# Patient Record
Sex: Female | Born: 1970 | Race: White | Hispanic: No | Marital: Married | Smoking: Never smoker
Health system: Southern US, Community
[De-identification: ages and names within clinical notes are randomized; demographics above are authoritative.]

## PROBLEM LIST (undated history)

## (undated) DIAGNOSIS — F419 Anxiety disorder, unspecified: Secondary | ICD-10-CM

## (undated) DIAGNOSIS — W540XXA Bitten by dog, initial encounter: Secondary | ICD-10-CM

## (undated) DIAGNOSIS — F329 Major depressive disorder, single episode, unspecified: Secondary | ICD-10-CM

## (undated) DIAGNOSIS — J302 Other seasonal allergic rhinitis: Secondary | ICD-10-CM

## (undated) DIAGNOSIS — Z87442 Personal history of urinary calculi: Secondary | ICD-10-CM

## (undated) DIAGNOSIS — E282 Polycystic ovarian syndrome: Secondary | ICD-10-CM

## (undated) DIAGNOSIS — E785 Hyperlipidemia, unspecified: Secondary | ICD-10-CM

## (undated) DIAGNOSIS — M1990 Unspecified osteoarthritis, unspecified site: Secondary | ICD-10-CM

## (undated) DIAGNOSIS — E119 Type 2 diabetes mellitus without complications: Secondary | ICD-10-CM

## (undated) DIAGNOSIS — I10 Essential (primary) hypertension: Secondary | ICD-10-CM

## (undated) DIAGNOSIS — F32A Depression, unspecified: Secondary | ICD-10-CM

## (undated) HISTORY — PX: WISDOM TOOTH EXTRACTION: SHX21

## (undated) HISTORY — DX: Anxiety disorder, unspecified: F41.9

## (undated) HISTORY — DX: Essential (primary) hypertension: I10

## (undated) HISTORY — DX: Type 2 diabetes mellitus without complications: E11.9

## (undated) HISTORY — DX: Hyperlipidemia, unspecified: E78.5

---
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
## 2008-05-09 HISTORY — PX: VAGINA SURGERY: SHX829

## 2014-05-13 DIAGNOSIS — M5430 Sciatica, unspecified side: Secondary | ICD-10-CM | POA: Insufficient documentation

## 2014-06-16 DIAGNOSIS — F339 Major depressive disorder, recurrent, unspecified: Secondary | ICD-10-CM | POA: Insufficient documentation

## 2014-08-22 DIAGNOSIS — E1169 Type 2 diabetes mellitus with other specified complication: Secondary | ICD-10-CM | POA: Insufficient documentation

## 2014-08-22 DIAGNOSIS — E785 Hyperlipidemia, unspecified: Secondary | ICD-10-CM

## 2016-07-21 MED FILL — metFORMIN HCL 850 MG TABS: 850 | 90 days supply | Qty: 180 | Fill #0

## 2016-07-22 DIAGNOSIS — E1169 Type 2 diabetes mellitus with other specified complication: Secondary | ICD-10-CM | POA: Diagnosis not present

## 2016-07-22 DIAGNOSIS — E785 Hyperlipidemia, unspecified: Secondary | ICD-10-CM | POA: Diagnosis not present

## 2016-07-22 DIAGNOSIS — E119 Type 2 diabetes mellitus without complications: Secondary | ICD-10-CM | POA: Diagnosis not present

## 2016-07-27 DIAGNOSIS — E1169 Type 2 diabetes mellitus with other specified complication: Secondary | ICD-10-CM | POA: Diagnosis not present

## 2016-07-27 DIAGNOSIS — I10 Essential (primary) hypertension: Secondary | ICD-10-CM | POA: Diagnosis not present

## 2016-07-27 DIAGNOSIS — E785 Hyperlipidemia, unspecified: Secondary | ICD-10-CM | POA: Diagnosis not present

## 2016-07-27 DIAGNOSIS — E119 Type 2 diabetes mellitus without complications: Secondary | ICD-10-CM | POA: Diagnosis not present

## 2016-07-27 DIAGNOSIS — J329 Chronic sinusitis, unspecified: Secondary | ICD-10-CM | POA: Diagnosis not present

## 2016-07-27 DIAGNOSIS — F339 Major depressive disorder, recurrent, unspecified: Secondary | ICD-10-CM | POA: Diagnosis not present

## 2016-07-27 MED FILL — LORazepam 0.5 MG TABS: 0.5 | 10 days supply | Qty: 30 | Fill #0

## 2016-08-08 MED FILL — MELOXICAM 15 MG TABLET: 15 | 90 days supply | Qty: 90 | Fill #0

## 2016-08-22 MED FILL — ATORVASTATIN 20 MG TABLET: 20 | 90 days supply | Qty: 90 | Fill #0

## 2016-09-13 MED FILL — LISINOPRIL 10 MG TABLET: 10 | 90 days supply | Qty: 90 | Fill #0

## 2016-09-13 MED FILL — glipiZIDE 10 MG TABS: 10 | 90 days supply | Qty: 180 | Fill #0

## 2016-10-31 MED FILL — PARoxetine HCL 20 MG TABS: 20 | 90 days supply | Qty: 90 | Fill #0

## 2016-10-31 MED FILL — metFORMIN HCL 850 MG TABS: 850 | 90 days supply | Qty: 180 | Fill #1

## 2016-11-23 MED FILL — MELOXICAM 15 MG TABLET: 15 | 90 days supply | Qty: 90 | Fill #1

## 2016-11-23 MED FILL — ATORVASTATIN 20 MG TABLET: 20 | 90 days supply | Qty: 90 | Fill #1

## 2017-01-02 MED FILL — LISINOPRIL 10 MG TABLET: 10 | 90 days supply | Qty: 90 | Fill #1

## 2017-01-05 MED FILL — glipiZIDE 10 MG TABS: 10 | 90 days supply | Qty: 180 | Fill #1

## 2017-02-06 DIAGNOSIS — H52223 Regular astigmatism, bilateral: Secondary | ICD-10-CM | POA: Diagnosis not present

## 2017-02-06 DIAGNOSIS — H5213 Myopia, bilateral: Secondary | ICD-10-CM | POA: Diagnosis not present

## 2017-02-06 DIAGNOSIS — H524 Presbyopia: Secondary | ICD-10-CM | POA: Diagnosis not present

## 2017-02-14 MED FILL — metFORMIN HCL 850 MG TABS: 850 | 90 days supply | Qty: 180 | Fill #2

## 2017-02-14 MED FILL — PARoxetine HCL 20 MG TABS: 20 | 90 days supply | Qty: 90 | Fill #1

## 2017-03-07 MED FILL — MELOXICAM 15 MG TABLET: 15 | 90 days supply | Qty: 90 | Fill #2

## 2017-03-22 MED FILL — ATORVASTATIN 20 MG TABLET: 20 | 90 days supply | Qty: 90 | Fill #2

## 2017-04-13 MED FILL — LISINOPRIL 10 MG TABS: 10 | 90 days supply | Qty: 90 | Fill #2

## 2017-04-21 MED FILL — glipiZIDE 10 MG TABS: 10 | 90 days supply | Qty: 180 | Fill #2

## 2017-05-15 MED FILL — metFORMIN HCL 850 MG TABS: 850 | 30 days supply | Qty: 60 | Fill #0

## 2017-05-25 MED FILL — PARoxetine HCL 20 MG TABS: 20 | 90 days supply | Qty: 90 | Fill #2

## 2017-06-06 MED FILL — MELOXICAM 15 MG TABLET: 15 | 90 days supply | Qty: 90 | Fill #3

## 2017-06-13 ENCOUNTER — Ambulatory Visit (INDEPENDENT_AMBULATORY_CARE_PROVIDER_SITE_OTHER): Payer: 59 | Admitting: Family Medicine

## 2017-06-13 ENCOUNTER — Encounter: Payer: Self-pay | Admitting: Family Medicine

## 2017-06-13 ENCOUNTER — Telehealth: Payer: Self-pay | Admitting: Family Medicine

## 2017-06-13 VITALS — BP 132/88 | HR 97 | Ht 65.25 in | Wt 209.9 lb

## 2017-06-13 DIAGNOSIS — Z0000 Encounter for general adult medical examination without abnormal findings: Secondary | ICD-10-CM

## 2017-06-13 DIAGNOSIS — I10 Essential (primary) hypertension: Secondary | ICD-10-CM | POA: Diagnosis not present

## 2017-06-13 DIAGNOSIS — E282 Polycystic ovarian syndrome: Secondary | ICD-10-CM | POA: Diagnosis not present

## 2017-06-13 DIAGNOSIS — R748 Abnormal levels of other serum enzymes: Secondary | ICD-10-CM

## 2017-06-13 DIAGNOSIS — E1159 Type 2 diabetes mellitus with other circulatory complications: Secondary | ICD-10-CM

## 2017-06-13 DIAGNOSIS — E1165 Type 2 diabetes mellitus with hyperglycemia: Secondary | ICD-10-CM | POA: Insufficient documentation

## 2017-06-13 DIAGNOSIS — E559 Vitamin D deficiency, unspecified: Secondary | ICD-10-CM

## 2017-06-13 DIAGNOSIS — F339 Major depressive disorder, recurrent, unspecified: Secondary | ICD-10-CM | POA: Diagnosis not present

## 2017-06-13 DIAGNOSIS — E118 Type 2 diabetes mellitus with unspecified complications: Secondary | ICD-10-CM | POA: Diagnosis not present

## 2017-06-13 DIAGNOSIS — E1169 Type 2 diabetes mellitus with other specified complication: Secondary | ICD-10-CM | POA: Diagnosis not present

## 2017-06-13 DIAGNOSIS — L570 Actinic keratosis: Secondary | ICD-10-CM

## 2017-06-13 DIAGNOSIS — J3089 Other allergic rhinitis: Secondary | ICD-10-CM | POA: Diagnosis not present

## 2017-06-13 DIAGNOSIS — I152 Hypertension secondary to endocrine disorders: Secondary | ICD-10-CM

## 2017-06-13 DIAGNOSIS — M2550 Pain in unspecified joint: Secondary | ICD-10-CM

## 2017-06-13 DIAGNOSIS — E782 Mixed hyperlipidemia: Secondary | ICD-10-CM

## 2017-06-13 DIAGNOSIS — Z8261 Family history of arthritis: Secondary | ICD-10-CM

## 2017-06-13 DIAGNOSIS — X32XXXA Exposure to sunlight, initial encounter: Secondary | ICD-10-CM

## 2017-06-13 NOTE — Telephone Encounter (Signed)
Patient called back about dermatologist preference, she want to see Sydnee Levans at [facility].

## 2017-06-13 NOTE — Patient Instructions (Addendum)
Goals:  for next time when I see you will be:   - you walk the dog for 10 minutes 3 times a day then come back home put the dog in the house and you go walk for an additional 10 minutes yourself 3 times a day for a total of 30 minutes -Check fasting blood sugar at least 3 days a week especially after you eat poorly the night before and 3 days/week check 2-hour postprandial after your largest meal the day especially after you eat bad   Please let me know the dermatologist that your mother goes to whom you would like to be referred to.  Call the office back and speak with Melissa my CMA and she can put in that referral for yearly skin screenings, AK\SK on face.     Risk factors for type 2 diabetes  Researchers don't fully understand why some people develop prediabetes and type 2 diabetes and others don't.  It's clear that certain factors increase the risk, however, including:  Weight. The more fatty tissue you have, the more resistant your cells become to insulin.  Inactivity. The less active you are, the greater your risk. Physical activity helps you control your weight, uses up glucose as energy and makes your cells more sensitive to insulin.  Family history. Your risk increases if a parent or sibling has type 2 diabetes.  Race. Although it's unclear why, people of certain races - including blacks, Hispanics, American Indians and Asian-Americans - are at higher risk.  Age. Your risk increases as you get older. This may be because you tend to exercise less, lose muscle mass and gain weight as you age. But type 2 diabetes is also increasing dramatically among children, adolescents and younger adults.  Gestational diabetes. If you developed gestational diabetes when you were pregnant, your risk of developing prediabetes and type 2 diabetes later increases. If you gave birth to a baby weighing more than 9 pounds (4 kilograms), you're also at risk of type 2 diabetes.  Polycystic ovary syndrome. For  women, having polycystic ovary syndrome - a common condition characterized by irregular menstrual periods, excess hair growth and obesity - increases the risk of diabetes.  High blood pressure. Having blood pressure over 140/90 millimeters of mercury (mm Hg) is linked to an increased risk of type 2 diabetes.  Abnormal cholesterol and triglyceride levels. If you have low levels of high-density lipoprotein (HDL), or "good," cholesterol, your risk of type 2 diabetes is higher. Triglycerides are another type of fat carried in the blood. People with high levels of triglycerides have an increased risk of type 2 diabetes. Your doctor can let you know what your cholesterol and triglyceride levels are.  A good guide to good carbs: The glycemic index ---If you have diabetes, or at risk for diabetes, you know all too well that when you eat carbohydrates, your blood sugar goes up. The total amount of carbs you consume at a meal or in a snack mostly determines what your blood sugar will do. But the food itself also plays a role. A serving of white rice has almost the same effect as eating pure table sugar - a quick, high spike in blood sugar. A serving of lentils has a slower, smaller effect.  ---Picking good sources of carbs can help you control your blood sugar and your weight. Even if you don't have diabetes, eating healthier carbohydrate-rich foods can help ward off a host of chronic conditions, from heart disease to various cancers  to, well, diabetes.  ---One way to choose foods is with the glycemic index (GI). This tool measures how much a food boosts blood sugar.  The glycemic index rates the effect of a specific amount of a food on blood sugar compared with the same amount of pure glucose. A food with a glycemic index of 28 boosts blood sugar only 28% as much as pure glucose. One with a GI of 95 acts like pure glucose.    High glycemic foods result in a quick spike in insulin and blood sugar (also known as  blood glucose).  Low glycemic foods have a slower, smaller effect- these are healthier for you.   Using the glycemic index Using the glycemic index is easy: choose foods in the low GI category instead of those in the high GI category (see below), and go easy on those in between. Low glycemic index (GI of 55 or less): Most fruits and vegetables, beans, minimally processed grains, pasta, low-fat dairy foods, and nuts.  Moderate glycemic index (GI 56 to 69): White and sweet potatoes, corn, white rice, couscous, breakfast cereals such as Cream of Wheat and Mini Wheats.  High glycemic index (GI of 70 or higher): White bread, rice cakes, most crackers, bagels, cakes, doughnuts, croissants, most packaged breakfast cereals. You can see the values for 100 commons foods and get links to more at www.health.CheapToothpicks.si.  Swaps for lowering glycemic index  Instead of this high-glycemic index food Eat this lower-glycemic index food  White rice Brown rice or converted rice  Instant oatmeal Steel-cut oats  Cornflakes Bran flakes  Baked potato Pasta, bulgur  White bread Whole-grain bread  Corn Peas or leafy greens       diabetes Eating Plan  Prediabetes--also called impaired glucose tolerance or impaired fasting glucose--is a condition that causes blood sugar (blood glucose) levels to be higher than normal. Following a healthy diet can help to keep prediabetes under control. It can also help to lower the risk of type 2 diabetes and heart disease, which are increased in people who have prediabetes. Along with regular exercise, a healthy diet:  Promotes weight loss.  Helps to control blood sugar levels.  Helps to improve the way that the body uses insulin.   WHAT DO I NEED TO KNOW ABOUT THIS EATING PLAN?   Use the glycemic index (GI) to plan your meals. The index tells you how quickly a food will raise your blood sugar. Choose low-GI foods. These foods take a longer time to raise blood  sugar.  Pay close attention to the amount of carbohydrates in the food that you eat. Carbohydrates increase blood sugar levels.  Keep track of how many calories you take in. Eating the right amount of calories will help you to achieve a healthy weight. Losing about 7 percent of your starting weight can help to prevent type 2 diabetes.  You may want to follow a Mediterranean diet. This diet includes a lot of vegetables, lean meats or fish, whole grains, fruits, and healthy oils and fats.   WHAT FOODS CAN I EAT?  Grains Whole grains, such as whole-wheat or whole-grain breads, crackers, cereals, and pasta. Unsweetened oatmeal. Bulgur. Barley. Quinoa. Brown rice. Corn or whole-wheat flour tortillas or taco shells. Vegetables Lettuce. Spinach. Peas. Beets. Cauliflower. Cabbage. Broccoli. Carrots. Tomatoes. Squash. Eggplant. Herbs. Peppers. Onions. Cucumbers. Brussels sprouts. Fruits Berries. Bananas. Apples. Oranges. Grapes. Papaya. Mango. Pomegranate. Kiwi. Grapefruit. Cherries. Meats and Other Protein Sources Seafood. Lean meats, such as chicken and [place]  or lean cuts of pork and beef. Tofu. Eggs. Nuts. Beans. Dairy Low-fat or fat-free dairy products, such as yogurt, cottage cheese, and cheese. Beverages Water. Tea. Coffee. Sugar-free or diet soda. Seltzer water. Milk. Milk alternatives, such as soy or almond milk. Condiments Mustard. Relish. Low-fat, low-sugar ketchup. Low-fat, low-sugar barbecue sauce. Low-fat or fat-free mayonnaise. Sweets and Desserts Sugar-free or low-fat pudding. Sugar-free or low-fat ice cream and other frozen treats. Fats and Oils Avocado. Walnuts. Olive oil. The items listed above may not be a complete list of recommended foods or beverages. Contact your dietitian for more options.    WHAT FOODS ARE NOT RECOMMENDED?  Grains Refined white flour and flour products, such as bread, pasta, snack foods, and cereals. Beverages Sweetened drinks, such as sweet iced  tea and soda. Sweets and Desserts Baked goods, such as cake, cupcakes, pastries, cookies, and cheesecake. The items listed above may not be a complete list of foods and beverages to avoid. Contact your dietitian for more information.   This information is not intended to replace advice given to you by your health care provider. Make sure you discuss any questions you have with your health care provider.   Document Released: 09/09/2014 Document Reviewed: 09/09/2014 Elsevier Interactive Patient Education 2016 [employer].  -----------------------------------------------------------------------------------------------------------------------------------------------------------------------------------------  GOALS :  The goals are for the Hgb A1C to be less than 7.0 & blood pressure to be less than 130/80.    It is recommended that all diabetics are educated on and follow a healthy diabetic diet, exercise for 30 minutes 5 times per week (walking, biking, swimming, or machine), monitor blood glucose readings and bring that record with you to be reviewed at your next office visit.     You should be checking fasting blood sugars- especially after you eat poorly or eat really healthy, and also check 2 hour postprandial blood sugars after largest meal of the day.    Write these down and bring in your log at each office visit.    You will need to be seen every 3 months by the provider managing your Diabetes unless told otherwise by that provider.   You will need yearly eye exams from an eye specialist and foot exams to check the nerves of your feet.  Also, your urine should be checked yearly as well to make sure excess protein is not present.   If you are checking your blood pressure at home, please record it and bring it to your next office visit.    Follow the Dietary Approaches to Stop Hypertension (DASH) diet (3 servings of fruit and vegetables daily, whole grains, low sodium, low-fat proteins).   See below.    Lastly, when it comes to your cholesterol, the goal is to have the HDL (good cholesterol) >40, and the LDL (bad cholesterol) <100.   It is recommended that you follow a heart healthy, low saturated and trans-fat diet and exercise for 30 minutes at least 5 times a week.     (( Check out the DASH diet = 1.5 Gram Low Sodium Diet   A 1.5 gram sodium diet restricts the amount of sodium in the diet to no more than 1.5 g or 1500 mg daily.  The American Heart Association recommends Americans over the age of 73 to consume no more than 1500 mg of sodium each day to reduce the risk of developing high blood pressure.  Research also shows that limiting sodium may reduce heart attack and stroke risk.  Many foods contain  sodium for flavor and sometimes as a preservative.  When the amount of sodium in a diet needs to be low, it is important to know what to look for when choosing foods and drinks.  The following includes some information and guidelines to help make it easier for you to adapt to a low sodium diet.    QUICK TIPS  Do not add salt to food.  Avoid convenience items and fast food.  Choose unsalted snack foods.  Buy lower sodium products, often labeled as "lower sodium" or "no salt added."  Check food labels to learn how much sodium is in 1 serving.  When eating at a restaurant, ask that your food be prepared with less salt or none, if possible.    READING FOOD LABELS FOR SODIUM INFORMATION  The nutrition facts label is a good place to find how much sodium is in foods. Look for products with no more than 400 mg of sodium per serving.  Remember that 1.5 g = 1500 mg.  The food label may also list foods as:  Sodium-free: Less than 5 mg in a serving.  Very low sodium: 35 mg or less in a serving.  Low-sodium: 140 mg or less in a serving.  Light in sodium: 50% less sodium in a serving. For example, if a food that usually has 300 mg of sodium is changed to become light in sodium, it will  have 150 mg of sodium.  Reduced sodium: 25% less sodium in a serving. For example, if a food that usually has 400 mg of sodium is changed to reduced sodium, it will have 300 mg of sodium.    CHOOSING FOODS  Grains  Avoid: Salted crackers and snack items. Some cereals, including instant hot cereals. Bread stuffing and biscuit mixes. Seasoned rice or pasta mixes.  Choose: Unsalted snack items. Low-sodium cereals, oats, puffed wheat and rice, shredded wheat. English muffins and bread. Pasta.  Meats  Avoid: Salted, canned, smoked, spiced, pickled meats, including fish and poultry. Bacon, ham, sausage, cold cuts, hot dogs, anchovies.  Choose: Low-sodium canned tuna and salmon. Fresh or frozen meat, poultry, and fish.  Dairy  Avoid: Processed cheese and spreads. Cottage cheese. Buttermilk and condensed milk. Regular cheese.  Choose: Milk. Low-sodium cottage cheese. Yogurt. Sour cream. Low-sodium cheese.  Fruits and Vegetables  Avoid: Regular canned vegetables. Regular canned tomato sauce and paste. Frozen vegetables in sauces. Olives. Angie Fava. Relishes. Sauerkraut.  Choose: Low-sodium canned vegetables. Low-sodium tomato sauce and paste. Frozen or fresh vegetables. Fresh and frozen fruit.  Condiments  Avoid: Canned and packaged gravies. Worcestershire sauce. Tartar sauce. Barbecue sauce. Soy sauce. Steak sauce. Ketchup. Onion, garlic, and table salt. Meat flavorings and tenderizers.  Choose: Fresh and dried herbs and spices. Low-sodium varieties of mustard and ketchup. Lemon juice. Tabasco sauce. Horseradish.    SAMPLE 1.5 GRAM SODIUM MEAL PLAN:   Breakfast / Sodium (mg)  1 cup low-fat milk / 143 mg  1 whole-wheat English muffin / 240 mg  1 tbs heart-healthy margarine / 153 mg  1 hard-boiled egg / 139 mg  1 small orange / 0 mg  Lunch / Sodium (mg)  1 cup raw carrots / 76 mg  2 tbs no salt added peanut butter / 5 mg  2 slices whole-wheat bread / 270 mg  1 tbs jelly / 6 mg   cup red grapes  / 2 mg  Dinner / Sodium (mg)  1 cup whole-wheat pasta / 2 mg  1 cup low-sodium  tomato sauce / 73 mg  3 oz lean ground beef / 57 mg  1 small side salad (1 cup raw spinach leaves,  cup cucumber,  cup yellow bell pepper) with 1 tsp olive oil and 1 tsp red wine vinegar / 25 mg  Snack / Sodium (mg)  1 container low-fat vanilla yogurt / 107 mg  3 graham cracker squares / 127 mg  Nutrient Analysis  Calories: 1745  Protein: 75 g  Carbohydrate: 237 g  Fat: 57 g  Sodium: 1425 mg  Document Released: 04/25/2005 Document Revised: 01/05/2011 Document Reviewed: 07/27/2009  [facility] Patient Information 2012 [place], [place].))

## 2017-06-13 NOTE — Progress Notes (Signed)
New patient office visit note:  Impression and Recommendations:    1. Type 2 diabetes mellitus with complication, without long-term current use of insulin ([place])   2. Hypertension associated with diabetes ([place])   3. Mixed diabetic hyperlipidemia associated with type 2 diabetes mellitus ([place])   4. Depression, recurrent ([place])   5. PCOS (polycystic ovarian syndrome)   6. Vitamin D deficiency   7. Environmental and seasonal allergies   8. Encounter for health maintenance examination in adult   9. Family history of rheumatoid arthritis   10. Arthralgia, unspecified joint- b/l hands and feet   11. SK (solar keratosis)- two on face   12. Elevated liver enzymes- ALT and AST since     Patient stated she prefers to follow-up to discuss  1. Type 2 DM-  Previous A1c was 5.8 on 07-22-2016. A1c was 9 on 05-2014 and 6.4 on 11-2014.  -Goal: keep a1c under 7.0 -Continue meds as prescribed. Will monitor her closely.  -Check fasting blood sugar 3 times a week, especially after eating poorly the night before, also check 3x a week your 2 hour post-prandial, especially after eating poorly. Also check your blood sugar if you have any symptoms.  -Discussed lose-it or myfitness pal apps for tracking your daily food intake.  -Handouts and information provided about the glycemic index of foods.  2. Hypertension with diabetes- Continue taking meds as prescribed. Pt is compliant and tolerating them well. Check your BP at home and better control this at home.  -Goal: keep BP consistently below 130/80s  3. Mixed diabetic hyperlipidemia- Previous labs: 07-23-2016 Total 127, TG 256, HDL 33, VLDL 51, LDL 43.  -Continue taking meds, be sure to take this medication at night instead of in the morning.  4. Depression- Encouraged pt to take better care of herself. Goal: walk the dog for 10 minutes 3x a day, then walk an additional 10 minutes, 3x a day by yourself.  5. PCOS- continue taking meds. Will monitor  closely.  6. Vitamin D deficiency-  Pt is compliant and tolerating this supplement well without complication. -Continue taking supplements as prescribed.  7. Environmental and seasonal allergies- continue taking meds as listed below.  8. Encounter for health maintenance examination in adult- obtain FBW today.  9. FMHx RA: Ambulatory referral giving to rheumatology specialist.  10. Arthralgia- RA-  Pt is requesting ambulatory referral given for future assessment by rheumatology specialist.   11. Solar keratosis- Ambulatory dermatology referral will be given after she determines who the dermatologist her mom sees is. Call the office once you determine this and we will refer you.  12. Elevated LFT: LFT's from 02-2013, AST and ALT were 21 and 22, respectively. Jan 2016 AST was 51 ALT 44. 03-2015, AST 27 and ALT 29. Then most recent, 07-2016, AST 49 and ALT 43. Will discuss this in future OV and see if this correlates with when statins were started.  -FBW and microalbumin urine test obtained today. Follow up in near future to discuss FBW lab results.   -In near future, schedule another, separate appointment to manage chronic care and discuss health care management.   Education and routine counseling performed. Handouts provided.  Orders Placed This Encounter  Procedures  . Comprehensive metabolic panel  . Hemoglobin A1c  . Lipid panel  . TSH  . VITAMIN D 25 Hydroxy (Vit-D Deficiency, Fractures)  . CBC with Differential/Platelet  . Ambulatory referral to Rheumatology  . POCT UA - Microalbumin  No orders of the defined types were placed in this encounter.   Gross side effects, risk and benefits, and alternatives of medications discussed with patient.  Patient is aware that all medications have potential side effects and we are unable to predict every side effect or drug-drug interaction that may occur.  Expresses verbal understanding and consents to current therapy plan and treatment  regimen.  Return for Follow-up in near future to discuss blood work chronic medical problems-diabetes, cholesterol; then .  Please see AVS handed out to patient at the end of our visit for further patient instructions/ counseling done pertaining to today's office visit.    Note: This document was prepared using Dragon voice recognition software and may include unintentional dictation errors.  This document serves as a record of services personally performed by [name], DO. It was created on her behalf by Mayer Masker, a trained medical scribe. The creation of this record is based on the scribe's personal observations and the provider's statements to them.   I have reviewed the above medical documentation for accuracy and completeness and I concur.  [name] 06/19/17 1:33 PM  ----------------------------------------------------------------------------------------------------------------------    Subjective:    Chief complaint:   Chief Complaint  Patient presents with  . Establish Care   HPI: Erin Marquez is a pleasant 47 y.o. female who presents to [place] [facility] today to review their medical history with me and establish care.   I asked the patient to review their chronic problem list with me to ensure everything was updated and accurate.    All recent office visits with other providers, any medical records that patient brought in etc  - I reviewed today.     We asked pt to get [place] their medical records from [facility] providers/ specialists that they had seen within the past 3-5 years- if they are in private practice and/or do not work for [employer], [facility], [place], [place] or [employer] owned practice.  Told them to call their specialists to clarify this if they are not sure.   Other healthcare providers: Dr. Levy Pupa at [facility] was her previous PCP.   Nov 2016 last pap smear was [place] Bailiff, gynecology, at [facility] in  [place], [place].  Personal information She is a stay at home mom. She states in the new year she is going to dedicate more time for herself. She states she does not take care of herself. She exercises 10 minutes a day 3x a day by walking her dog.   PMHx: Derm: She is concerned about a skin condition on her face.  She has a suspected h/o RA and states she has pain in her hands and feet. She has a FMHx of RA. She had a negative RA test done recently. She states she usually takes Mobic for her joint pain. She states she occasionally has difficulty sleeping due to joint pains.  PCOS- she is on metformin for this.  DM2: started new meds in addition to metformin when diagnosed with DM 2 years ago, also she had gestational diabetes in 2006. She does not check her blood sugars at home but has a glucometer. Previous A1C's: Last A1c was 5.8 on 07-22-2016. A1c was 9 on 05-2014 and 6.4 on 11-2014.  Cholesterol  She takes daily lipitor. Previous labs: 07-23-2016 Total 127, TG 256, HDL 33, VLDL 51, LDL 43.   LFT:  LFT's from 02-2013, AST and ALT were 21 and 22, respectively.  Jan 2016 AST was 51 ALT 44. 03-2015, AST 27 and ALT 29. Then most recent, 07-2016, AST 49 and ALT 43.   Wt Readings from Last 3 Encounters:  06/13/17 209 lb 14.4 oz (95.2 kg)   BP Readings from Last 3 Encounters:  06/13/17 132/88   Pulse Readings from Last 3 Encounters:  06/13/17 97   BMI Readings from Last 3 Encounters:  06/13/17 34.66 kg/m    Patient Care Team    Relationship Specialty Notifications Start End  [name], DO PCP - General Family Medicine  06/12/17     Patient Active Problem List   Diagnosis Date Noted  . Type 2 diabetes mellitus with complication, without long-term current use of insulin ([place]) 06/13/2017    Priority: High  . Hypertension associated with diabetes ([place]) 06/13/2017    Priority: High  . Mixed diabetic hyperlipidemia associated with type 2 diabetes mellitus ([place]) 06/13/2017     Priority: High  . Depression, recurrent ([place]) 06/16/2014    Priority: High  . PCOS (polycystic ovarian syndrome) 06/13/2017    Priority: Medium  . Vitamin D deficiency 06/13/2017    Priority: Medium  . Chronic Arthralgias- bilateral hands and feet 06/13/2017    Priority: Medium  . Family history of rheumatoid arthritis 06/13/2017    Priority: Medium  . Environmental and seasonal allergies 06/13/2017    Priority: Low  . SK (solar keratosis)- two on face 06/13/2017    Priority: Low  . Elevated liver enzymes- ALT and AST since  06/13/2017  . Sciatica 05/13/2014     Past Medical History:  Diagnosis Date  . Diabetes mellitus without complication ([place])   . Hyperlipidemia   . Hypertension      Past Medical History:  Diagnosis Date  . Diabetes mellitus without complication ([place])   . Hyperlipidemia   . Hypertension      Past Surgical History:  Procedure Laterality Date  . WISDOM TOOTH EXTRACTION       Family History  Problem Relation Age of Onset  . Hypertension Mother   . Stroke Mother   . Diabetes Father      Social History   Substance and Sexual Activity  Drug Use No     Social History   Substance and Sexual Activity  Alcohol Use Yes  . Alcohol/week: 0.6 oz  . Types: 1 Standard drinks or equivalent per week     Social History   Tobacco Use  Smoking Status Never Smoker  Smokeless Tobacco Never Used     Current Meds  Medication Sig  . acetaminophen (TYLENOL) 650 MG CR tablet Take 1 tablet by mouth 2 (two) times daily.  Marland Kitchen atorvastatin (LIPITOR) 20 MG tablet Take 1 tablet by mouth at bedtime.  . Cholecalciferol (VITAMIN D3) 5000 units CAPS Take 1 capsule by mouth daily.  . fexofenadine (ALLEGRA) 180 MG tablet Take 180 mg by mouth daily.  Marland Kitchen glipiZIDE (GLUCOTROL) 10 MG tablet Take 1 tablet by mouth 2 (two) times daily.  Marland Kitchen lisinopril (PRINIVIL,ZESTRIL) 10 MG tablet Take 1 tablet by mouth daily.  . meloxicam (MOBIC) 15 MG tablet Take 1 tablet by  mouth daily.  . metFORMIN (GLUCOPHAGE) 850 MG tablet Take 1 tablet by mouth 2 (two) times daily.  . Multiple Vitamins-Minerals (WOMENS MULTIVITAMIN PO) Take 1 tablet by mouth 2 (two) times daily.  . norethindrone (AYGESTIN) 5 MG tablet Take 5 mg by mouth daily. For 10 days to start cycle  . Omega-3 Fatty Acids (FISH OIL) 1200 MG CAPS  Take 1 capsule by mouth daily.  Marland Kitchen PARoxetine (PAXIL) 20 MG tablet Take 1 tablet by mouth daily.  . vitamin B-12 (CYANOCOBALAMIN) 100 MCG tablet Take 1 capsule by mouth daily.    Allergies: Codeine and Peanut oil   Review of Systems  Constitutional: Negative for chills, diaphoresis, fever, malaise/fatigue and weight loss.  HENT: Negative for congestion, sore throat and tinnitus.   Eyes: Negative for blurred vision, double vision and photophobia.  Respiratory: Negative for cough and wheezing.   Cardiovascular: Negative for chest pain and palpitations.  Gastrointestinal: Negative for blood in stool, diarrhea, nausea and vomiting.  Genitourinary: Negative for dysuria, frequency and urgency.  Musculoskeletal: Negative for joint pain and myalgias.  Skin: Negative for itching and rash.  Neurological: Negative for dizziness, focal weakness, weakness and headaches.  Endo/Heme/Allergies: Negative for environmental allergies and polydipsia. Does not bruise/bleed easily.  Psychiatric/Behavioral: Negative for depression and memory loss. The patient is not nervous/anxious and does not have insomnia.      Objective:   Blood pressure 132/88, pulse 97, height 5' 5.25" (1.657 m), weight 209 lb 14.4 oz (95.2 kg). Body mass index is 34.66 kg/m. General: Well Developed, well nourished, and in no acute distress.  Neuro: Alert and oriented x3, extra-ocular muscles intact, sensation grossly intact.  HEENT:[place]/AT, PERRLA, neck supple, No carotid bruits Skin: no gross rashes. R eye: small hyperkeratotic 4 mm x 1.5 mm skin lesion, slightly hyperpigmented with dry peeling skin  appearance. R cheek: new slightly hyperkeratotic lesion, dry appearance, 3 mm x 3.5 mm, slightly hyperpigmented. Cardiac: Regular rate and rhythm Respiratory: Essentially clear to auscultation bilaterally. Not using accessory muscles, speaking in full sentences.  Abdominal: not grossly distended Musculoskeletal: Ambulates w/o diff, FROM * 4 ext.  Vasc: less 2 sec cap RF, warm and pink  Psych:  No HI/SI, judgement and insight good, Euthymic mood. Full Affect.    Recent Results (from the past 2160 hour(s))  Comprehensive metabolic panel     Status: Abnormal   Collection Time: 06/13/17  5:35 PM  Result Value Ref Range   Glucose 166 (H) 65 - 99 mg/dL   BUN 14 6 - 24 mg/dL   Creatinine, Ser 0.94 0.57 - 1.00 mg/dL   GFR calc non Af Amer 74 >59 mL/min/1.73   GFR calc Af Amer 85 >59 mL/min/1.73   BUN/Creatinine Ratio 15 9 - 23   Sodium 136 134 - 144 mmol/L   Potassium 4.7 3.5 - 5.2 mmol/L   Chloride 97 96 - 106 mmol/L   CO2 19 (L) 20 - 29 mmol/L   Calcium 9.5 8.7 - 10.2 mg/dL   Total Protein 7.6 6.0 - 8.5 g/dL   Albumin 4.6 3.5 - 5.5 g/dL   Globulin, Total 3.0 1.5 - 4.5 g/dL   Albumin/Globulin Ratio 1.5 1.2 - 2.2   Bilirubin Total 0.8 0.0 - 1.2 mg/dL   Alkaline Phosphatase 43 39 - 117 IU/L   AST 54 (H) 0 - 40 IU/L   ALT 51 (H) 0 - 32 IU/L  Hemoglobin A1c     Status: Abnormal   Collection Time: 06/13/17  5:35 PM  Result Value Ref Range   Hgb A1c MFr Bld 8.7 (H) 4.8 - 5.6 %    Comment:          Prediabetes: 5.7 - 6.4          Diabetes: >6.4          Glycemic control for adults with diabetes: <7.0    Est.  average glucose Bld gHb Est-mCnc 203 mg/dL  Lipid panel     Status: Abnormal   Collection Time: 06/13/17  5:35 PM  Result Value Ref Range   Cholesterol, Total 151 100 - 199 mg/dL   Triglycerides 214 (H) 0 - 149 mg/dL   HDL 40 >39 mg/dL   VLDL Cholesterol Cal 43 (H) 5 - 40 mg/dL   LDL Calculated 68 0 - 99 mg/dL   Chol/HDL Ratio 3.8 0.0 - 4.4 ratio    Comment:                                    T. Chol/HDL Ratio                                             Men  Women                               1/2 Avg.Risk  3.4    3.3                                   Avg.Risk  5.0    4.4                                2X Avg.Risk  9.6    7.1                                3X Avg.Risk 23.4   11.0   TSH     Status: None   Collection Time: 06/13/17  5:35 PM  Result Value Ref Range   TSH 2.020 0.450 - 4.500 uIU/mL  VITAMIN D 25 Hydroxy (Vit-D Deficiency, Fractures)     Status: None   Collection Time: 06/13/17  5:35 PM  Result Value Ref Range   Vit D, 25-Hydroxy 65.4 30.0 - 100.0 ng/mL    Comment: Vitamin D deficiency has been defined by the [place] practice guideline as a level of serum 25-OH vitamin D less than 20 ng/mL (1,2). The Endocrine Society went on to further define vitamin D insufficiency as a level between 21 and 29 ng/mL (2). 1. IOM (Institute of Medicine). 2010. Dietary reference    intakes for calcium and D. [place]: The    [employer]. 2. Holick MF, Binkley Holbrook, Bischoff-Ferrari HA, et al.    Evaluation, treatment, and prevention of vitamin D    deficiency: an Endocrine Society clinical practice    guideline. JCEM. 2011 Jul; 96(7):1911-30.   CBC with Differential/Platelet     Status: Abnormal   Collection Time: 06/13/17  5:35 PM  Result Value Ref Range   WBC 9.0 3.4 - 10.8 x10E3/uL   RBC 4.15 3.77 - 5.28 x10E6/uL   Hemoglobin 13.1 11.1 - 15.9 g/dL   Hematocrit 38.5 34.0 - 46.6 %   MCV 93 79 - 97 fL   MCH 31.6 26.6 - 33.0 pg   MCHC 34.0 31.5 - 35.7 g/dL   RDW 13.2 12.3 - 15.4 %   Platelets 245  150 - 379 x10E3/uL   Neutrophils 46 Not Estab. %   Lymphs 48 Not Estab. %   Monocytes 4 Not Estab. %   Eos 2 Not Estab. %   Basos 0 Not Estab. %   Neutrophils Absolute 4.1 1.4 - 7.0 x10E3/uL   Lymphocytes Absolute 4.2 (H) 0.7 - 3.1 x10E3/uL   Monocytes Absolute 0.4 0.1 - 0.9 x10E3/uL   EOS (ABSOLUTE)  0.2 0.0 - 0.4 x10E3/uL   Basophils Absolute 0.0 0.0 - 0.2 x10E3/uL   Immature Granulocytes 0 Not Estab. %   Immature Grans (Abs) 0.0 0.0 - 0.1 x10E3/uL  POCT UA - Microalbumin     Status: Normal   Collection Time: 06/14/17  5:15 PM  Result Value Ref Range   Microalbumin Ur, POC 10 mg/L   Creatinine, POC 300 mg/dL   Albumin/Creatinine Ratio, Urine, POC <30

## 2017-06-14 ENCOUNTER — Other Ambulatory Visit: Payer: Self-pay

## 2017-06-14 DIAGNOSIS — L570 Actinic keratosis: Secondary | ICD-10-CM

## 2017-06-14 DIAGNOSIS — X32XXXA Exposure to sunlight, initial encounter: Principal | ICD-10-CM

## 2017-06-14 LAB — COMPREHENSIVE METABOLIC PANEL
ALK PHOS: 43 IU/L (ref 39–117)
ALT: 51 IU/L — AB (ref 0–32)
AST: 54 IU/L — AB (ref 0–40)
Albumin/Globulin Ratio: 1.5 (ref 1.2–2.2)
Albumin: 4.6 g/dL (ref 3.5–5.5)
BILIRUBIN TOTAL: 0.8 mg/dL (ref 0.0–1.2)
BUN/Creatinine Ratio: 15 (ref 9–23)
BUN: 14 mg/dL (ref 6–24)
CHLORIDE: 97 mmol/L (ref 96–106)
CO2: 19 mmol/L — ABNORMAL LOW (ref 20–29)
Calcium: 9.5 mg/dL (ref 8.7–10.2)
Creatinine, Ser: 0.94 mg/dL (ref 0.57–1.00)
GFR calc Af Amer: 85 mL/min/{1.73_m2} (ref >59)
GFR calc non Af Amer: 74 mL/min/{1.73_m2} (ref >59)
GLUCOSE: 166 mg/dL — AB (ref 65–99)
Globulin, Total: 3 g/dL (ref 1.5–4.5)
Potassium: 4.7 mmol/L (ref 3.5–5.2)
Sodium: 136 mmol/L (ref 134–144)
TOTAL PROTEIN: 7.6 g/dL (ref 6.0–8.5)

## 2017-06-14 LAB — POCT UA - MICROALBUMIN
CREATININE, POC: 300 mg/dL
MICROALBUMIN (UR) POC: 10 mg/L

## 2017-06-14 LAB — LIPID PANEL
CHOLESTEROL TOTAL: 151 mg/dL (ref 100–199)
Chol/HDL Ratio: 3.8 ratio (ref 0.0–4.4)
HDL: 40 mg/dL (ref >39)
LDL Calculated: 68 mg/dL (ref 0–99)
TRIGLYCERIDES: 214 mg/dL — AB (ref 0–149)
VLDL Cholesterol Cal: 43 mg/dL — ABNORMAL HIGH (ref 5–40)

## 2017-06-14 LAB — CBC WITH DIFFERENTIAL/PLATELET
BASOS ABS: 0 10*3/uL (ref 0.0–0.2)
Basos: 0 %
EOS (ABSOLUTE): 0.2 10*3/uL (ref 0.0–0.4)
EOS: 2 %
HEMATOCRIT: 38.5 % (ref 34.0–46.6)
HEMOGLOBIN: 13.1 g/dL (ref 11.1–15.9)
Immature Grans (Abs): 0 10*3/uL (ref 0.0–0.1)
Immature Granulocytes: 0 %
Lymphocytes Absolute: 4.2 10*3/uL — ABNORMAL HIGH (ref 0.7–3.1)
Lymphs: 48 %
MCH: 31.6 pg (ref 26.6–33.0)
MCHC: 34 g/dL (ref 31.5–35.7)
MCV: 93 fL (ref 79–97)
MONOCYTES: 4 %
Monocytes Absolute: 0.4 10*3/uL (ref 0.1–0.9)
NEUTROS PCT: 46 %
Neutrophils Absolute: 4.1 10*3/uL (ref 1.4–7.0)
Platelets: 245 10*3/uL (ref 150–379)
RBC: 4.15 x10E6/uL (ref 3.77–5.28)
RDW: 13.2 % (ref 12.3–15.4)
WBC: 9 10*3/uL (ref 3.4–10.8)

## 2017-06-14 LAB — HEMOGLOBIN A1C
ESTIMATED AVERAGE GLUCOSE: 203 mg/dL
Hgb A1c MFr Bld: 8.7 % — ABNORMAL HIGH (ref 4.8–5.6)

## 2017-06-14 LAB — TSH: TSH: 2.02 u[IU]/mL (ref 0.450–4.500)

## 2017-06-14 LAB — VITAMIN D 25 HYDROXY (VIT D DEFICIENCY, FRACTURES): Vit D, 25-Hydroxy: 65.4 ng/mL (ref 30.0–100.0)

## 2017-06-14 NOTE — Telephone Encounter (Signed)
Referral placed for patient. MPulliam, CMA/RT(R)  

## 2017-06-14 NOTE — Progress Notes (Signed)
Per Dr. Hershal Coria instruction placed referral to Dermatology - patient's preference to which doctor. MPulliam, CMA/RT(R)

## 2017-06-19 NOTE — Progress Notes (Deleted)
Impression and Recommendations:    1. Type 2 diabetes mellitus with complication, without long-term current use of insulin ([place])   2. Hypertension associated with diabetes ([place])   3. Mixed diabetic hyperlipidemia associated with type 2 diabetes mellitus ([place])   4. Depression, recurrent ([place])   5. PCOS (polycystic ovarian syndrome)   6. Vitamin D deficiency   7. Environmental and seasonal allergies   8. Encounter for health maintenance examination in adult   9. Family history of rheumatoid arthritis   10. Arthralgia, unspecified joint- b/l hands and feet   11. SK (solar keratosis)- two on face   12. Elevated liver enzymes- ALT and AST since      1. ***   Education and routine counseling performed. Handouts provided.  Orders Placed This Encounter  Procedures  . Comprehensive metabolic panel  . Hemoglobin A1c  . Lipid panel  . TSH  . VITAMIN D 25 Hydroxy (Vit-D Deficiency, Fractures)  . CBC with Differential/Platelet  . Ambulatory referral to Rheumatology  . POCT UA - Microalbumin    No orders of the defined types were placed in this encounter.    The patient was counseled, risk factors were discussed, anticipatory guidance given.  Gross side effects, risk and benefits, and alternatives of medications discussed with patient.  Patient is aware that all medications have potential side effects and we are unable to predict every side effect or drug-drug interaction that may occur.  Expresses verbal understanding and consents to current therapy plan and treatment regimen.  Return for Follow-up in near future to discuss blood work chronic medical problems-diabetes, cholesterol; then .  Please see AVS handed out to patient at the end of our visit for further patient instructions/ counseling done pertaining to today's office visit.    Note: This document was prepared using Dragon voice recognition software and may include unintentional dictation errors.    Erin Marquez 06/19/17 1:34 PM   Subjective:    HPI: Erin Marquez is a 47 y.o. female who presents to [place] at [facility] today for follow up for HTN.      HTN:  -  Her blood pressure {HAS HAS NOT:18834} been controlled at home.   - Patient reports good compliance with blood pressure medications  - Denies medication S-E   - Smoking Status noted   - She denies new onset of: chest pain, exercise intolerance, shortness of breath, dizziness, visual changes, headache, lower extremity swelling or claudication.    Last 3 blood pressure readings in our office are as follows: BP Readings from Last 3 Encounters:  06/13/17 132/88    Pulse Readings from Last 3 Encounters:  06/13/17 97    Filed Weights   06/13/17 1124  Weight: 209 lb 14.4 oz (95.2 kg)      Patient Care Team    Relationship Specialty Notifications Start End  Erin Dance, DO PCP - General Family Medicine  06/12/17      Lab Results  Component Value Date   CREATININE 0.94 06/13/2017   BUN 14 06/13/2017   NA 136 06/13/2017   K 4.7 06/13/2017   CL 97 06/13/2017   CO2 19 (L) 06/13/2017    Lab Results  Component Value Date   CHOL 151 06/13/2017    Lab Results  Component Value Date   HDL 40 06/13/2017    Lab Results  Component Value Date   LDLCALC 68 06/13/2017    Lab Results  Component Value  Date   TRIG 214 (H) 06/13/2017    Lab Results  Component Value Date   CHOLHDL 3.8 06/13/2017    No results found for: LDLDIRECT ===================================================================   Patient Active Problem List   Diagnosis Date Noted  . Type 2 diabetes mellitus with complication, without long-term current use of insulin ([place]) 06/13/2017    Priority: High  . Hypertension associated with diabetes ([place]) 06/13/2017    Priority: High  . Mixed diabetic hyperlipidemia associated with type 2 diabetes mellitus ([place]) 06/13/2017    Priority: High  . Depression, recurrent  ([place]) 06/16/2014    Priority: High  . PCOS (polycystic ovarian syndrome) 06/13/2017    Priority: Medium  . Vitamin D deficiency 06/13/2017    Priority: Medium  . Chronic Arthralgias- bilateral hands and feet 06/13/2017    Priority: Medium  . Family history of rheumatoid arthritis 06/13/2017    Priority: Medium  . Environmental and seasonal allergies 06/13/2017    Priority: Low  . SK (solar keratosis)- two on face 06/13/2017    Priority: Low  . Elevated liver enzymes- ALT and AST since  06/13/2017  . Sciatica 05/13/2014     Past Medical History:  Diagnosis Date  . Diabetes mellitus without complication ([place])   . Hyperlipidemia   . Hypertension      Past Surgical History:  Procedure Laterality Date  . WISDOM TOOTH EXTRACTION       Family History  Problem Relation Age of Onset  . Hypertension Mother   . Stroke Mother   . Diabetes Father      Social History   Substance and Sexual Activity  Drug Use No  ,  Social History   Substance and Sexual Activity  Alcohol Use Yes  . Alcohol/week: 0.6 oz  . Types: 1 Standard drinks or equivalent per week  ,  Social History   Tobacco Use  Smoking Status Never Smoker  Smokeless Tobacco Never Used  ,    Current Outpatient Medications on File Prior to Visit  Medication Sig Dispense Refill  . acetaminophen (TYLENOL) 650 MG CR tablet Take 1 tablet by mouth 2 (two) times daily.    Marland Kitchen atorvastatin (LIPITOR) 20 MG tablet Take 1 tablet by mouth at bedtime.  3  . Cholecalciferol (VITAMIN D3) 5000 units CAPS Take 1 capsule by mouth daily.    . fexofenadine (ALLEGRA) 180 MG tablet Take 180 mg by mouth daily.    Marland Kitchen glipiZIDE (GLUCOTROL) 10 MG tablet Take 1 tablet by mouth 2 (two) times daily.    Marland Kitchen lisinopril (PRINIVIL,ZESTRIL) 10 MG tablet Take 1 tablet by mouth daily.  3  . meloxicam (MOBIC) 15 MG tablet Take 1 tablet by mouth daily.  3  . metFORMIN (GLUCOPHAGE) 850 MG tablet Take 1 tablet by mouth 2 (two) times daily.  0  .  Multiple Vitamins-Minerals (WOMENS MULTIVITAMIN PO) Take 1 tablet by mouth 2 (two) times daily.    . norethindrone (AYGESTIN) 5 MG tablet Take 5 mg by mouth daily. For 10 days to start cycle    . Omega-3 Fatty Acids (FISH OIL) 1200 MG CAPS Take 1 capsule by mouth daily.    Marland Kitchen PARoxetine (PAXIL) 20 MG tablet Take 1 tablet by mouth daily.  3  . vitamin B-12 (CYANOCOBALAMIN) 100 MCG tablet Take 1 capsule by mouth daily.     No current facility-administered medications on file prior to visit.      Allergies  Allergen Reactions  . Codeine Nausea Only  . Peanut Oil  Slurred speech     Review of Systems:   General:  Denies fever, chills Optho/Auditory:   Denies visual changes, blurred vision Respiratory:   Denies SOB, cough, wheeze, DIB  Cardiovascular:   Denies chest pain, palpitations, painful respirations Gastrointestinal:   Denies nausea, vomiting, diarrhea.  Endocrine:     Denies new hot or cold intolerance Musculoskeletal:  Denies joint swelling, gait issues, or new unexplained myalgias/ arthralgias Skin:  Denies rash, suspicious lesions  Neurological:    Denies dizziness, unexplained weakness, numbness  Psychiatric/Behavioral:   Denies mood changes  Objective:    Blood pressure 132/88, pulse 97, height 5' 5.25" (1.657 m), weight 209 lb 14.4 oz (95.2 kg).  Body mass index is 34.66 kg/m.  General: Well Developed, well nourished, and in no acute distress.  HEENT: Normocephalic, atraumatic, pupils equal round reactive to light, neck supple, No carotid bruits, no JVD Skin: Warm and dry, cap RF less 2 sec Cardiac: Regular rate and rhythm, S1, S2 WNL's, no murmurs rubs or gallops Respiratory: ECTA B/L, Not using accessory muscles, speaking in full sentences. NeuroM-Sk: Ambulates w/o assistance, moves ext * 4 w/o difficulty, sensation grossly intact.  Ext: scant edema b/l lower ext Psych: No HI/SI, judgement and insight good, Euthymic mood. Full Affect.

## 2017-06-22 DIAGNOSIS — Z01419 Encounter for gynecological examination (general) (routine) without abnormal findings: Secondary | ICD-10-CM | POA: Diagnosis not present

## 2017-06-22 DIAGNOSIS — E669 Obesity, unspecified: Secondary | ICD-10-CM | POA: Diagnosis not present

## 2017-06-26 MED FILL — ATORVASTATIN 20 MG TABLET: 20 | 90 days supply | Qty: 90 | Fill #3

## 2017-07-03 ENCOUNTER — Ambulatory Visit: Payer: 59 | Admitting: Family Medicine

## 2017-07-05 ENCOUNTER — Encounter: Payer: Self-pay | Admitting: Family Medicine

## 2017-07-05 ENCOUNTER — Ambulatory Visit (INDEPENDENT_AMBULATORY_CARE_PROVIDER_SITE_OTHER): Payer: 59 | Admitting: Family Medicine

## 2017-07-05 VITALS — BP 122/84 | HR 100 | Ht 65.25 in | Wt 210.0 lb

## 2017-07-05 DIAGNOSIS — M2550 Pain in unspecified joint: Secondary | ICD-10-CM | POA: Diagnosis not present

## 2017-07-05 DIAGNOSIS — R748 Abnormal levels of other serum enzymes: Secondary | ICD-10-CM | POA: Diagnosis not present

## 2017-07-05 DIAGNOSIS — I152 Hypertension secondary to endocrine disorders: Secondary | ICD-10-CM

## 2017-07-05 DIAGNOSIS — E559 Vitamin D deficiency, unspecified: Secondary | ICD-10-CM

## 2017-07-05 DIAGNOSIS — I10 Essential (primary) hypertension: Secondary | ICD-10-CM

## 2017-07-05 DIAGNOSIS — E1159 Type 2 diabetes mellitus with other circulatory complications: Secondary | ICD-10-CM | POA: Diagnosis not present

## 2017-07-05 DIAGNOSIS — F339 Major depressive disorder, recurrent, unspecified: Secondary | ICD-10-CM

## 2017-07-05 DIAGNOSIS — E118 Type 2 diabetes mellitus with unspecified complications: Secondary | ICD-10-CM

## 2017-07-05 DIAGNOSIS — E782 Mixed hyperlipidemia: Secondary | ICD-10-CM | POA: Diagnosis not present

## 2017-07-05 DIAGNOSIS — E1169 Type 2 diabetes mellitus with other specified complication: Secondary | ICD-10-CM

## 2017-07-05 MED ORDER — METFORMIN HCL 1000 MG PO TABS: 1000.0000 mg | ORAL_TABLET | Freq: Two times a day (BID) | ORAL | 1 refills | Status: DC

## 2017-07-05 MED ORDER — PAROXETINE HCL 20 MG PO TABS: 20.0000 mg | ORAL_TABLET | Freq: Every day | ORAL | 1 refills | Status: DC

## 2017-07-05 MED ORDER — FISH OIL 1200 MG PO CAPS: ORAL_CAPSULE | ORAL | Status: DC

## 2017-07-05 MED ORDER — LISINOPRIL 10 MG PO TABS: 10.0000 mg | ORAL_TABLET | Freq: Every day | ORAL | 1 refills | Status: DC

## 2017-07-05 MED ORDER — GLIPIZIDE 10 MG PO TABS: 10.0000 mg | ORAL_TABLET | Freq: Two times a day (BID) | ORAL | 1 refills | Status: DC

## 2017-07-05 MED ORDER — MELOXICAM 15 MG PO TABS: 15.0000 mg | ORAL_TABLET | Freq: Every day | ORAL | 1 refills | Status: DC

## 2017-07-05 MED FILL — LISINOPRIL 10 MG TABS: 10 | 90 days supply | Qty: 90 | Fill #0

## 2017-07-05 MED FILL — metFORMIN HCL 1000 MG TABS: 1000 | 90 days supply | Qty: 180 | Fill #0

## 2017-07-05 NOTE — Progress Notes (Signed)
Assessment and plan:  1. Type 2 diabetes mellitus with complication, without long-term current use of insulin ([place])   2. Mixed diabetic hyperlipidemia associated with type 2 diabetes mellitus ([place])   3. Hypertension associated with diabetes ([place])   4. Depression, recurrent ([place])   5. Vitamin D deficiency   6. Elevated liver enzymes- ALT and AST since    7. Arthralgia, unspecified joint     1.  Review Fasting Blood Work - A1C Historical Values 3 weeks ago - 8.7. 5.8 on 07/22/2016. 9.0 in January 2016. 6.4 in July 2016.  - Diabetes - Changed dosage of metformin from 850 to 1000 BID. - Check your fasting blood sugars especially after you eat good and bad the night before.  As well 2-hour postprandial which is 2 hours after your largest meal I want you to check as well.  Again only after you eat especially healthy or specially poorly.  This will give you a very good positive and negative feedback to help encourage you to a healthier way of living -Goal fasting blood sugars should be under 130 and the 2 hours after largest meal should be goal under 180.  [email or web address].heart.org  - If better control can be established, we may be able to take her off of the glucotrol in the future.  - Reviewed that goal A1C is less than 7.0. - Advised patient that greater than 7 can cause damage to kidneys, pancreas, heart, eyes, nerves.  - Monk fruit sweetener and erythritol products advised as a sugar substitute.  - Re-check A1C and glucose in 9 weeks, around April 10th 2019.  - Patient should bring a log of blood glucose to next appointment.  She must check her blood sugar, fasting and 2 hours post-prandial.  Emphasized that the patient should try to measure her fasting blood sugar after she eats something particularly bad, or particularly good.  This will give her a good positive and negative feedback to help encourage her toward a healthier  way of living.  - Goal fasting blood sugars reviewed as under 130, and 2 hour post-prandial goal should be under 180.  - Kidneys - Proteinuria in normal range - less than 30. - Will continue to monitor yearly.  - Advised that to prevent protein spilled in the urine, kidney damage should be prevented.  Control blood pressure, control blood sugars, exercise.  - Electrolytes are good.  Overall good kidney health.    - Strongly emphasized the importance of drinking enough water.  - Liver Enzymes - Liver enzymes stable. - Advised to stop habitually taking tylenol every night.  Reviewed that tylenol can strongly affect the liver, and this is not ideal given her management of cholesterol on statins.  - Reviewed arthritis creams and other anti-inflammatory alternatives to tylenol; gels, patches, penetrex; meditation for pain. - Advised that exercise and weight loss with help alleviate her pain.  - Will continue to monitor.  - HLD - Continue taking atorvastatin at night. - Continue taking fish oil, at least 3-4 grams per day.  2 caps twice daily (4 caps per day). - Advised patient to look up fish oil on [facility] to review benefits and appropriate use of fish oil. - Reviewed the need for fish oil to contain EPA & DHA.  Triglycerides = 214 - Minimize fatty carbs - french toast, donuts, biscuits; anything with fat along with carbs. - Guidelines on triglycerides provided to the patient today.  HDL = 40 -  Reviewed that exercise, as well as healthier fats, can help improve HDL numbers.  LDL = 69 - Reviewed that goal LDL is less than 100.  Well controlled on medicine. - Handout provided on saturated and trans fats, to review nutrition of food. - Good to have this value under 70 given her family history.  - Advised the patient that some foods have "hidden cholesterol" even if they are listed as "cholesterol free;" this is because saturated and trans fats become cholesterol when they hit  the body.  - Current ASCVD Risk: The 10-year ASCVD risk score Mikey Bussing DC Jr., et al., 2013) is: 1.8%   Values used to calculate the score:     Age: 47 years     Sex: Female     Is Non-Hispanic African American: No     Diabetic: Yes     Tobacco smoker: No     Systolic Blood Pressure: 008 mmHg     Is BP treated: Yes     HDL Cholesterol: 40 mg/dL     Total Cholesterol: 151 mg/dL  - Will continue to monitor cholesterol.  - TSH - Looks good.  Will continue to monitor.  - Vitamin D - Continue with supplements - D3 5000.  - Blood Counts - Healthy counts at present.  Will continue to monitor.  2. Refills - Patient will continue on medications as prescribed, and changes as indicated. Paxil refilled. Lisinopril refilled. Glypizide refilled.  3. Health Maintenance - Given her concerns about decreased libido, advised the patient that exercising, eating better, controlling her diabetes, and managing her other health issues will help improve this.  If she successfully manages her health issues with exercise and dietary changes, she may eventually consider going off of the Paxil.  - Patient should make sure to consume adequate amounts of water - half of body weight in oz of water per day  - Attend scheduled health maintenance appointments made with specialties, RA April 4th and with Dermatology May 1st 2019.  - Continue with beneficial lifestyle changes.  Advised the patient to keep looking forward, and not looking back - practicing directed self love and encouraging herself to maintain her new habits.  - Continue walking her dog 10 minutes three times a day.  Strive toward 150 minutes of cardiovascular activity per week, according to guidelines established by the [facility].  - Healthy dietary habits encouraged, including low-carb, and high amounts of lean protein in diet.   - Body mass index is 34.68 kg/m.  Explained to patient what BMI refers to, and what it means medically.   Told patient  to think about it as a "medical risk stratification measurement" and how increasing BMI is associated with increasing risk/ or worsening state of various diseases such as hypertension, hyperlipidemia, diabetes, premature OA, depression etc.  American Heart Association guidelines for healthy diet, basically Mediterranean diet, and exercise guidelines of 30 minutes 5 days per week or more discussed in detail.  Health counseling performed.  All questions answered.   Education and routine counseling performed. Handouts provided.   Orders Placed This Encounter  Procedures  . HM Diabetes Foot Exam    Meds ordered this encounter  Medications  . meloxicam (MOBIC) 15 MG tablet    Sig: Take 1 tablet (15 mg total) by mouth daily.    Dispense:  90 tablet    Refill:  1  . glipiZIDE (GLUCOTROL) 10 MG tablet    Sig: Take 1 tablet (10 mg total) by mouth 2 (  two) times daily.    Dispense:  180 tablet    Refill:  1  . lisinopril (PRINIVIL,ZESTRIL) 10 MG tablet    Sig: Take 1 tablet (10 mg total) by mouth daily.    Dispense:  90 tablet    Refill:  1  . PARoxetine (PAXIL) 20 MG tablet    Sig: Take 1 tablet (20 mg total) by mouth daily.    Dispense:  90 tablet    Refill:  1  . Omega-3 Fatty Acids (FISH OIL) 1200 MG CAPS    Sig: 2 cap twice daily  . metFORMIN (GLUCOPHAGE) 1000 MG tablet    Sig: Take 1 tablet (1,000 mg total) by mouth 2 (two) times daily with a meal.    Dispense:  180 tablet    Refill:  1     Return DM f/up in mid-April, for f/up for inc metformin- bring BS log.   Anticipatory guidance and routine counseling done re: condition, txmnt options and need for follow up. All questions of patient's were answered.   Gross side effects, risk and benefits, and alternatives of medications discussed with patient.  Patient is aware that all medications have potential side effects and we are unable to predict every sideeffect or drug-drug interaction that may occur.  Expresses verbal  understanding and consents to current therapy plan and treatment regiment.  Please see AVS handed out to patient at the end of our visit for additional patient instructions/ counseling done pertaining to today's office visit.  Note: This document was prepared using Dragon voice recognition software and may include unintentional dictation errors.    This document serves as a record of services personally performed by [name], DO. It was created on her behalf by Toni Amend, a trained medical scribe. The creation of this record is based on the scribe's personal observations and the provider's statements to them.   I have reviewed the above medical documentation for accuracy and completeness and I concur.  [name] 07/15/17 4:23 PM   ----------------------------------------------------------------------------------------------------------------------  Subjective:   CC:   Erin Marquez is a 47 y.o. female who presents to [place] [facility] today for review and discussion of recent bloodwork that was done.  1. All recent blood work that we ordered was reviewed with patient today.  Patient was counseled on all abnormalities and we discussed dietary and lifestyle changes that could help those values (also medications when appropriate).  Extensive health counseling performed and all patient's concerns/ questions were addressed.   Patient has made lifestyle changes since her last appointment on 06/13/2017.  Began taking her atorvastatin at night.  Has been walking her dog 10 minutes three times a day, and notes that it really feels good.  Noticed that her jeans are fitting better, even if she hasn't lost "poundage" yet.  Believes she was formerly in a slump that caused her to lose motivation to take care of herself.  Notes that she's been reading the labels, making better snack choices, and drinking much less soda. She's had a lot of trouble going "cold  [place]" on her sodas.  Diabetes She has not brought in her glucose log today. She has not been checking her sugars at home.  Health Maintenance Recently had her annual OBGYN exam with Dr. Standley Brooking. Patient made appointments with RA April 4th and with Dermatology May 1st.  Chronic Pain Notes that her feet and her hands bother her most of all with her pain. Was taking  tylenol nightly to control her pain.   Wt Readings from Last 3 Encounters:  07/05/17 210 lb (95.3 kg)  06/13/17 209 lb 14.4 oz (95.2 kg)   BP Readings from Last 3 Encounters:  07/05/17 122/84  06/13/17 132/88   Pulse Readings from Last 3 Encounters:  07/05/17 100  06/13/17 97   BMI Readings from Last 3 Encounters:  07/05/17 34.68 kg/m  06/13/17 34.66 kg/m     Patient Care Team    Relationship Specialty Notifications Start End  [name], DO PCP - General Family Medicine  06/12/17     Full medical history updated and reviewed in the office today  Patient Active Problem List   Diagnosis Date Noted  . Type 2 diabetes mellitus with complication, without long-term current use of insulin ([place]) 06/13/2017    Priority: High  . Hypertension associated with diabetes ([place]) 06/13/2017    Priority: High  . Mixed diabetic hyperlipidemia associated with type 2 diabetes mellitus ([place]) 06/13/2017    Priority: High  . Depression, recurrent ([place]) 06/16/2014    Priority: High  . PCOS (polycystic ovarian syndrome) 06/13/2017    Priority: Medium  . Vitamin D deficiency 06/13/2017    Priority: Medium  . Chronic Arthralgias- bilateral hands and feet 06/13/2017    Priority: Medium  . Family history of rheumatoid arthritis 06/13/2017    Priority: Medium  . Environmental and seasonal allergies 06/13/2017    Priority: Low  . SK (solar keratosis)- two on face 06/13/2017    Priority: Low  . Elevated liver enzymes- ALT and AST since  06/13/2017  . Sciatica 05/13/2014    Past Medical History:  Diagnosis Date  .  Diabetes mellitus without complication ([place])   . Hyperlipidemia   . Hypertension     Past Surgical History:  Procedure Laterality Date  . WISDOM TOOTH EXTRACTION      Social History   Tobacco Use  . Smoking status: Never Smoker  . Smokeless tobacco: Never Used  Substance Use Topics  . Alcohol use: Yes    Alcohol/week: 0.6 oz    Types: 1 Standard drinks or equivalent per week    Family Hx: Family History  Problem Relation Age of Onset  . Hypertension Mother   . Stroke Mother   . Diabetes Father      Medications: Current Outpatient Medications  Medication Sig Dispense Refill  . acetaminophen (TYLENOL) 650 MG CR tablet Take 1 tablet by mouth 2 (two) times daily.    Marland Kitchen atorvastatin (LIPITOR) 20 MG tablet Take 1 tablet by mouth at bedtime.  3  . Cholecalciferol (VITAMIN D3) 5000 units CAPS Take 1 capsule by mouth daily.    . fexofenadine (ALLEGRA) 180 MG tablet Take 180 mg by mouth daily.    Marland Kitchen glipiZIDE (GLUCOTROL) 10 MG tablet Take 1 tablet (10 mg total) by mouth 2 (two) times daily. 180 tablet 1  . lisinopril (PRINIVIL,ZESTRIL) 10 MG tablet Take 1 tablet (10 mg total) by mouth daily. 90 tablet 1  . meloxicam (MOBIC) 15 MG tablet Take 1 tablet (15 mg total) by mouth daily. 90 tablet 1  . Multiple Vitamins-Minerals (WOMENS MULTIVITAMIN PO) Take 1 tablet by mouth 2 (two) times daily.    . norethindrone (AYGESTIN) 5 MG tablet Take 5 mg by mouth daily. For 10 days to start cycle    . Omega-3 Fatty Acids (FISH OIL) 1200 MG CAPS 2 cap twice daily    . PARoxetine (PAXIL) 20 MG tablet Take 1 tablet (20 mg  total) by mouth daily. 90 tablet 1  . vitamin B-12 (CYANOCOBALAMIN) 100 MCG tablet Take 1 capsule by mouth daily.    . metFORMIN (GLUCOPHAGE) 1000 MG tablet Take 1 tablet (1,000 mg total) by mouth 2 (two) times daily with a meal. 180 tablet 1   No current facility-administered medications for this visit.     Allergies:  Allergies  Allergen Reactions  . Codeine Nausea Only  .  Peanut Oil     Slurred speech     Review of Systems: General:   No F/C, wt loss Pulm:   No DIB, SOB, pleuritic chest pain Card:  No CP, palpitations Abd:  No n/v/d or pain Ext:  No inc edema from baseline  Objective:  Blood pressure 122/84, pulse 100, height 5' 5.25" (1.657 m), weight 210 lb (95.3 kg), last menstrual period 06/01/2017, SpO2 99 %. Body mass index is 34.68 kg/m. Gen:   Well NAD, A and O *3 HEENT:    [place]/AT, EOMI,  MMM Lungs:   Normal work of breathing. CTA B/L, no Wh, rhonchi Heart:   RRR, S1, S2 WNL's, no MRG Abd:   No gross distention Exts:    warm, pink,  Brisk capillary refill, warm and well perfused.  Psych:    No HI/SI, judgement and insight good, Euthymic mood. Full Affect.   Recent Results (from the past 2160 hour(s))  Comprehensive metabolic panel     Status: Abnormal   Collection Time: 06/13/17  5:35 PM  Result Value Ref Range   Glucose 166 (H) 65 - 99 mg/dL   BUN 14 6 - 24 mg/dL   Creatinine, Ser 0.94 0.57 - 1.00 mg/dL   GFR calc non Af Amer 74 >59 mL/min/1.73   GFR calc Af Amer 85 >59 mL/min/1.73   BUN/Creatinine Ratio 15 9 - 23   Sodium 136 134 - 144 mmol/L   Potassium 4.7 3.5 - 5.2 mmol/L   Chloride 97 96 - 106 mmol/L   CO2 19 (L) 20 - 29 mmol/L   Calcium 9.5 8.7 - 10.2 mg/dL   Total Protein 7.6 6.0 - 8.5 g/dL   Albumin 4.6 3.5 - 5.5 g/dL   Globulin, Total 3.0 1.5 - 4.5 g/dL   Albumin/Globulin Ratio 1.5 1.2 - 2.2   Bilirubin Total 0.8 0.0 - 1.2 mg/dL   Alkaline Phosphatase 43 39 - 117 IU/L   AST 54 (H) 0 - 40 IU/L   ALT 51 (H) 0 - 32 IU/L  Hemoglobin A1c     Status: Abnormal   Collection Time: 06/13/17  5:35 PM  Result Value Ref Range   Hgb A1c MFr Bld 8.7 (H) 4.8 - 5.6 %    Comment:          Prediabetes: 5.7 - 6.4          Diabetes: >6.4          Glycemic control for adults with diabetes: <7.0    Est. average glucose Bld gHb Est-mCnc 203 mg/dL  Lipid panel     Status: Abnormal   Collection Time: 06/13/17  5:35 PM  Result Value  Ref Range   Cholesterol, Total 151 100 - 199 mg/dL   Triglycerides 214 (H) 0 - 149 mg/dL   HDL 40 >39 mg/dL   VLDL Cholesterol Cal 43 (H) 5 - 40 mg/dL   LDL Calculated 68 0 - 99 mg/dL   Chol/HDL Ratio 3.8 0.0 - 4.4 ratio    Comment:  T. Chol/HDL Ratio                                             Men  Women                               1/2 Avg.Risk  3.4    3.3                                   Avg.Risk  5.0    4.4                                2X Avg.Risk  9.6    7.1                                3X Avg.Risk 23.4   11.0   TSH     Status: None   Collection Time: 06/13/17  5:35 PM  Result Value Ref Range   TSH 2.020 0.450 - 4.500 uIU/mL  VITAMIN D 25 Hydroxy (Vit-D Deficiency, Fractures)     Status: None   Collection Time: 06/13/17  5:35 PM  Result Value Ref Range   Vit D, 25-Hydroxy 65.4 30.0 - 100.0 ng/mL    Comment: Vitamin D deficiency has been defined by the [place] practice guideline as a level of serum 25-OH vitamin D less than 20 ng/mL (1,2). The Endocrine Society went on to further define vitamin D insufficiency as a level between 21 and 29 ng/mL (2). 1. IOM (Institute of Medicine). 2010. Dietary reference    intakes for calcium and D. [place]: The    [employer]. 2. Holick MF, Binkley [place], Bischoff-Ferrari HA, et al.    Evaluation, treatment, and prevention of vitamin D    deficiency: an Endocrine Society clinical practice    guideline. JCEM. 2011 Jul; 96(7):1911-30.   CBC with Differential/Platelet     Status: Abnormal   Collection Time: 06/13/17  5:35 PM  Result Value Ref Range   WBC 9.0 3.4 - 10.8 x10E3/uL   RBC 4.15 3.77 - 5.28 x10E6/uL   Hemoglobin 13.1 11.1 - 15.9 g/dL   Hematocrit 38.5 34.0 - 46.6 %   MCV 93 79 - 97 fL   MCH 31.6 26.6 - 33.0 pg   MCHC 34.0 31.5 - 35.7 g/dL   RDW 13.2 12.3 - 15.4 %   Platelets 245 150 - 379 x10E3/uL   Neutrophils 46 Not Estab. %    Lymphs 48 Not Estab. %   Monocytes 4 Not Estab. %   Eos 2 Not Estab. %   Basos 0 Not Estab. %   Neutrophils Absolute 4.1 1.4 - 7.0 x10E3/uL   Lymphocytes Absolute 4.2 (H) 0.7 - 3.1 x10E3/uL   Monocytes Absolute 0.4 0.1 - 0.9 x10E3/uL   EOS (ABSOLUTE) 0.2 0.0 - 0.4 x10E3/uL   Basophils Absolute 0.0 0.0 - 0.2 x10E3/uL   Immature Granulocytes 0 Not Estab. %   Immature Grans (Abs) 0.0 0.0 - 0.1 x10E3/uL  POCT UA - Microalbumin     Status: Normal   Collection Time: 06/14/17  5:15 PM  Result Value Ref Range   Microalbumin Ur, POC 10 mg/L   Creatinine, POC 300 mg/dL   Albumin/Creatinine Ratio, Urine, POC <30

## 2017-07-05 NOTE — Patient Instructions (Signed)
Check your fasting blood sugars especially after you eat good and bad the night before.  As well 2-hour postprandial which is 2 hours after your largest meal I want you to check as well.  Again only after you eat especially healthy or specially poorly.  This will give you a very good positive and negative feedback to help encourage you to a healthier way of living -Goal fasting blood sugars should be under 130 and the 2 hours after largest meal should be goal under 180.  Www.heart.org    Guidelines for a Low Cholesterol, Low Saturated Fat Diet   Fats - Limit total intake of fats and oils. - Avoid butter, stick margarine, shortening, lard, palm and coconut oils. - Limit mayonnaise, salad dressings, gravies and sauces, unless they are homemade with low-fat ingredients. - Limit chocolate. - Choose low-fat and nonfat products, such as low-fat mayonnaise, low-fat or non-hydrogenated peanut butter, low-fat or fat-free salad dressings and nonfat gravy. - Use vegetable oil, such as canola or olive oil. - Look for margarine that does not contain trans fatty acids. - Use nuts in moderate amounts. - Read ingredient labels carefully to determine both amount and type of fat present in foods. Limit saturated and trans fats! - Avoid high-fat processed and convenience foods.  Meats and Meat Alternatives - Choose fish, chicken, [place] and lean meats. - Use dried beans, peas, lentils and tofu. - Limit egg yolks to three to four per week. - If you eat red meat, limit to no more than three servings per week and choose loin or round cuts. - Avoid fatty meats, such as bacon, sausage, franks, luncheon meats and ribs. - Avoid all organ meats, including liver.  Dairy - Choose nonfat or low-fat milk, yogurt and cottage cheese. - Most cheeses are high in fat. Choose cheeses made from non-fat milk, such as mozzarella and ricotta cheese. - Choose light or fat-free cream cheese and sour cream. - Avoid cream  and sauces made with cream.  Fruits and Vegetables - Eat a wide variety of fruits and vegetables. - Use lemon juice, vinegar or "mist" olive oil on vegetables. - Avoid adding sauces, fat or oil to vegetables.  Breads, Cereals and Grains - Choose whole-grain breads, cereals, pastas and rice. - Avoid high-fat snack foods, such as granola, cookies, pies, pastries, doughnuts and croissants.  Cooking Tips - Avoid deep fried foods. - Trim visible fat off meats and remove skin from poultry before cooking. - Bake, broil, boil, poach or roast poultry, fish and lean meats. - Drain and discard fat that drains out of meat as you cook it. - Add little or no fat to foods. - Use vegetable oil sprays to grease pans for cooking or baking. - Steam vegetables. - Use herbs or no-oil marinades to flavor foods.     The quick and dirty--> lower triglyceride levels more through...  1) - Beware of bad fats: Cutting back on saturated fat (in red meat and full-fat dairy foods) and trans fats (in restaurant fried foods and commercially prepared baked goods) can lower triglycerides.  2) - Go for good carbs: Easily digested carbohydrates (such as white bread, white rice, cornflakes, and sugary sodas) give triglycerides a definite boost.   3) - Eating whole grains and cutting back on soda can help control triglycerides.  4) - Check your alcohol use. In some people, alcohol dramatically boosts triglycerides. The only way to know if this is true for you is to avoid alcohol for a  few weeks and have your triglycerides tested again.  5) - Go fish. Omega-3 fats in salmon, tuna, sardines, and other fatty fish can lower triglycerides. Having fish twice a week is fine.  6) - Aim for a healthy weight. If you are overweight, losing just 5% to 10% of your weight can help drive down triglycerides.  7) - Get moving. Exercise lowers triglycerides and boosts heart-healthy HDL cholesterol.  8) - quit smoking if you  do  --> for more information, see below; or go to  www.heart.org  and do a search for desired topics   For those diagnosed with high triglycerides, it's important to take action to lower your levels and improve your heart health.  Triglyceride is just a fancy word for fat - the fat in our bodies is stored in the form of triglycerides. Triglycerides are found in foods and manufactured in our bodies.  Normal triglyceride levels are defined as less than 150 mg/dL; 150 to 199 is considered borderline high; 200 to 499 is high; and 500 or higher is officially called very high. To me, anything over 150 is a red flag indicating my patient needs to take immediate steps to get the situation under control.   What is the significance of high triglycerides? High triglyceride levels make blood thicker and stickier, which means that it is more likely to form clots. Studies have shown that triglyceride levels are associated with increased risks of cardiovascular disease and stroke - in both men and women - alone or in combination with other risk factors (high triglycerides combined with high LDL cholesterol can be a particularly deadly combination). For example, in one ground-breaking study, high triglycerides alone increased the risk of cardiovascular disease by 14 percent in men, and by 66 percent in women. But when the test subjects also had low HDL cholesterol (that's the good cholesterol) and other risk factors, high triglycerides increased the risk of disease by 32 percent in men and 76 percent in women.   Fortunately, triglycerides can sometimes be controlled with several diet and lifestyle changes.    What Factors Can Increase Triglycerides? As with cholesterol, eating too much of the wrong kinds of fats will raise your blood triglycerides.  Therefore, it's important to restrict the amounts of saturated fats and trans fats you allow into your diet.  Triglyceride levels can also shoot up after eating foods  that are high in carbohydrates or after drinking alcohol.  That's why triglyceride blood tests require an overnight fast.  If you have elevated triglycerides, it's especially important to avoid sugary and refined carbohydrates, including sugar, honey, and other sweeteners, soda and other sugary drinks, candy, baked goods, and anything made with white (refined or enriched) flour, including white bread, rolls, cereals, buns, pastries, regular pasta, and white rice.  You'll also want to limit dried fruit and fruit juice since they're dense in simple sugar.  All of these low-quality carbs cause a sudden rise in insulin, which may lead to a spike in triglycerides.  Triglycerides can also become elevated as a reaction to having diabetes, hypothyroidism, or kidney disease. As with most other heart-related factors, being overweight and inactive also contribute to abnormal triglycerides. And unfortunately, some people have a genetic predisposition that causes them to manufacture way too much triglycerides on their own, no matter how carefully they eat.     How Can You Lower Your Triglyceride Levels? If you are diagnosed with high triglycerides, it's important to take action. There are several things  you can do to help lower your triglyceride levels and improve your heart health:  --> Lose weight if you are overweight.  There is a clear correlation between obesity and high triglycerides - the heavier people are, the higher their triglyceride levels are likely to be. The good news is that losing weight can significantly lower triglycerides. In a large study of individuals with type 2 diabetes, those assigned to the "lifestyle intervention group" - which involved counseling, a low-calorie meal plan, and customized exercise program - lost 8.6% of their body weight and lowered their triglyceride levels by more than 16%. If you're overweight, find a weight loss plan that works for you and commit to shedding the pounds  and getting healthier.  --> Reduce the amount of saturated fat and trans fat in your diet.  Start by avoiding or dramatically limiting butter, cream cheese, lard, sour cream, doughnuts, cakes, cookies, candy bars, regular ice cream, fried foods, pizza, cheese sauce, cream-based sauces and salad dressings, high-fat meats (including fatty hamburgers, bologna, pepperoni, sausage, bacon, salami, pastrami, spareribs, and hot dogs), high-fat cuts of beef and pork, and whole-milk dairy products.   Other ways to cut back: Choose lean meats only (including skinless chicken and [place], lean beef, lean pork), fish, and reduced-fat or fat-free dairy products.   Experiment with adding whole soy foods to your diet. Although soy itself may not reduce risk of heart disease, it replaces hazardous animal fats with healthier proteins. Choose high-quality soy foods, such as tofu, tempeh, soy milk, and edamame (whole soybeans).  Always remove skin from poultry.  Prepare foods by baking, roasting, broiling, boiling, poaching, steaming, grilling, or stir-frying in vegetable oil.  Most stick margarines contain trans fats, and trans fats are also found in some packaged baked goods, potato chips, snack foods, fried foods, and fast food that use or create hydrogenated oils.    (All food labels must now list the amount of trans fats, right after the amount of saturated fats - good news for consumers. As a result, many food companies have now reformulated their products to be trans fat free.many, but not all! So it's still just as important to read labels and make sure the packaged foods you buy don't contain trans fats.)     If you use margarine, purchase soft-tub margarine spreads that contain 0 grams trans fats and don't list any partially hydrogenated oils in the ingredients list. By substituting olive oil or vegetable oil for trans fats in just 2 percent of your daily calories, you can reduce your risk of heart disease by 53  percent.   There is no safe amount of trans fats, so try to keep them as far from your plate as possible.  -->  Avoid foods that are concentrated in sugar (even dried fruit and fruit juice). Sugary foods can elevate triglyceride levels in the blood, so keep them to a bare minimum.  --> Swap out refined carbohydrates for whole grains.  Refined carbohydrates - like white rice, regular pasta, and anything made with white or "enriched" flour (including white bread, rolls, cereals, buns, and crackers) - raise blood sugar and insulin levels more than fiber-rich whole grains. Higher insulin levels, in turn, can lead to a higher rise in triglycerides after a meal. So, make the switch to whole wheat bread, whole grain pasta, brown or wild rice, and whole grain versions of cereals, crackers, and other bread products. However, it's important to know that individuals with high triglycerides should moderate even  their intake of high-quality starches (since all starches raise blood sugar) - I recommend 1 to 2 servings per meal.  --> Cut way back on alcohol.  If you have high triglycerides, alcohol should be considered a rare treat - if you indulge at all, since even small amounts of alcohol can dramatically increase triglyceride levels.  --> Incorporate omega-3 fats.  Heart-healthy fish oils are especially rich in omega-3 fatty acids. In multiple studies over the past two decades, people who ate diets high in omega-3s had 30 to 40 percent reductions in heart disease. Although we don't yet know why fish oil works so well, there are several possibilities. Omega-3s seem to reduce inflammation, reduce high blood pressure, decrease triglycerides, raise HDL cholesterol, and make blood thinner and less sticky so it is less likely to clot. It's as close to a food prescription for heart health as it gets. If you have high triglycerides, I recommend eating at least three servings of one of the omega-3-rich fish every week  (fatty fish is the most concentrated food form of omega three fats). If you cannot manage to eat that much fish, speak with your physician about taking fish oil capsules, which offer similar benefits.The best foods for omega-3 fatty acids include wild salmon (fresh, canned), herring, mackerel (not king), sardines, anchovies, rainbow trout, and Pacific oysters. Non-fish sources of omega-3 fats include omega-3-fortified eggs, ground flaxseed, chia seeds, walnuts, butternuts (white walnuts), seaweed, walnut oil, canola oil, and soybeans.  --> Quit smoking.  Smoking causes inflammation, not just in your lungs, but throughout your body. Inflammation can contribute to atherosclerosis, blood clots, and risk of heart attack. Smoking makes all heart health indicators worse. If you have high cholesterol, high triglycerides, or high blood pressure, smoking magnifies the danger.  --> Become more physically active.  Even moderate exercise can help improve cholesterol, triglycerides, and blood pressure. Aerobic exercise seems to be able to stop the sharp rise of triglycerides after eating, perhaps because of a decrease in the amount of triglyceride released by the liver, or because active muscle clears triglycerides out of the blood stream more quickly than inactive muscle. If you haven't exercised regularly (or at all) for years, I recommend starting slowly, by walking at an easy pace for 15 minutes a day. Then, as you feel more comfortable, increase the amount. Your ultimate goal should be at least 30 minutes of moderate physical activity, at least five days a week.

## 2017-08-02 MED FILL — glipiZIDE 10 MG TABS: 10 | 90 days supply | Qty: 180 | Fill #0

## 2017-08-10 DIAGNOSIS — Z6834 Body mass index (BMI) 34.0-34.9, adult: Secondary | ICD-10-CM | POA: Diagnosis not present

## 2017-08-10 DIAGNOSIS — R5383 Other fatigue: Secondary | ICD-10-CM | POA: Diagnosis not present

## 2017-08-10 DIAGNOSIS — M2550 Pain in unspecified joint: Secondary | ICD-10-CM | POA: Diagnosis not present

## 2017-08-10 DIAGNOSIS — E669 Obesity, unspecified: Secondary | ICD-10-CM | POA: Diagnosis not present

## 2017-08-15 ENCOUNTER — Ambulatory Visit: Payer: 59 | Admitting: Family Medicine

## 2017-09-06 DIAGNOSIS — L821 Other seborrheic keratosis: Secondary | ICD-10-CM | POA: Diagnosis not present

## 2017-09-06 DIAGNOSIS — L918 Other hypertrophic disorders of the skin: Secondary | ICD-10-CM | POA: Diagnosis not present

## 2017-09-06 MED FILL — PARoxetine HCL 20 MG TABS: 20 | 90 days supply | Qty: 90 | Fill #0

## 2017-09-11 DIAGNOSIS — Z6835 Body mass index (BMI) 35.0-35.9, adult: Secondary | ICD-10-CM | POA: Diagnosis not present

## 2017-09-11 DIAGNOSIS — M2550 Pain in unspecified joint: Secondary | ICD-10-CM | POA: Diagnosis not present

## 2017-09-11 DIAGNOSIS — E669 Obesity, unspecified: Secondary | ICD-10-CM | POA: Diagnosis not present

## 2017-09-11 DIAGNOSIS — R202 Paresthesia of skin: Secondary | ICD-10-CM | POA: Diagnosis not present

## 2017-09-11 DIAGNOSIS — R5383 Other fatigue: Secondary | ICD-10-CM | POA: Diagnosis not present

## 2017-09-29 MED FILL — metFORMIN HCL 1000 MG TABS: 1000 | 90 days supply | Qty: 180 | Fill #1

## 2017-11-06 MED FILL — LISINOPRIL 10 MG TABLET: 10 | 90 days supply | Qty: 90 | Fill #1

## 2017-11-14 MED FILL — glipiZIDE 10 MG TABS: 10 | 90 days supply | Qty: 180 | Fill #1

## 2017-12-08 MED FILL — PARoxetine HCL 20 MG TABS: 20 | 90 days supply | Qty: 90 | Fill #1

## 2018-02-05 ENCOUNTER — Other Ambulatory Visit: Payer: Self-pay | Admitting: Family Medicine

## 2018-02-05 DIAGNOSIS — E1159 Type 2 diabetes mellitus with other circulatory complications: Secondary | ICD-10-CM

## 2018-02-05 DIAGNOSIS — I152 Hypertension secondary to endocrine disorders: Secondary | ICD-10-CM

## 2018-02-05 DIAGNOSIS — I10 Essential (primary) hypertension: Principal | ICD-10-CM

## 2018-02-05 MED FILL — LISINOPRIL 10 MG TABLET: 10 | 30 days supply | Qty: 30 | Fill #0

## 2018-02-12 ENCOUNTER — Telehealth: Payer: Self-pay | Admitting: Family Medicine

## 2018-02-12 ENCOUNTER — Other Ambulatory Visit: Payer: Self-pay | Admitting: Family Medicine

## 2018-02-12 DIAGNOSIS — E118 Type 2 diabetes mellitus with unspecified complications: Secondary | ICD-10-CM

## 2018-02-12 MED FILL — glipiZIDE 10 MG TABS: 10 | 30 days supply | Qty: 60 | Fill #0

## 2018-02-12 MED FILL — ATORVASTATIN CALCIUM 20 MG: 20 | 30 days supply | Qty: 30 | Fill #0

## 2018-02-12 MED FILL — metFORMIN HCL 1000 MG TABS: 1000 | 30 days supply | Qty: 60 | Fill #0

## 2018-02-12 NOTE — Telephone Encounter (Signed)
Left patient message to contact office for  Provider required OV  --tk

## 2018-02-12 NOTE — Telephone Encounter (Signed)
-----   Message from Jerilee Field, [place] sent at 02/12/2018 10:25 AM EDT ----- Patient is due for 3 month follow up, please call the patient to make an appointment.  Thanks. MPulliam, CMA/RT(R)

## 2018-03-18 ENCOUNTER — Encounter (HOSPITAL_COMMUNITY): Payer: Self-pay | Admitting: Emergency Medicine

## 2018-03-18 ENCOUNTER — Ambulatory Visit (HOSPITAL_COMMUNITY)
Admission: EM | Admit: 2018-03-18 | Discharge: 2018-03-18 | Disposition: A | Discharge status: Home / Self Care | Payer: 59 | Attending: Family Medicine | Admitting: Family Medicine

## 2018-03-18 DIAGNOSIS — H66001 Acute suppurative otitis media without spontaneous rupture of ear drum, right ear: Secondary | ICD-10-CM | POA: Diagnosis not present

## 2018-03-18 MED ORDER — AMOXICILLIN-POT CLAVULANATE 875-125 MG PO TABS: 1.0000 | ORAL_TABLET | Freq: Two times a day (BID) | ORAL | 0 refills | Status: DC

## 2018-03-18 MED ORDER — ONDANSETRON 4 MG PO TBDP
4.0000 mg | ORAL_TABLET | Freq: Once | ORAL | Status: AC
  Administered 2018-03-18: 4 mg via ORAL

## 2018-03-18 MED ORDER — ACETAMINOPHEN 325 MG PO TABS
975.0000 mg | ORAL_TABLET | Freq: Once | ORAL | Status: AC
  Administered 2018-03-18: 975 mg via ORAL

## 2018-03-18 MED ORDER — ACETAMINOPHEN 325 MG PO TABS
ORAL_TABLET | ORAL | Status: AC
  Filled 2018-03-18: qty 3

## 2018-03-18 MED ORDER — ONDANSETRON HCL 4 MG PO TABS: 4.0000 mg | ORAL_TABLET | Freq: Four times a day (QID) | ORAL | 0 refills | Status: DC

## 2018-03-18 MED ORDER — ONDANSETRON 4 MG PO TBDP
ORAL_TABLET | ORAL | Status: AC
  Filled 2018-03-18: qty 1

## 2018-03-18 NOTE — Discharge Instructions (Addendum)
Take Augmentin 1 pill twice a day for 10 days Drink plenty of fluids Take Zofran ([place])as needed for nausea and vomiting Try ibuprofen 800 mg three times a day with food for aches and pain Follow with  your pCP

## 2018-03-18 NOTE — ED Provider Notes (Signed)
[facility]    CSN: [id number] Arrival date & time: 03/18/18  1713     History   Chief Complaint Chief Complaint  Patient presents with  . Fever  . Otalgia    HPI Erin Marquez is a 47 y.o. female.   HPI  Patient is here with fever and symptoms that started late last night, and today.  She has body aches.  Fever.  Temperatures 101.6.  Headache.  Right ear pain that is quite severe.  Mild sore throat.  No cough.  No nausea vomiting diarrhea.  Very tired. She states her daughter is just recovering from a sinus infection. No one else at home is sick. She has not had a flu shot this season. She wonders if this is influenza.  Past Medical History:  Diagnosis Date  . Diabetes mellitus without complication ([place])   . Hyperlipidemia   . Hypertension     Patient Active Problem List   Diagnosis Date Noted  . PCOS (polycystic ovarian syndrome) 06/13/2017  . Type 2 diabetes mellitus with complication, without long-term current use of insulin ([place]) 06/13/2017  . Hypertension associated with diabetes ([place]) 06/13/2017  . Mixed diabetic hyperlipidemia associated with type 2 diabetes mellitus ([place]) 06/13/2017  . Vitamin D deficiency 06/13/2017  . Environmental and seasonal allergies 06/13/2017  . Chronic Arthralgias- bilateral hands and feet 06/13/2017  . Family history of rheumatoid arthritis 06/13/2017  . 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 Elevated liver enzymes- ALT and AST since  06/13/2017  . Depression, recurrent ([place]) 06/16/2014  . Sciatica 05/13/2014    Past Surgical History:  Procedure Laterality Date  . WISDOM TOOTH EXTRACTION      OB History   None      Home Medications    Prior to Admission medications   Medication Sig Start Date End Date Taking? Authorizing Provider  acetaminophen (TYLENOL) 650 MG CR tablet Take 1 tablet by mouth 2 (two) times daily.    [provider]  amoxicillin-clavulanate (AUGMENTIN) 875-125 MG tablet  Take 1 tablet by mouth every 12 (twelve) hours. 03/18/18   Raylene Everts, MD  atorvastatin (LIPITOR) 20 MG tablet Take 1 tablet by mouth at bedtime. 03/22/17   [provider]  Cholecalciferol (VITAMIN D3) 5000 units CAPS Take 1 capsule by mouth daily.    [provider]  fexofenadine (ALLEGRA) 180 MG tablet Take 180 mg by mouth daily.    [provider]  glipiZIDE (GLUCOTROL) 10 MG tablet Take 1 tablet (10 mg total) by mouth 2 (two) times daily. Patient needs office visit for further refills 02/12/18   Opalski, Deborah, DO  lisinopril (PRINIVIL,ZESTRIL) 10 MG tablet Take 1 tablet (10 mg total) by mouth daily. Patient needs office visit for further refills. 02/05/18   Opalski, Neoma Laming, DO  meloxicam (MOBIC) 15 MG tablet Take 1 tablet (15 mg total) by mouth daily. 07/05/17   [name], DO  metFORMIN (GLUCOPHAGE) 1000 MG tablet Take 1 tablet (1,000 mg total) by mouth 2 (two) times daily with a meal. Patient needs office visit for further refills 02/12/18   Opalski, Deborah, DO  Multiple Vitamins-Minerals (WOMENS MULTIVITAMIN PO) Take 1 tablet by mouth 2 (two) times daily.    [provider]  norethindrone (AYGESTIN) 5 MG tablet Take 5 mg by mouth daily. For 10 days to start cycle    [provider]  Omega-3 Fatty Acids (FISH OIL) 1200 MG CAPS 2 cap twice daily 07/05/17   Opalski,  Deborah, DO  ondansetron (ZOFRAN) 4 MG tablet Take 1 tablet (4 mg total) by mouth every 6 (six) hours. 03/18/18   Raylene Everts, MD  PARoxetine (PAXIL) 20 MG tablet Take 1 tablet (20 mg total) by mouth daily. 07/05/17   Opalski, Neoma Laming, DO  vitamin B-12 (CYANOCOBALAMIN) 100 MCG tablet Take 1 capsule by mouth daily.    [provider]    Family History Family History  Problem Relation Age of Onset  . Hypertension Mother   . Stroke Mother   . Diabetes Father     Social History Social History   Tobacco Use  . Smoking status: Never Smoker  . Smokeless  tobacco: Never Used  Substance Use Topics  . Alcohol use: Yes    Alcohol/week: 1.0 standard drinks    Types: 1 Standard drinks or equivalent per week  . Drug use: No     Allergies   Codeine and Peanut oil   Review of Systems Review of Systems  Constitutional: Positive for appetite change, fatigue and fever. Negative for chills.  HENT: Positive for congestion and ear pain. Negative for sinus pressure, sinus pain and sore throat.   Eyes: Negative for pain and visual disturbance.  Respiratory: Negative for cough and shortness of breath.   Cardiovascular: Negative for chest pain and palpitations.  Gastrointestinal: Negative for abdominal pain and vomiting.  Genitourinary: Negative for dysuria and hematuria.  Musculoskeletal: Negative for arthralgias and back pain.  Skin: Negative for color change and rash.  Neurological: Negative for seizures and syncope.  All other systems reviewed and are negative.    Physical Exam Triage Vital Signs ED Triage Vitals [03/18/18 1734]  Enc Vitals Group     BP (!) 143/97     Pulse Rate (!) 135     Resp 18     Temp (!) 101.6 F (38.7 C)     Temp Source Oral     SpO2 97 %     Weight      Height      Head Circumference      Peak Flow      Pain Score 8     Pain Loc      Pain Edu?      Excl. in [place]?    No data found.  Updated Vital Signs BP (!) 143/97 (BP Location: Left Arm)   Pulse (!) 135   Temp (!) 101.6 F (38.7 C) (Oral)   Resp 18   SpO2 97%      Physical Exam  Constitutional: She appears well-developed and well-nourished. No distress.  Appears ill, tired.  Skin flushed and damp  HENT:  Head: Normocephalic and atraumatic.  Left Ear: External ear normal.  Mouth/Throat: Oropharynx is clear and moist.  Left ear clear.  Right ear occluded with cerumen.  After irrigation, patient became acutely vertiginous and vomited.  The TM on the right is dull and red.  Eyes: Pupils are equal, round, and reactive to light. Conjunctivae are  normal.  Neck: Normal range of motion.  Cardiovascular: Regular rhythm and normal heart sounds.  Tachycardia  Pulmonary/Chest: Effort normal and breath sounds normal. No respiratory distress.  Abdominal: Soft. She exhibits no distension.  Musculoskeletal: Normal range of motion. She exhibits no edema.  Lymphadenopathy:    She has cervical adenopathy.  Neurological: She is alert.  Skin: Skin is warm. She is diaphoretic.  Psychiatric: She has a normal mood and affect. Her behavior is normal.     UC Treatments /  Results  Labs (all labs ordered are listed, but only abnormal results are displayed) Labs Reviewed - No data to display  EKG None  Radiology No results found.  Procedures Procedures (including critical care time)  Medications Ordered in UC Medications  acetaminophen (TYLENOL) tablet 975 mg (975 mg Oral Given 03/18/18 1750)  ondansetron (ZOFRAN-ODT) disintegrating tablet 4 mg (4 mg Oral Given 03/18/18 1821)    Initial Impression / Assessment and Plan / UC Course  I have reviewed the triage vital signs and the nursing notes.  Pertinent labs & imaging results that were available during my care of the patient were reviewed by me and considered in my medical decision making (see chart for details).     Discussed with patient this appears to be an acute bacterial otitis media.  That will cause fever, chills, body aches, and I believe the irrigation may have triggered the vertigo.  We did treat her with antibiotics and symptomatic care.  Follow-up with her PCP if not better in a week. Final Clinical Impressions(s) / UC Diagnoses   Final diagnoses:  Non-recurrent acute suppurative otitis media of right ear without spontaneous rupture of tympanic membrane     Discharge Instructions     Take Augmentin 1 pill twice a day for 10 days Drink plenty of fluids Take Zofran ([place])as needed for nausea and vomiting Try ibuprofen 800 mg three times a day with food for  aches and pain Follow with  your pCP   ED Prescriptions    Medication Sig Dispense Auth. Provider   amoxicillin-clavulanate (AUGMENTIN) 875-125 MG tablet Take 1 tablet by mouth every 12 (twelve) hours. 20 tablet Raylene Everts, MD   ondansetron (ZOFRAN) 4 MG tablet Take 1 tablet (4 mg total) by mouth every 6 (six) hours. 12 tablet Raylene Everts, MD     Controlled Substance Prescriptions [place] Controlled Substance Registry consulted? Not Applicable   Raylene Everts, MD 03/18/18 [phone]

## 2018-03-18 NOTE — ED Notes (Signed)
Pt awaiting spouse's arrival for ride home.  Discharge papers reviewed.

## 2018-03-18 NOTE — ED Triage Notes (Addendum)
Pt sts fever and right ear pain starting last night; pt took tylenol at 1500

## 2018-03-22 ENCOUNTER — Encounter: Payer: Self-pay | Admitting: Family Medicine

## 2018-03-22 ENCOUNTER — Ambulatory Visit: Payer: Self-pay | Admitting: Family Medicine

## 2018-03-22 VITALS — BP 112/84 | HR 113 | Temp 98.8°F | Wt 209.8 lb

## 2018-03-22 DIAGNOSIS — R062 Wheezing: Secondary | ICD-10-CM

## 2018-03-22 DIAGNOSIS — R059 Cough, unspecified: Secondary | ICD-10-CM

## 2018-03-22 DIAGNOSIS — R05 Cough: Secondary | ICD-10-CM

## 2018-03-22 MED ORDER — PREDNISONE 20 MG PO TABS: 40.0000 mg | ORAL_TABLET | Freq: Every day | ORAL | 0 refills | Status: AC

## 2018-03-22 MED ORDER — BENZONATATE 200 MG PO CAPS: 200.0000 mg | ORAL_CAPSULE | Freq: Three times a day (TID) | ORAL | 0 refills | Status: DC | PRN

## 2018-03-22 MED ORDER — PSEUDOEPH-BROMPHEN-DM 30-2-10 MG/5ML PO SYRP: 10.0000 mL | ORAL_SOLUTION | Freq: Three times a day (TID) | ORAL | 0 refills | Status: DC | PRN

## 2018-03-22 MED ORDER — ALBUTEROL SULFATE HFA 108 (90 BASE) MCG/ACT IN AERS: 2.0000 | INHALATION_SPRAY | Freq: Four times a day (QID) | RESPIRATORY_TRACT | 0 refills | Status: DC | PRN

## 2018-03-22 MED FILL — BROMPHENIR-PSEUDOEPHED-DM S: 30-2-10 | 4 days supply | Qty: 120 | Fill #0

## 2018-03-22 MED FILL — predniSONE 20 MG TABS: 20 | 5 days supply | Qty: 10 | Fill #0

## 2018-03-22 MED FILL — VENTOLIN HFA 90 MCG INHALER: 108 (90 BAS | 16 days supply | Qty: 18 | Fill #0

## 2018-03-22 MED FILL — BENZONATATE 200 MG CAPS: 200 | 6 days supply | Qty: 20 | Fill #0

## 2018-03-22 NOTE — Progress Notes (Signed)
Erin Marquez is a 47 y.o. female who presents today with concerns of cough and congestion for the last 3 days. She was previously seen and treated for fever and body aches on 03/18/2018 and dx with right ear infection and treated with Augmentin. Since that time fever has resolved, vital signs have improved on comparison and symptoms overall have decreased patient is concerned with persistent sweating and chills and cough and congestion.  Review of Systems  Constitutional: Positive for chills and fever. Negative for malaise/fatigue.  HENT: Positive for congestion. Negative for ear discharge, ear pain, sinus pain and sore throat.   Eyes: Negative.   Respiratory: Positive for cough. Negative for sputum production and shortness of breath.   Cardiovascular: Negative.  Negative for chest pain.  Gastrointestinal: Negative for abdominal pain, diarrhea, nausea and vomiting.  Genitourinary: Negative for dysuria, frequency, hematuria and urgency.  Musculoskeletal: Negative for myalgias.  Skin: Negative.   Neurological: Negative for headaches.  Endo/Heme/Allergies: Negative.   Psychiatric/Behavioral: Negative.     O: Vitals:   03/22/18 1056  BP: 112/84  Pulse: (!) 113  Temp: 98.8 F (37.1 C)  SpO2: 98%     Physical Exam  Constitutional: She is oriented to person, place, and time. Vital signs are normal. She appears well-developed and well-nourished. She is active.  Non-toxic appearance. She does not have a sickly appearance.  HENT:  Head: Normocephalic.  Right Ear: Hearing, tympanic membrane, external ear and ear canal normal.  Left Ear: Hearing, tympanic membrane, external ear and ear canal normal.  Nose: Nose normal.  Mouth/Throat: Uvula is midline and oropharynx is clear and moist.  Neck: Normal range of motion. Neck supple.  Cardiovascular: Normal rate, regular rhythm, normal heart sounds and normal pulses.  Pulmonary/Chest: Effort normal. She has wheezes in the right upper field, the  right middle field, the left upper field and the left middle field. She has rhonchi. She has rales in the right upper field, the right middle field, the left upper field and the left middle field.  Abdominal: Soft. Bowel sounds are normal.  Musculoskeletal: Normal range of motion.  Lymphadenopathy:       Head (right side): No submental and no submandibular adenopathy present.       Head (left side): No submental and no submandibular adenopathy present.    She has no cervical adenopathy.  Neurological: She is alert and oriented to person, place, and time.  Psychiatric: She has a normal mood and affect.  Vitals reviewed.  A: 1. Cough   2. Wheezing    P: Discussed exam findings, diagnosis etiology and medication use and indications reviewed with patient. Follow- Up and discharge instructions provided. No emergent/urgent issues found on exam.  Patient verbalized understanding of information provided and agrees with plan of care (POC), all questions answered.  Discussed using medication prescribed today for supportive and symptomatic relief of cough and congestion symptoms. Will consider alternate antibiotic on 11/16 of Doxy 100 mg BID if symptoms are unimproved.   1. Cough - predniSONE (DELTASONE) 20 MG tablet; Take 2 tablets (40 mg total) by mouth daily with breakfast for 5 days. - brompheniramine-pseudoephedrine-DM 30-2-10 MG/5ML syrup; Take 10 mLs by mouth 3 (three) times daily as needed. - albuterol (PROVENTIL HFA;VENTOLIN HFA) 108 (90 Base) MCG/ACT inhaler; Inhale 2 puffs into the lungs every 6 (six) hours as needed for wheezing or shortness of breath.  2. Wheezing - predniSONE (DELTASONE) 20 MG tablet; Take 2 tablets (40 mg total) by mouth daily with  breakfast for 5 days. - albuterol (PROVENTIL HFA;VENTOLIN HFA) 108 (90 Base) MCG/ACT inhaler; Inhale 2 puffs into the lungs every 6 (six) hours as needed for wheezing or shortness of breath.

## 2018-03-22 NOTE — Patient Instructions (Signed)
Cough, Adult  Coughing is a reflex that clears your throat and your airways. Coughing helps to heal and protect your lungs. It is normal to cough occasionally, but a cough that happens with other symptoms or lasts a long time may be a sign of a condition that needs treatment. A cough may last only 2-3 weeks (acute), or it may last longer than 8 weeks (chronic).  What are the causes?  Coughing is commonly caused by:   Breathing in substances that irritate your lungs.   A viral or bacterial respiratory infection.   Allergies.   Asthma.   Postnasal drip.   Smoking.   Acid backing up from the stomach into the esophagus (gastroesophageal reflux).   Certain medicines.   Chronic lung problems, including COPD (or rarely, lung cancer).   Other medical conditions such as heart failure.    Follow these instructions at home:  Pay attention to any changes in your symptoms. Take these actions to help with your discomfort:   Take medicines only as told by your health care provider.  ? If you were prescribed an antibiotic medicine, take it as told by your health care provider. Do not stop taking the antibiotic even if you start to feel better.  ? Talk with your health care provider before you take a cough suppressant medicine.   Drink enough fluid to keep your urine clear or pale yellow.   If the air is dry, use a cold steam vaporizer or humidifier in your bedroom or your home to help loosen secretions.   Avoid anything that causes you to cough at work or at home.   If your cough is worse at night, try sleeping in a semi-upright position.   Avoid cigarette smoke. If you smoke, quit smoking. If you need help quitting, ask your health care provider.   Avoid caffeine.   Avoid alcohol.   Rest as needed.    Contact a health care provider if:   You have new symptoms.   You cough up pus.   Your cough does not get better after 2-3 weeks, or your cough gets worse.   You cannot control your cough with suppressant  medicines and you are losing sleep.   You develop pain that is getting worse or pain that is not controlled with pain medicines.   You have a fever.   You have unexplained weight loss.   You have night sweats.  Get help right away if:   You cough up blood.   You have difficulty breathing.   Your heartbeat is very fast.  This information is not intended to replace advice given to you by your health care provider. Make sure you discuss any questions you have with your health care provider.  Document Released: 10/22/2010 Document Revised: 10/01/2015 Document Reviewed: 07/02/2014  Elsevier Interactive Patient Education  2018 [employer]

## 2018-06-01 ENCOUNTER — Encounter: Payer: Self-pay | Admitting: Family Medicine

## 2018-06-01 ENCOUNTER — Ambulatory Visit: Payer: Self-pay | Admitting: Family Medicine

## 2018-06-01 VITALS — BP 120/85 | HR 96 | Temp 98.9°F | Resp 18 | Wt 196.2 lb

## 2018-06-01 DIAGNOSIS — L299 Pruritus, unspecified: Secondary | ICD-10-CM

## 2018-06-01 DIAGNOSIS — R0981 Nasal congestion: Secondary | ICD-10-CM

## 2018-06-01 DIAGNOSIS — L259 Unspecified contact dermatitis, unspecified cause: Secondary | ICD-10-CM

## 2018-06-01 MED ORDER — AZELASTINE HCL 0.1 % NA SOLN: 1.0000 | Freq: Two times a day (BID) | NASAL | 0 refills | Status: DC

## 2018-06-01 MED ORDER — PREDNISONE 10 MG PO TABS: ORAL_TABLET | ORAL | 0 refills | Status: DC

## 2018-06-01 MED ORDER — HYDROXYZINE HCL 10 MG PO TABS: 10.0000 mg | ORAL_TABLET | Freq: Three times a day (TID) | ORAL | 0 refills | Status: DC | PRN

## 2018-06-01 MED FILL — AZELASTINE HCL 137 MCG SPRY: 0.1 | 30 days supply | Qty: 30 | Fill #0

## 2018-06-01 MED FILL — predniSONE 10 MG (21) TBPK: 10 | 6 days supply | Qty: 21 | Fill #0

## 2018-06-01 MED FILL — hydrOXYzine HCL 10 MG TABS: 10 | 10 days supply | Qty: 30 | Fill #0

## 2018-06-01 NOTE — Patient Instructions (Signed)
Contact Dermatitis PLAN> Dizziness (x 1 resolved- hx of vertigo), headache intermittent- follow up with optometry and PCP- tylenol or Motrin scheduled x 48 hours Itching- Hydroxyzine 10-30 mg every 8 hours as needed (may cause drowsiness) Rash- oral steroid seek evaluation with derm for skin check in next 10-14 days Nasal congestion- oral allergy med (claritin), nasal spray x 1-2 weeks can use Ayr for nasal dryness as discussed Follow up urgently if symptoms of HA and dizziness return or worsen   Dermatitis is redness, soreness, and swelling (inflammation) of the skin. Contact dermatitis is a reaction to certain substances that touch the skin. Many different substances can cause contact dermatitis. There are two types of contact dermatitis:  Irritant contact dermatitis. This type is caused by something that irritates your skin, such as having dry hands from washing them too often with soap. This type does not require previous exposure to the substance for a reaction to occur. This is the most common type.  Allergic contact dermatitis. This type is caused by a substance that you are allergic to, such as poison ivy. This type occurs when you have been exposed to the substance (allergen) and develop a sensitivity to it. Dermatitis may develop soon after your first exposure to the allergen, or it may not develop until the next time you are exposed and every time thereafter. What are the causes? Irritant contact dermatitis is most commonly caused by exposure to:  Makeup.  Soaps.  Detergents.  Bleaches.  Acids.  Metal salts, such as nickel. Allergic contact dermatitis is most commonly caused by exposure to:  Poisonous plants.  Chemicals.  Jewelry.  Latex.  Medicines.  Preservatives in products, such as clothing. What increases the risk? You are more likely to develop this condition if you have:  A job that exposes you to irritants or allergens.  Certain medical conditions,  such as asthma or eczema. What are the signs or symptoms? Symptoms of this condition may occur on your body anywhere the irritant has touched you or is touched by you.  Symptoms include: ? Dryness or flaking. ? Redness. ? Cracks. ? Itching. ? Pain or a burning feeling. ? Blisters. ? Drainage of small amounts of blood or clear fluid from skin cracks. With allergic contact dermatitis, there may also be swelling in areas such as the eyelids, mouth, or genitals. How is this diagnosed? This condition is diagnosed with a medical history and physical exam.  A patch skin test may be performed to help determine the cause.  If the condition is related to your job, you may need to see an occupational medicine specialist. How is this treated? This condition is treated by checking for the cause of the reaction and protecting your skin from further contact. Treatment may also include:  Steroid creams or ointments. Oral steroid medicines may be needed in more severe cases.  Antibiotic medicines or antibacterial ointments, if a skin infection is present.  Antihistamine lotion or an antihistamine taken by mouth to ease itching.  A bandage (dressing). Follow these instructions at home: Skin care  Moisturize your skin as needed.  Apply cool compresses to the affected areas.  Try applying baking soda paste to your skin. Stir water into baking soda until it reaches a paste-like consistency.  Do not scratch your skin, and avoid friction to the affected area.  Avoid the use of soaps, perfumes, and dyes. Medicines  Take or apply over-the-counter and prescription medicines only as told by your health care provider.  If you were prescribed an antibiotic medicine, take or apply the antibiotic as told by your health care provider. Do not stop using the antibiotic even if your condition improves. Bathing  Try taking a bath with: ? Epsom salts. Follow the instructions on the packaging. You can get  these at your local pharmacy or grocery store. ? Baking soda. Pour a small amount into the bath as directed by your health care provider. ? Colloidal oatmeal. Follow the instructions on the packaging. You can get this at your local pharmacy or grocery store.  Bathe less frequently, such as every other day.  Bathe in lukewarm water. Avoid using hot water. Bandage care  If you were given a bandage (dressing), change it as told by your health care provider.  Wash your hands with soap and water before and after you change your dressing. If soap and water are not available, use hand sanitizer. General instructions  Avoid the substance that caused your reaction. If you do not know what caused it, keep a journal to try to track what caused it. Write down: ? What you eat. ? What cosmetic products you use. ? What you drink. ? What you wear in the affected area. This includes jewelry.  Check the affected areas every day for signs of infection. Check for: ? More redness, swelling, or pain. ? More fluid or blood. ? Warmth. ? Pus or a bad smell.  Keep all follow-up visits as told by your health care provider. This is important. Contact a health care provider if:  Your condition does not improve with treatment.  Your condition gets worse.  You have signs of infection such as swelling, tenderness, redness, soreness, or warmth in the affected area.  You have a fever.  You have new symptoms. Get help right away if:  You have a severe headache, neck pain, or neck stiffness.  You vomit.  You feel very sleepy.  You notice red streaks coming from the affected area.  Your bone or joint underneath the affected area becomes painful after the skin has healed.  The affected area turns darker.  You have difficulty breathing. Summary  Dermatitis is redness, soreness, and swelling (inflammation) of the skin. Contact dermatitis is a reaction to certain substances that touch the  skin.  Symptoms of this condition may occur on your body anywhere the irritant has touched you or is touched by you.  This condition is treated by figuring out what caused the reaction and protecting your skin from further contact. Treatment may also include medicines and skin care.  Avoid the substance that caused your reaction. If you do not know what caused it, keep a journal to try to track what caused it.  Contact a health care provider if your condition gets worse or you have signs of infection such as swelling, tenderness, redness, soreness, or warmth in the affected area. This information is not intended to replace advice given to you by your health care provider. Make sure you discuss any questions you have with your health care provider. Document Released: 04/22/2000 Document Revised: 11/08/2017 Document Reviewed: 11/08/2017 Elsevier Interactive Patient Education  2019 [employer].

## 2018-06-01 NOTE — Progress Notes (Signed)
Erin Marquez is a 48 y.o. female who presents today with 5 days of a red irritated area to the right cheek. She has attempted topical steroid treatment and this has not improved symptoms. She denies any chronic history of any rashes or other history of a similar condition.  She additionally complains about ear fullness and wants her ears examined. She denies smoking but does reports pets, seasonal allergies, and using the heat in her home on high recently due to the recent cold weather. She does report an episode of dizziness about a week ago along with intermittent headache. Neither condition is present at this time of exam.    Review of Systems  Constitutional: Negative for chills, fever and malaise/fatigue.  HENT: Negative for congestion, ear discharge, ear pain, hearing loss, sinus pain, sore throat and tinnitus.   Eyes: Negative.  Negative for blurred vision, double vision, photophobia, pain, discharge and redness.  Respiratory: Negative for cough, sputum production and shortness of breath.   Cardiovascular: Negative.  Negative for chest pain.  Gastrointestinal: Negative for abdominal pain, diarrhea, nausea and vomiting.  Genitourinary: Negative for dysuria, frequency, hematuria and urgency.  Musculoskeletal: Negative for myalgias.  Skin: Positive for itching and rash.       Right cheek x 1 week- trial topical steroid with little relief- d/c make up only using oil of olay moisturizer at this time.  Neurological: Positive for dizziness and headaches.       1 week ago and has since resolved. Not present today or in the last 24 hours.  Endo/Heme/Allergies: Negative.   Psychiatric/Behavioral: Negative.     Erin Marquez has a current medication list which includes the following prescription(s): acetaminophen, vitamin d3, fexofenadine, hydrocortisone valerate cream, norethindrone, fish oil, vitamin b-12, albuterol, azelastine, brompheniramine-pseudoephedrine-dm, glipizide, hydroxyzine, lisinopril,  meloxicam, metformin, multiple vitamins-minerals, ondansetron, paroxetine, and prednisone. Also is allergic to codeine and peanut oil.  Erin Marquez  has a past medical history of Diabetes mellitus without complication ([place]), Hyperlipidemia, and Hypertension. Also  has a past surgical history that includes Wisdom tooth extraction.    O: Vitals:   06/01/18 0952  BP: 120/85  Pulse: 96  Resp: 18  Temp: 98.9 F (37.2 C)  SpO2: 96%     Physical Exam Vitals signs reviewed.  Constitutional:      Appearance: She is well-developed. She is not toxic-appearing.  HENT:     Head: Normocephalic.      Comments: Erythemic rash to right cheek- rough and dry to touch- area is not raised clearly demarcated and without fluctuance or tenderness to touch. No evidence of pustules or comedones and area not consistent with hives, fungal presentation or impetigo.    Right Ear: Hearing, tympanic membrane, ear canal and external ear normal.     Left Ear: Hearing, tympanic membrane, ear canal and external ear normal.     Nose: Nose normal.     Right Sinus: No maxillary sinus tenderness or frontal sinus tenderness.     Left Sinus: No maxillary sinus tenderness or frontal sinus tenderness.     Mouth/Throat:     Pharynx: Uvula midline.  Neck:     Musculoskeletal: Normal range of motion and neck supple.  Cardiovascular:     Rate and Rhythm: Normal rate and regular rhythm.     Pulses: Normal pulses.     Heart sounds: Normal heart sounds.  Pulmonary:     Effort: Pulmonary effort is normal.     Breath sounds: Normal breath sounds.  Abdominal:  General: Bowel sounds are normal.     Palpations: Abdomen is soft.  Musculoskeletal: Normal range of motion.  Lymphadenopathy:     Head:     Right side of head: No submental or submandibular adenopathy.     Left side of head: No submental or submandibular adenopathy.     Cervical: No cervical adenopathy.  Neurological:     Mental Status: She is alert and oriented to  person, place, and time.    A: 1. Contact dermatitis, unspecified contact dermatitis type, unspecified trigger   2. Nasal congestion   3. Itching    P:  PLAN> Dizziness (x 1 resolved- hx of vertigo), headache intermittent- follow up with optometry and PCP- tylenol or Motrin scheduled x 48 hours Itching- Hydroxyzine 10-30 mg every 8 hours as needed (may cause drowsiness) Rash- oral steroid seek evaluation with derm for skin check in next 10-14 days Nasal congestion- oral allergy med (claritin), nasal spray x 1-2 weeks can use Ayr for nasal dryness as discussed Follow up urgently if symptoms of HA and dizziness return or worsen  1. Contact dermatitis, unspecified contact dermatitis type, unspecified trigger Patient has been using topical hydrocortisone valerate- advised to discontinue and replace with oral- f/u with derm advised. - predniSONE (DELTASONE) 10 MG tablet; Take medication daily with breakfast starting with 6 and 1 less each day. (6,5,4,3,2,1)  2. Nasal congestion History of seasonal allergies and long term management of allegra- she may benefits from alternative to see greater therapeutic response related management nasal congestion  Symptoms. Discussed continued prn use of Ary and maybe changing air filters and monitoring high heat levels in evenings. - azelastine (ASTELIN) 0.1 % nasal spray; Place 1 spray into both nostrils 2 (two) times daily. Use in each nostril as directed  3. Itching Discussed risk for drowsiness and driving precautions. - hydrOXYzine (ATARAX/VISTARIL) 10 MG tablet; Take 1 tablet (10 mg total) by mouth 3 (three) times daily as needed.  Other orders DISCONTINUE- education not to use on face. - hydrocortisone valerate cream (WESTCORT) 0.2 %; Apply 1 application topically 2 (two) times daily.   Discussed with patient exam findings, suspected diagnosis etiology and  reviewed recommended treatment plan and follow up, including complications and  indications for urgent medical follow up and evaluation. Medications including use and indications reviewed with patient. Patient provided relevant patient education on diagnosis and/or relevant related condition that were discussed and reviewed with patient at discharge. Patient verbalized understanding of information provided and agrees with plan of care (POC), all questions answered.

## 2018-06-19 ENCOUNTER — Other Ambulatory Visit: Payer: Self-pay

## 2018-06-19 ENCOUNTER — Emergency Department (INDEPENDENT_AMBULATORY_CARE_PROVIDER_SITE_OTHER)
Admission: EM | Admit: 2018-06-19 | Discharge: 2018-06-19 | Disposition: A | Discharge status: Home / Self Care | Payer: 59 | Source: Home / Self Care

## 2018-06-19 DIAGNOSIS — H6982 Other specified disorders of Eustachian tube, left ear: Secondary | ICD-10-CM | POA: Diagnosis not present

## 2018-06-19 DIAGNOSIS — E1159 Type 2 diabetes mellitus with other circulatory complications: Secondary | ICD-10-CM

## 2018-06-19 DIAGNOSIS — S99922A Unspecified injury of left foot, initial encounter: Secondary | ICD-10-CM | POA: Diagnosis not present

## 2018-06-19 DIAGNOSIS — I10 Essential (primary) hypertension: Secondary | ICD-10-CM

## 2018-06-19 DIAGNOSIS — H9202 Otalgia, left ear: Secondary | ICD-10-CM

## 2018-06-19 DIAGNOSIS — E118 Type 2 diabetes mellitus with unspecified complications: Secondary | ICD-10-CM | POA: Diagnosis not present

## 2018-06-19 DIAGNOSIS — I152 Hypertension secondary to endocrine disorders: Secondary | ICD-10-CM

## 2018-06-19 DIAGNOSIS — E8809 Other disorders of plasma-protein metabolism, not elsewhere classified: Secondary | ICD-10-CM

## 2018-06-19 DIAGNOSIS — R779 Abnormality of plasma protein, unspecified: Secondary | ICD-10-CM

## 2018-06-19 LAB — POCT FASTING CBG KUC MANUAL ENTRY: POCT Glucose (KUC): 394 mg/dL — AB (ref 70–99)

## 2018-06-19 MED ORDER — GLIPIZIDE 10 MG PO TABS: 10.0000 mg | ORAL_TABLET | Freq: Two times a day (BID) | ORAL | 0 refills | Status: DC

## 2018-06-19 MED ORDER — METFORMIN HCL 1000 MG PO TABS: 1000.0000 mg | ORAL_TABLET | Freq: Two times a day (BID) | ORAL | 0 refills | Status: DC

## 2018-06-19 MED ORDER — LISINOPRIL 10 MG PO TABS: 10.0000 mg | ORAL_TABLET | Freq: Every day | ORAL | 0 refills | Status: DC

## 2018-06-19 MED FILL — LISINOPRIL 10 MG TABLET: 10 | 90 days supply | Qty: 90 | Fill #0

## 2018-06-19 MED FILL — metFORMIN HCL 1000 MG TABS: 1000 | 90 days supply | Qty: 180 | Fill #0

## 2018-06-19 MED FILL — glipiZIDE 10 MG TABS: 10 | 90 days supply | Qty: 180 | Fill #0

## 2018-06-19 NOTE — ED Triage Notes (Signed)
3 weeks ago was seen at [facility], with fluid in both ears.  She also had vertigo.  It has mostly resolved, but she feels she still is having fluid and pain in her ears, mostly the left.

## 2018-06-19 NOTE — Discharge Instructions (Addendum)
Use the Flonase 2 sprays each nostril twice daily for 5 days, then once daily to try to help open the eustachian tube.  Take an antihistamine such as Claritin or Allegra or Zyrtec (loratadine, fexofenadine, or cetirizine) 1 daily.  It is mandatory that you start paying attention to your diabetes.  The diabetes sucks the fluid out of you when your sugar is this high (394) and makes all your secretions thicker.  This is adding to your nasal and ear problems probably.  The high blood sugar may be messing up your sleep also.  You can try taking some over-the-counter sleep aid such as melatonin or 1 of the other sleep medications.  Continue getting regular exercise.  It back on the metformin, Glucotrol, and cerebral.  It is very likely you need to be on 1 or 2 other agents also.  See your primary care back ASAP to get your diabetes managed.  Return if needed.

## 2018-06-19 NOTE — ED Provider Notes (Signed)
[name]    CSN: [id number] Arrival date & time: 06/19/18  1244     History   Chief Complaint Chief Complaint  Patient presents with  . Otalgia    HPI Erin Marquez is a 48 y.o. female.   HPI Patient is here for several things.  She is complaining of left ear pain.  Is been bothering her for several weeks.  They were tightening it through to see if it would get well and it did not.  She had been to another clinic.  She has some pain around into her left maxillary sinus region.  She is a little congested.  Throat is been a little dry and sore.  She is been having problems with insomnia for the last several months.  Has gone to sleeping in another room.  Life is good and she is happily married.  She is a Agricultural engineer.  She regular exercise walking.  She just does not sleep.  She does keep a note pad by her bed at night and jot things down when she thinks of them.  She is diabetic.  It turns out she has been out of her medications for several months.  She was wanting a different doctor and has not been able to get refills from her old doctor without going back so she has just quit her medicines.  She does not check her own sugars. Past Medical History:  Diagnosis Date  . Diabetes mellitus without complication ([place])   . Hyperlipidemia   . Hypertension     Patient Active Problem List   Diagnosis Date Noted  . PCOS (polycystic ovarian syndrome) 06/13/2017  . Type 2 diabetes mellitus with complication, without long-term current use of insulin ([place]) 06/13/2017  . Hypertension associated with diabetes ([place]) 06/13/2017  . Mixed diabetic hyperlipidemia associated with type 2 diabetes mellitus ([place]) 06/13/2017  . Vitamin D deficiency 06/13/2017  . Environmental and seasonal allergies 06/13/2017  . Chronic Arthralgias- bilateral hands and feet 06/13/2017  . Family history of rheumatoid arthritis 06/13/2017  . 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 Elevated liver  enzymes- ALT and AST since  06/13/2017  . Depression, recurrent ([place]) 06/16/2014  . Sciatica 05/13/2014    Past Surgical History:  Procedure Laterality Date  . WISDOM TOOTH EXTRACTION      OB History   No obstetric history on file.      Home Medications    Prior to Admission medications   Medication Sig Start Date End Date Taking? Authorizing Provider  acetaminophen (TYLENOL) 650 MG CR tablet Take 1 tablet by mouth 2 (two) times daily.    [provider]  albuterol (PROVENTIL HFA;VENTOLIN HFA) 108 (90 Base) MCG/ACT inhaler Inhale 2 puffs into the lungs every 6 (six) hours as needed for wheezing or shortness of breath. Patient not taking: Reported on 06/01/2018 03/22/18   Shella Maxim, NP  azelastine (ASTELIN) 0.1 % nasal spray Place 1 spray into both nostrils 2 (two) times daily. Use in each nostril as directed 06/01/18   Shella Maxim, NP  brompheniramine-pseudoephedrine-DM 30-2-10 MG/5ML syrup Take 10 mLs by mouth 3 (three) times daily as needed. Patient not taking: Reported on 06/01/2018 03/22/18   Shella Maxim, NP  Cholecalciferol (VITAMIN D3) 5000 units CAPS Take 1 capsule by mouth daily.    [provider]  fexofenadine (ALLEGRA) 180 MG tablet Take 180 mg by mouth daily.    [provider]  glipiZIDE (GLUCOTROL) 10 MG tablet Take 1  tablet (10 mg total) by mouth 2 (two) times daily. Patient needs office visit for further refills 06/19/18   Posey Boyer, MD  hydrocortisone valerate cream (WESTCORT) 0.2 % Apply 1 application topically 2 (two) times daily.    [provider]  hydrOXYzine (ATARAX/VISTARIL) 10 MG tablet Take 1 tablet (10 mg total) by mouth 3 (three) times daily as needed. 06/01/18   Shella Maxim, NP  lisinopril (PRINIVIL,ZESTRIL) 10 MG tablet Take 1 tablet (10 mg total) by mouth daily. Patient needs office visit for further refills. 06/19/18   Posey Boyer, MD  meloxicam (MOBIC) 15 MG tablet Take 1 tablet (15 mg total) by mouth  daily. Patient not taking: Reported on 06/01/2018 07/05/17   [name], DO  metFORMIN (GLUCOPHAGE) 1000 MG tablet Take 1 tablet (1,000 mg total) by mouth 2 (two) times daily with a meal. Patient needs office visit for further refills 06/19/18   Posey Boyer, MD  Multiple Vitamins-Minerals (WOMENS MULTIVITAMIN PO) Take 1 tablet by mouth 2 (two) times daily.    [provider]  norethindrone (AYGESTIN) 5 MG tablet Take 5 mg by mouth daily. For 10 days to start cycle    [provider]  Omega-3 Fatty Acids (FISH OIL) 1200 MG CAPS 2 cap twice daily 07/05/17   Opalski, Neoma Laming, DO  ondansetron (ZOFRAN) 4 MG tablet Take 1 tablet (4 mg total) by mouth every 6 (six) hours. Patient not taking: Reported on 06/01/2018 03/18/18   Raylene Everts, MD  PARoxetine (PAXIL) 20 MG tablet Take 1 tablet (20 mg total) by mouth daily. Patient not taking: Reported on 06/01/2018 07/05/17   [name], DO  predniSONE (DELTASONE) 10 MG tablet Take medication daily with breakfast starting with 6 and 1 less each day. [phone]) 06/01/18   Shella Maxim, NP  vitamin B-12 (CYANOCOBALAMIN) 100 MCG tablet Take 1 capsule by mouth daily.    [provider]    Family History Family History  Problem Relation Age of Onset  . Hypertension Mother   . Stroke Mother   . Diabetes Father     Social History Social History   Tobacco Use  . Smoking status: Never Smoker  . Smokeless tobacco: Never Used  Substance Use Topics  . Alcohol use: Yes    Alcohol/week: 1.0 standard drinks    Types: 1 Standard drinks or equivalent per week  . Drug use: No     Allergies   Codeine and Peanut oil   Review of Systems Review of Systems   Physical Exam Triage Vital Signs ED Triage Vitals  Enc Vitals Group     BP 06/19/18 1437 (!) 153/102     Pulse Rate 06/19/18 1437 (!) 103     Resp 06/19/18 1437 20     Temp 06/19/18 1437 98.3 F (36.8 C)     Temp Source 06/19/18 1437 Oral     SpO2  06/19/18 1437 98 %     Weight 06/19/18 1439 188 lb (85.3 kg)     Height 06/19/18 1439 5\' 5"  (1.651 m)     Head Circumference --      Peak Flow --      Pain Score 06/19/18 1438 0     Pain Loc --      Pain Edu? --      Excl. in [place]? --    No data found.  Updated Vital Signs BP (!) 153/102 (BP Location: Right Arm)   Pulse (!) 103   Temp 98.3  F (36.8 C) (Oral)   Resp 20   Ht 5\' 5"  (1.651 m)   Wt 85.3 kg   SpO2 98%   BMI 31.28 kg/m   Visual Acuity Right Eye Distance:   Left Eye Distance:   Bilateral Distance:    Right Eye Near:   Left Eye Near:    Bilateral Near:     Physical Exam No major acute distress.  Alert and oriented.  TMs normal.  Throat clear but a little dry looking.  No nasal congestion.  Sinuses mildly tender on the left maxillary.  Neck supple without nodes.  TMJ pops a little.  Chest is clear.  Heart regular without murmur.  Left fifth toe is blue medially proximal phalanx.  She stubbed it yesterday and thinks she may have fractured it and just plans to buddy tape it on her own.  UC Treatments / Results  Labs (all labs ordered are listed, but only abnormal results are displayed) Labs Reviewed  POCT FASTING CBG [facility] MANUAL ENTRY - Abnormal; Notable for the following components:      Result Value   POCT Glucose ([facility]) 394 (*)    All other components within normal limits    EKG None  Radiology No results found.  Procedures Procedures (including critical care time)  Medications Ordered in UC Medications - No data to display  Initial Impression / Assessment and Plan / UC Course  I have reviewed the triage vital signs and the nursing notes.  Pertinent labs & imaging results that were available during my care of the patient were reviewed by me and considered in my medical decision making (see chart for details).     Eustachian tube dysfunction in a patient with very uncontrolled diabetes.  Sleep disturbance may be from having to urinate a lot.  She is  thirsty all the time.  See instructions.  She will care for her own toe and we decided not to get an x-ray. Final Clinical Impressions(s) / UC Diagnoses   Final diagnoses:  Otalgia of left ear  Dysfunction of left eustachian tube  Toe injury, left, initial encounter  Type 2 diabetes mellitus with complication, without long-term current use of insulin (HCC)  Elevated blood protein     Discharge Instructions     Use the Flonase 2 sprays each nostril twice daily for 5 days, then once daily to try to help open the eustachian tube.  Take an antihistamine such as Claritin or Allegra or Zyrtec (loratadine, fexofenadine, or cetirizine) 1 daily.  It is mandatory that you start paying attention to your diabetes.  The diabetes sucks the fluid out of you when your sugar is this high (394) and makes all your secretions thicker.  This is adding to your nasal and ear problems probably.  The high blood sugar may be messing up your sleep also.  You can try taking some over-the-counter sleep aid such as melatonin or 1 of the other sleep medications.  Continue getting regular exercise.  It back on the metformin, Glucotrol, and cerebral.  It is very likely you need to be on 1 or 2 other agents also.  See your primary care back ASAP to get your diabetes managed.  Return if needed.      ED Prescriptions    Medication Sig Dispense Auth. Provider   metFORMIN (GLUCOPHAGE) 1000 MG tablet Take 1 tablet (1,000 mg total) by mouth 2 (two) times daily with a meal. Patient needs office visit for further refills 180 tablet  Posey Boyer, MD   lisinopril (PRINIVIL,ZESTRIL) 10 MG tablet Take 1 tablet (10 mg total) by mouth daily. Patient needs office visit for further refills. 90 tablet Posey Boyer, MD   glipiZIDE (GLUCOTROL) 10 MG tablet Take 1 tablet (10 mg total) by mouth 2 (two) times daily. Patient needs office visit for further refills 180 tablet Posey Boyer, MD     Controlled Substance  Prescriptions [place] Controlled Substance Registry consulted? No   Posey Boyer, MD 06/19/18 [id number]

## 2018-06-29 ENCOUNTER — Emergency Department (HOSPITAL_COMMUNITY)
Admission: EM | Admit: 2018-06-29 | Discharge: 2018-06-30 | Disposition: A | Discharge status: Home / Self Care | Payer: 59 | Attending: Emergency Medicine | Admitting: Emergency Medicine

## 2018-06-29 ENCOUNTER — Other Ambulatory Visit: Payer: Self-pay

## 2018-06-29 ENCOUNTER — Emergency Department (HOSPITAL_COMMUNITY): Payer: 59

## 2018-06-29 ENCOUNTER — Encounter (HOSPITAL_COMMUNITY): Payer: Self-pay | Admitting: Emergency Medicine

## 2018-06-29 DIAGNOSIS — R0602 Shortness of breath: Secondary | ICD-10-CM | POA: Diagnosis not present

## 2018-06-29 DIAGNOSIS — R9431 Abnormal electrocardiogram [ECG] [EKG]: Secondary | ICD-10-CM | POA: Diagnosis not present

## 2018-06-29 DIAGNOSIS — E1165 Type 2 diabetes mellitus with hyperglycemia: Secondary | ICD-10-CM | POA: Diagnosis not present

## 2018-06-29 DIAGNOSIS — R002 Palpitations: Secondary | ICD-10-CM | POA: Diagnosis not present

## 2018-06-29 DIAGNOSIS — R739 Hyperglycemia, unspecified: Secondary | ICD-10-CM

## 2018-06-29 DIAGNOSIS — I10 Essential (primary) hypertension: Secondary | ICD-10-CM | POA: Diagnosis not present

## 2018-06-29 LAB — CBC
HEMATOCRIT: 44.9 % (ref 36.0–46.0)
Hemoglobin: 14.5 g/dL (ref 12.0–15.0)
MCH: 29.2 pg (ref 26.0–34.0)
MCHC: 32.3 g/dL (ref 30.0–36.0)
MCV: 90.5 fL (ref 80.0–100.0)
Platelets: 238 10*3/uL (ref 150–400)
RBC: 4.96 MIL/uL (ref 3.87–5.11)
RDW: 12 % (ref 11.5–15.5)
WBC: 10.3 10*3/uL (ref 4.0–10.5)
nRBC: 0 % (ref 0.0–0.2)

## 2018-06-29 LAB — BASIC METABOLIC PANEL
Anion gap: 10 (ref 5–15)
BUN: 12 mg/dL (ref 6–20)
CHLORIDE: 100 mmol/L (ref 98–111)
CO2: 24 mmol/L (ref 22–32)
Calcium: 9.8 mg/dL (ref 8.9–10.3)
Creatinine, Ser: 0.79 mg/dL (ref 0.44–1.00)
GFR calc Af Amer: >60 mL/min (ref >60)
GFR calc non Af Amer: >60 mL/min (ref >60)
GLUCOSE: 293 mg/dL — AB (ref 70–99)
POTASSIUM: 4.3 mmol/L (ref 3.5–5.1)
Sodium: 134 mmol/L — ABNORMAL LOW (ref 135–145)

## 2018-06-29 NOTE — ED Triage Notes (Signed)
Co heart racing and SOB that started while watching a movie around 10pm.    BP at home 180/135 in 1 arm and 168/118 in other arm.  HR 110 at home.  Also reports blurred vision in bilateral eyes x 4 days.  Denies weakness/numbness.  Pt was off Metformin, Glipizide, and Lisinopril since November and started back on 2/15.

## 2018-06-30 DIAGNOSIS — I10 Essential (primary) hypertension: Secondary | ICD-10-CM | POA: Diagnosis not present

## 2018-06-30 DIAGNOSIS — E1165 Type 2 diabetes mellitus with hyperglycemia: Secondary | ICD-10-CM | POA: Diagnosis not present

## 2018-06-30 DIAGNOSIS — R002 Palpitations: Secondary | ICD-10-CM | POA: Diagnosis not present

## 2018-06-30 LAB — I-STAT TROPONIN, ED: Troponin i, poc: 0 ng/mL (ref 0.00–0.08)

## 2018-06-30 LAB — TSH: TSH: 5.88 u[IU]/mL — ABNORMAL HIGH (ref 0.350–4.500)

## 2018-06-30 NOTE — ED Notes (Signed)
Pt remains in waiting room. Updated on wait for treatment room. 

## 2018-06-30 NOTE — ED Provider Notes (Signed)
Emergency Department Provider Note   I have reviewed the triage vital signs and the nursing notes.   HISTORY  Chief Complaint Tachycardia and Hypertension   HPI Erin Marquez is a 48 y.o. female history of diabetes, hyperlipidemia, hypertension the presents emergency department today secondary to palpitations and shortness of breath.  Patient states she is watching movies she fell asleep and she woke up and she felt her heart beating fast and felt a little bit short of breath.  Her husband got the blood pressure cuff and checked her blood pressure was 180/135 and her heart rate was 111.  They came here for further evaluation.  At this time she is asymptomatic.  On review of systems she states she has had some intermittent headaches and some blurry vision for the last few days and has an eye doctor appointment scheduled for a few days from now.  Patient states that she not have any persistent shortness of breath, lower extremity edema, chest pain or syncope.  No rashes.  No fevers.  No cough.  No trauma. No other associated or modifying symptoms.    Past Medical History:  Diagnosis Date  . Diabetes mellitus without complication ([place])   . Hyperlipidemia   . Hypertension     Patient Active Problem List   Diagnosis Date Noted  . PCOS (polycystic ovarian syndrome) 06/13/2017  . Type 2 diabetes mellitus with complication, without long-term current use of insulin ([place]) 06/13/2017  . Hypertension associated with diabetes ([place]) 06/13/2017  . Mixed diabetic hyperlipidemia associated with type 2 diabetes mellitus ([place]) 06/13/2017  . Vitamin D deficiency 06/13/2017  . Environmental and seasonal allergies 06/13/2017  . Chronic Arthralgias- bilateral hands and feet 06/13/2017  . Family history of rheumatoid arthritis 06/13/2017  . 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 Elevated liver enzymes- ALT and AST since  06/13/2017  . Depression, recurrent ([place]) 06/16/2014  . Sciatica  05/13/2014    Past Surgical History:  Procedure Laterality Date  . WISDOM TOOTH EXTRACTION      Current Outpatient Rx  . Order #: [id number] Class: Historical Med  . Order #: [id number] Class: Normal  . Order #: [id number] Class: Normal  . Order #: [id number] Class: Normal  . Order #: [id number] Class: Historical Med  . Order #: [id number] Class: Historical Med  . Order #: [id number] Class: Normal  . Order #: [id number] Class: Historical Med  . Order #: [id number] Class: Normal  . Order #: [id number] Class: Normal  . Order #: [id number] Class: Normal  . Order #: [id number] Class: Normal  . Order #: [id number] Class: Historical Med  . Order #: [id number] Class: Historical Med  . Order #: [id number] Class: OTC  . Order #: [id number] Class: Normal  . Order #: [id number] Class: Normal  . Order #: [id number] Class: Normal  . Order #: [id number] Class: Historical Med    Allergies Codeine and Peanut oil  Family History  Problem Relation Age of Onset  . Hypertension Mother   . Stroke Mother   . Diabetes Father     Social History Social History   Tobacco Use  . Smoking status: Never Smoker  . Smokeless tobacco: Never Used  Substance Use Topics  . Alcohol use: Yes    Alcohol/week: 1.0 standard drinks    Types: 1 Standard drinks or equivalent per week  . Drug use: No    Review of Systems  All other systems negative except as documented in the HPI. All pertinent positives and negatives as reviewed in the HPI. ____________________________________________   PHYSICAL EXAM:  VITAL SIGNS: ED  Triage Vitals  Enc Vitals Group     BP 06/29/18 2313 (!) 163/101     Pulse Rate 06/29/18 2313 (!) 109     Resp 06/29/18 2313 18     Temp 06/29/18 2313 98 F (36.7 C)     Temp Source 06/29/18 2313 Oral     SpO2 06/29/18 2313 99 %     Weight 06/29/18 2314 188 lb (85.3 kg)     Height 06/29/18 2314 5\' 5"  (1.651 m)    Constitutional: Alert and oriented. Well appearing and in no acute distress. Eyes: Conjunctivae  are normal. PERRL. EOMI. Head: Atraumatic. Nose: No congestion/rhinnorhea. Mouth/Throat: Mucous membranes are moist.  Oropharynx non-erythematous. Neck: No stridor.  No meningeal signs.   Cardiovascular: Normal rate, regular rhythm. Good peripheral circulation. Grossly normal heart sounds.   Respiratory: Normal respiratory effort.  No retractions. Lungs CTAB. Gastrointestinal: Soft and nontender. No distention.  Musculoskeletal: No lower extremity tenderness nor edema. No gross deformities of extremities. Neurologic:  Normal speech and language. No gross focal neurologic deficits are appreciated.  Skin:  Skin is warm, dry and intact. No rash noted.   ____________________________________________   LABS (all labs ordered are listed, but only abnormal results are displayed)  Labs Reviewed  BASIC METABOLIC PANEL - Abnormal; Notable for the following components:      Result Value   Sodium 134 (*)    Glucose, Bld 293 (*)    All other components within normal limits  CBC  TSH  I-STAT TROPONIN, ED   ____________________________________________  EKG   EKG Interpretation  Date/Time:  Friday June 29 2018 23:12:57 EST Ventricular Rate:  116 PR Interval:  158 QRS Duration: 90 QT Interval:  338 QTC Calculation: 469 R Axis:   138 Text Interpretation:  Sinus tachycardia Anterolateral infarct , age undetermined Abnormal ECG No old tracing to compare Confirmed by Merrily Pew [phone]) on 06/30/2018 4:22:40 AM Also confirmed by Merrily Pew [phone]), editor Philomena Doheny [phone])  on 06/30/2018 9:03:29 AM       ____________________________________________  RADIOLOGY  Dg Chest 2 View  Result Date: 06/29/2018 CLINICAL DATA:  Shortness of breath EXAM: CHEST - 2 VIEW COMPARISON:  None. FINDINGS: The heart size and mediastinal contours are within normal limits. Both lungs are clear. The visualized skeletal structures are unremarkable. IMPRESSION: No active cardiopulmonary disease.  Electronically Signed   By: Donavan Foil M.D.   On: 06/29/2018 23:26    ____________________________________________   PROCEDURES  Procedure(s) performed:   Procedures   ____________________________________________   INITIAL IMPRESSION / ASSESSMENT AND PLAN / ED COURSE  Unclear the etiology of her symptoms.  She did drink a large Pepsi prior to this happening could be related to caffeine.  She could have had a bad dream that caused it.  She may need adjustments of her medications however blood pressure is 133 at the lowest here at the highest here was 163.  I will not adjust her medications at this time she has follow-up with her primary care doctor soon.  I discussed with her possible doing a CT scan secondary to the high blood pressure and blurry vision to evaluate for possible pituitary issues or other mass lesions in her brain however she prefers to follow-up with her eye doctor prior to imaging in case it is just her eyes.  Will return for any new or worsening symptoms will keep a log of her blood pressures and blood sugars.     Pertinent labs & imaging results that  were available during my care of the patient were reviewed by me and considered in my medical decision making (see chart for details).  ____________________________________________  FINAL CLINICAL IMPRESSION(S) / ED DIAGNOSES  Final diagnoses:  None     MEDICATIONS GIVEN DURING THIS VISIT:  Medications - No data to display   NEW OUTPATIENT MEDICATIONS STARTED DURING THIS VISIT:  New Prescriptions   No medications on file    Note:  This note was prepared with assistance of Dragon voice recognition software. Occasional wrong-word or sound-a-like substitutions may have occurred due to the inherent limitations of voice recognition software.   Oryn Casanova, Corene Cornea, MD 07/01/18 [phone]

## 2018-06-30 NOTE — ED Notes (Signed)
Pt remains in waiting room. Updated on wait for treatment room.  Pt states she is feeling better now since all the [facility] officers and family for the traumas have left.

## 2018-07-02 DIAGNOSIS — H5213 Myopia, bilateral: Secondary | ICD-10-CM | POA: Diagnosis not present

## 2018-07-02 DIAGNOSIS — H52223 Regular astigmatism, bilateral: Secondary | ICD-10-CM | POA: Diagnosis not present

## 2018-07-02 DIAGNOSIS — H524 Presbyopia: Secondary | ICD-10-CM | POA: Diagnosis not present

## 2018-07-02 LAB — HM DIABETES EYE EXAM

## 2018-07-04 ENCOUNTER — Ambulatory Visit (INDEPENDENT_AMBULATORY_CARE_PROVIDER_SITE_OTHER): Payer: 59 | Admitting: Primary Care

## 2018-07-04 ENCOUNTER — Encounter: Payer: Self-pay | Admitting: Primary Care

## 2018-07-04 ENCOUNTER — Other Ambulatory Visit: Payer: Self-pay | Admitting: Primary Care

## 2018-07-04 VITALS — BP 122/82 | HR 74 | Temp 98.2°F | Ht 65.0 in | Wt 187.2 lb

## 2018-07-04 DIAGNOSIS — F339 Major depressive disorder, recurrent, unspecified: Secondary | ICD-10-CM

## 2018-07-04 DIAGNOSIS — E1169 Type 2 diabetes mellitus with other specified complication: Secondary | ICD-10-CM | POA: Diagnosis not present

## 2018-07-04 DIAGNOSIS — E118 Type 2 diabetes mellitus with unspecified complications: Secondary | ICD-10-CM

## 2018-07-04 DIAGNOSIS — E282 Polycystic ovarian syndrome: Secondary | ICD-10-CM

## 2018-07-04 DIAGNOSIS — Z1239 Encounter for other screening for malignant neoplasm of breast: Secondary | ICD-10-CM | POA: Diagnosis not present

## 2018-07-04 DIAGNOSIS — E1159 Type 2 diabetes mellitus with other circulatory complications: Secondary | ICD-10-CM | POA: Diagnosis not present

## 2018-07-04 DIAGNOSIS — E782 Mixed hyperlipidemia: Secondary | ICD-10-CM | POA: Diagnosis not present

## 2018-07-04 DIAGNOSIS — I152 Hypertension secondary to endocrine disorders: Secondary | ICD-10-CM

## 2018-07-04 DIAGNOSIS — J3089 Other allergic rhinitis: Secondary | ICD-10-CM | POA: Diagnosis not present

## 2018-07-04 DIAGNOSIS — I10 Essential (primary) hypertension: Secondary | ICD-10-CM

## 2018-07-04 DIAGNOSIS — R7989 Other specified abnormal findings of blood chemistry: Secondary | ICD-10-CM | POA: Insufficient documentation

## 2018-07-04 LAB — LIPID PANEL
CHOLESTEROL: 295 mg/dL — AB (ref 0–200)
HDL: 45.1 mg/dL (ref >39.00)
NonHDL: 249.73
Total CHOL/HDL Ratio: 7
Triglycerides: 319 mg/dL — ABNORMAL HIGH (ref 0.0–149.0)
VLDL: 63.8 mg/dL — AB (ref 0.0–40.0)

## 2018-07-04 LAB — COMPREHENSIVE METABOLIC PANEL
ALBUMIN: 4.7 g/dL (ref 3.5–5.2)
ALK PHOS: 42 U/L (ref 39–117)
ALT: 31 U/L (ref 0–35)
AST: 29 U/L (ref 0–37)
BILIRUBIN TOTAL: 1.1 mg/dL (ref 0.2–1.2)
BUN: 22 mg/dL (ref 6–23)
CO2: 23 mEq/L (ref 19–32)
Calcium: 10 mg/dL (ref 8.4–10.5)
Chloride: 100 mEq/L (ref 96–112)
Creatinine, Ser: 0.9 mg/dL (ref 0.40–1.20)
GFR: 67.59 mL/min (ref >60.00)
Glucose, Bld: 150 mg/dL — ABNORMAL HIGH (ref 70–99)
POTASSIUM: 4.3 meq/L (ref 3.5–5.1)
SODIUM: 135 meq/L (ref 135–145)
Total Protein: 8.2 g/dL (ref 6.0–8.3)

## 2018-07-04 LAB — LDL CHOLESTEROL, DIRECT: LDL DIRECT: 216 mg/dL

## 2018-07-04 LAB — HEMOGLOBIN A1C: HEMOGLOBIN A1C: 14.2 % — AB (ref 4.6–6.5)

## 2018-07-04 MED ORDER — BLOOD GLUCOSE MONITOR KIT: PACK | 0 refills | Status: AC

## 2018-07-04 MED ORDER — INSULIN GLARGINE 100 UNIT/ML SOLOSTAR PEN: 15.0000 [IU] | PEN_INJECTOR | Freq: Every day | SUBCUTANEOUS | 5 refills | Status: DC

## 2018-07-04 MED ORDER — PEN NEEDLES 31G X 6 MM MISC: 5 refills | Status: DC

## 2018-07-04 MED ORDER — ATORVASTATIN CALCIUM 40 MG PO TABS: 40.0000 mg | ORAL_TABLET | Freq: Every day | ORAL | 3 refills | Status: DC

## 2018-07-04 MED FILL — FREESTYLE LANCETS: 25 days supply | Qty: 100 | Fill #0

## 2018-07-04 MED FILL — FREESTYLE LITE TEST STRIP: 25 days supply | Qty: 100 | Fill #0

## 2018-07-04 MED FILL — FREESTYLE LITE METER: 1 days supply | Qty: 1 | Fill #0

## 2018-07-04 NOTE — Assessment & Plan Note (Signed)
Previously following with GYN in [place], provided information for local gynecologist.

## 2018-07-04 NOTE — Assessment & Plan Note (Signed)
Doing well on current regimen, continue same. 

## 2018-07-04 NOTE — Assessment & Plan Note (Signed)
Stable in the office today, continue lisinopril 10 mg daily. CMP pending.

## 2018-07-04 NOTE — Assessment & Plan Note (Signed)
Did not get to discuss in detail today, patient did discontinue Paxil in November 2019. Overall doing well off medication.

## 2018-07-04 NOTE — Assessment & Plan Note (Signed)
Uncontrolled based off of recent glucose readings. A1c pending. Long discussion today regarding symptoms of blurred vision, polyphagia, headaches, fatigue and how they are likely related to uncontrolled diabetes.  We discussed that in order to gain quick control of her diabetes and to reduce her symptoms we would likely need to initiate long-acting insulin in addition to her glipizide and metformin.  She was very receptive as she is ready to feel better.  We will start by rechecking A1c today and dose insulin off of the result. She will start monitoring glucose levels 2-3 times daily, rotating times of checks.  Glucose logs provided today. Continue metformin and glipizide. She did undergo an eye exam recently. Foot exam next visit.  We will see her back in 3 weeks with glucose logs.  We will be in touch with her tomorrow regarding her A1c and instructions for insulin.  Prescription for glucometer kit provided to her today as she needs a new meter and supplies.

## 2018-07-04 NOTE — Assessment & Plan Note (Signed)
Recent TSH of 5.88 several days ago.  TSH 1 year ago within normal limits. She does have a family history of hypothyroidism in father. Given her uncontrolled diabetes and numerous other symptoms we will start by repeating TSH during her next visit.

## 2018-07-04 NOTE — Progress Notes (Signed)
Subjective:    Patient ID: Erin Marquez, female    DOB: Jun 26, 1970, 48 y.o.   MRN: [id number]  HPI  Erin Marquez is a 48 year old female who presents today to establish care and discuss the problems mentioned below. Will obtain old records.  1) Type 2 Diabetes: Previously managed on Glipizide 10 mg BID, Metformin 1000 mg BID. Her last A1C was 8.7 in February 2019. She stopped taking her medications in November 2019, resumed on 06/23/18.  She is checking her glucose infrequently. Her reading three days ago before lunch which was 500. She checked her glucose this morning before eating which was 505, one hour later glucose was 450.   She endorses a fair diet overall, she does indulge in soda, candy, starchy food, restaurant food.  She has noted blurry vision over the last week.  Wt Readings from Last 3 Encounters:  07/04/18 187 lb 4 oz (84.9 kg)  06/29/18 188 lb (85.3 kg)  06/19/18 188 lb (85.3 kg)     2) Hyperlipidemia: Previously managed on atorvastatin 20 mg for which she stopped taking in November 2019 and has not resumed. Her last lipid panel was in February 2019 with TC of 151, LDL of 68, Trigs of 214. She is taking Fish Oil 3000 mg daily.   3) Essential Hypertension: Currently managed on Lisinopril 10 mg which was started on 06/23/18. Was managed on lisinopril in the past but stopped taking in November 2019. She was evaluated in the ED on 06/30/18 with complaints of tachycardia, blurred vision and hypertension. BP was 180/135 prior to arrival with HR of 111. Work up was overall unremarkable in the ED with the exception of elevated TSH. BP systolic ranged from 568-127'N during her stay.  It was recommended she undergo CT head for symptoms of blurred vision to rule out other causes, she kindly declined.  BP Readings from Last 3 Encounters:  07/04/18 122/82  06/30/18 140/84  06/19/18 (!) 153/102   She endorses headaches, blurred vision for the last week. Was evaluated by her eye doctor  earlier this week, was provided with a new Rx for glasses for which she has not yet filled.  4) Allergic Rhinitis: Currently managed on Allegra, Astelin nasal spray.  Overall feels well managed.  5) Abnormal TSH: TSH from 06/30/18 of 5.88. TSH from records one year ago of 2.02. She denies a personal history of hypothyroidism. She has a family history of hypothyroidism in her father and paternal aunt.  6) PCOS: Previously following with GYN in [place]. Managed on norethindrone 5 mg for which she uses as needed if she doesn't have a menstrual cycle for three months.  She is looking for a new gynecologist locally.  Review of Systems  Constitutional: Negative for unexpected weight change.  Eyes: Positive for visual disturbance.  Respiratory: Negative for shortness of breath.   Cardiovascular: Negative for chest pain.  Gastrointestinal: Negative for abdominal pain.  Endocrine: Positive for polyphagia.  Genitourinary:       Irregular menstrual cycles, history of PCOS.  Skin: Negative for color change.  Neurological: Positive for headaches.  Psychiatric/Behavioral: The patient is nervous/anxious.        Past Medical History:  Diagnosis Date  . Diabetes mellitus without complication ([place])   . Hyperlipidemia   . Hypertension      Social History   Socioeconomic History  . Marital status: Married    Spouse name: Not on file  . Number of children: Not on file  .  Years of education: Not on file  . Highest education level: Not on file  Occupational History  . Not on file  Social Needs  . Financial resource strain: Not on file  . Food insecurity:    Worry: Not on file    Inability: Not on file  . Transportation needs:    Medical: Not on file    Non-medical: Not on file  Tobacco Use  . Smoking status: Never Smoker  . Smokeless tobacco: Never Used  Substance and Sexual Activity  . Alcohol use: Yes    Alcohol/week: 1.0 standard drinks    Types: 1 Standard drinks or equivalent  per week  . Drug use: No  . Sexual activity: Yes    Birth control/protection: None  Lifestyle  . Physical activity:    Days per week: Not on file    Minutes per session: Not on file  . Stress: Not on file  Relationships  . Social connections:    Talks on phone: Not on file    Gets together: Not on file    Attends religious service: Not on file    Active member of club or organization: Not on file    Attends meetings of clubs or organizations: Not on file    Relationship status: Not on file  . Intimate partner violence:    Fear of current or ex partner: Not on file    Emotionally abused: Not on file    Physically abused: Not on file    Forced sexual activity: Not on file  Other Topics Concern  . Not on file  Social History Narrative  . Not on file    Past Surgical History:  Procedure Laterality Date  . WISDOM TOOTH EXTRACTION      Family History  Problem Relation Age of Onset  . Hypertension Mother   . Stroke Mother   . Diabetes Father     Allergies  Allergen Reactions  . Codeine Nausea Only  . Peanut Oil     Slurred speech    Current Outpatient Medications on File Prior to Visit  Medication Sig Dispense Refill  . acetaminophen (TYLENOL) 650 MG CR tablet Take 1 tablet by mouth 2 (two) times daily.    Marland Kitchen azelastine (ASTELIN) 0.1 % nasal spray Place 1 spray into both nostrils 2 (two) times daily. Use in each nostril as directed 30 mL 0  . Cholecalciferol (VITAMIN D3) 5000 units CAPS Take 1 capsule by mouth daily.    . fexofenadine (ALLEGRA) 180 MG tablet Take 180 mg by mouth daily.    Marland Kitchen glipiZIDE (GLUCOTROL) 10 MG tablet Take 1 tablet (10 mg total) by mouth 2 (two) times daily. Patient needs office visit for further refills 180 tablet 0  . hydrocortisone valerate cream (WESTCORT) 0.2 % Apply 1 application topically 2 (two) times daily.    Marland Kitchen lisinopril (PRINIVIL,ZESTRIL) 10 MG tablet Take 1 tablet (10 mg total) by mouth daily. Patient needs office visit for further  refills. 90 tablet 0  . metFORMIN (GLUCOPHAGE) 1000 MG tablet Take 1 tablet (1,000 mg total) by mouth 2 (two) times daily with a meal. Patient needs office visit for further refills 180 tablet 0  . Multiple Vitamins-Minerals (WOMENS MULTIVITAMIN PO) Take 1 tablet by mouth 2 (two) times daily.    . norethindrone (AYGESTIN) 5 MG tablet Take 5 mg by mouth daily. For 10 days to start cycle    . Omega-3 Fatty Acids (FISH OIL) 1200 MG CAPS 2 cap twice daily    .  vitamin B-12 (CYANOCOBALAMIN) 100 MCG tablet Take 1 capsule by mouth daily.    Marland Kitchen atorvastatin (LIPITOR) 20 MG tablet Take 20 mg by mouth daily.     No current facility-administered medications on file prior to visit.     BP 122/82   Pulse 74   Temp 98.2 F (36.8 C) (Oral)   Ht 5\' 5"  (1.651 m)   Wt 187 lb 4 oz (84.9 kg)   LMP 06/09/2018   SpO2 99%   BMI 31.16 kg/m    Objective:   Physical Exam  Constitutional: She is oriented to person, place, and time. She appears well-nourished.  Eyes: EOM are normal.  Neck: Neck supple.  Cardiovascular: Normal rate and regular rhythm.  Respiratory: Effort normal and breath sounds normal.  Neurological: She is alert and oriented to person, place, and time. No cranial nerve deficit.  Skin: Skin is warm and dry.  Psychiatric: She has a normal mood and affect.           Assessment & Plan:

## 2018-07-04 NOTE — Assessment & Plan Note (Signed)
Repeat lipid panel pending. We will need to re-initiate statin, await results.

## 2018-07-04 NOTE — Patient Instructions (Addendum)
Center for [employer] at [facility] for gynecology.  Stop by the lab prior to leaving today. I will notify you of your results once received.   Take the glucometer kit prescription to the pharmacy. Start check your blood sugars 2-3 times daily as discussed, rotating checks.   You will be contacted regarding your referral to diabetes education and nutrition.  Please let us know if you have not been contacted within one week.   Schedule a follow up visit for 3 weeks for diabetes check. Bring your glucose logs.  Please call me if your vision becomes worse after diabetes treatment.   It was a pleasure to meet you today! Please don't hesitate to call or message me with any questions. Welcome to [facility]!

## 2018-07-05 MED FILL — ATORVASTATIN 40 MG TABLET: 40 | 90 days supply | Qty: 90 | Fill #0

## 2018-07-05 MED FILL — LANTUS SOLOSTAR 100 UNITS/M: 100 | 30 days supply | Qty: 6 | Fill #0

## 2018-07-05 MED FILL — UNIFINE PENTIPS 6MM 31G: 31G X 6 MM | 90 days supply | Qty: 100 | Fill #0

## 2018-07-13 ENCOUNTER — Ambulatory Visit
Admission: RE | Admit: 2018-07-13 | Discharge: 2018-07-13 | Disposition: A | Discharge status: Home / Self Care | Payer: 59 | Source: Ambulatory Visit | Attending: Primary Care | Admitting: Primary Care

## 2018-07-13 DIAGNOSIS — Z1239 Encounter for other screening for malignant neoplasm of breast: Secondary | ICD-10-CM

## 2018-07-13 DIAGNOSIS — Z1231 Encounter for screening mammogram for malignant neoplasm of breast: Secondary | ICD-10-CM | POA: Diagnosis not present

## 2018-07-24 ENCOUNTER — Encounter: Payer: Self-pay | Admitting: Radiology

## 2018-07-24 ENCOUNTER — Other Ambulatory Visit: Payer: Self-pay | Admitting: Primary Care

## 2018-07-24 MED FILL — FREESTYLE LITE TEST STRIP: 75 days supply | Qty: 300 | Fill #0

## 2018-07-25 ENCOUNTER — Ambulatory Visit (INDEPENDENT_AMBULATORY_CARE_PROVIDER_SITE_OTHER): Payer: 59 | Admitting: Primary Care

## 2018-07-25 ENCOUNTER — Encounter: Payer: Self-pay | Admitting: Primary Care

## 2018-07-25 ENCOUNTER — Other Ambulatory Visit: Payer: Self-pay

## 2018-07-25 ENCOUNTER — Encounter: Payer: 59 | Attending: Primary Care | Admitting: Registered"

## 2018-07-25 ENCOUNTER — Encounter: Payer: Self-pay | Admitting: Registered"

## 2018-07-25 VITALS — BP 120/82 | HR 78 | Temp 98.2°F | Ht 65.0 in | Wt 188.5 lb

## 2018-07-25 DIAGNOSIS — E118 Type 2 diabetes mellitus with unspecified complications: Secondary | ICD-10-CM | POA: Diagnosis not present

## 2018-07-25 DIAGNOSIS — E782 Mixed hyperlipidemia: Secondary | ICD-10-CM | POA: Diagnosis not present

## 2018-07-25 DIAGNOSIS — E1169 Type 2 diabetes mellitus with other specified complication: Secondary | ICD-10-CM | POA: Diagnosis not present

## 2018-07-25 MED ORDER — INSULIN GLARGINE 100 UNIT/ML SOLOSTAR PEN: 5.0000 [IU] | PEN_INJECTOR | Freq: Every day | SUBCUTANEOUS | 5 refills | Status: DC

## 2018-07-25 MED ORDER — GLUCOSE BLOOD VI STRP: ORAL_STRIP | 2 refills | Status: AC

## 2018-07-25 NOTE — Progress Notes (Signed)
Subjective:    Patient ID: Erin Marquez, female    DOB: Jan 16, 1971, 48 y.o.   MRN: [id number]  HPI  Erin Marquez is a 48 year old female who presents today for follow up of diabetes.   Current medications include: Lantus 10 units HS, glipizide 10 mg BID, Metformin 1000 mg BID.   She is checking her blood glucose 4-5  times daily and is getting readings of: AM fasting: 81-183 2 hours after breakfast: 103-158 Before lunch: 35-117 2 hours after lunch: 88-171 Before dinner: 70-176 2 hours after dinner: 80-174  Last A1C: 14.2 Last Eye Exam: Completed in February 2020 Last Foot Exam: Due today  Pneumonia Vaccination: Completed in 2016 ACE/ARB: lisinopril  Statin: atorvastatin   Diet currently consists of:  Breakfast: Eggs, bacon, toast Lunch: Salad with protein, fruit Dinner: Meat, starch, vegetables, salad Snacks: Fruit, cheese stick, crackers, yogurt, nuts Desserts: Rarely  Beverages: Water, pepsi if glucose is too low  Exercise: 1 hour daily of walking   Review of Systems  Eyes:       Vision improved since last visit   Respiratory: Negative for shortness of breath.   Cardiovascular: Negative for chest pain.  Musculoskeletal:       Chronic foot pain, dorsal and plantar  Neurological: Positive for numbness.       Past Medical History:  Diagnosis Date  . Diabetes mellitus without complication ([place])   . Hyperlipidemia   . Hypertension      Social History   Socioeconomic History  . Marital status: Married    Spouse name: Not on file  . Number of children: Not on file  . Years of education: Not on file  . Highest education level: Not on file  Occupational History  . Not on file  Social Needs  . Financial resource strain: Not on file  . Food insecurity:    Worry: Not on file    Inability: Not on file  . Transportation needs:    Medical: Not on file    Non-medical: Not on file  Tobacco Use  . Smoking status: Never Smoker  . Smokeless tobacco: Never Used   Substance and Sexual Activity  . Alcohol use: Yes    Alcohol/week: 1.0 standard drinks    Types: 1 Standard drinks or equivalent per week  . Drug use: No  . Sexual activity: Yes    Birth control/protection: None  Lifestyle  . Physical activity:    Days per week: Not on file    Minutes per session: Not on file  . Stress: Not on file  Relationships  . Social connections:    Talks on phone: Not on file    Gets together: Not on file    Attends religious service: Not on file    Active member of club or organization: Not on file    Attends meetings of clubs or organizations: Not on file    Relationship status: Not on file  . Intimate partner violence:    Fear of current or ex partner: Not on file    Emotionally abused: Not on file    Physically abused: Not on file    Forced sexual activity: Not on file  Other Topics Concern  . Not on file  Social History Narrative  . Not on file    Past Surgical History:  Procedure Laterality Date  . WISDOM TOOTH EXTRACTION      Family History  Problem Relation Age of Onset  . Hypertension Mother   .  Stroke Mother   . Diabetes Father   . Hyperthyroidism Father     Allergies  Allergen Reactions  . Codeine Nausea Only  . Peanut Oil     Slurred speech    Current Outpatient Medications on File Prior to Visit  Medication Sig Dispense Refill  . acetaminophen (TYLENOL) 650 MG CR tablet Take 1 tablet by mouth 2 (two) times daily.    Marland Kitchen atorvastatin (LIPITOR) 40 MG tablet Take 1 tablet (40 mg total) by mouth daily. For cholesterol. 90 tablet 3  . blood glucose meter kit and supplies KIT Dispense based on patient and insurance preference. Use up to four times daily as directed. (FOR ICD-9 250.00, 250.01). 1 each 0  . Cholecalciferol (VITAMIN D3) 5000 units CAPS Take 1 capsule by mouth daily.    . fexofenadine (ALLEGRA) 180 MG tablet Take 180 mg by mouth daily.    Marland Kitchen glipiZIDE (GLUCOTROL) 10 MG tablet Take 1 tablet (10 mg total) by mouth 2  (two) times daily. Patient needs office visit for further refills 180 tablet 0  . Insulin Pen Needle (PEN NEEDLES) 31G X 6 MM MISC Use nightly with insulin. 100 each 5  . lisinopril (PRINIVIL,ZESTRIL) 10 MG tablet Take 1 tablet (10 mg total) by mouth daily. Patient needs office visit for further refills. 90 tablet 0  . metFORMIN (GLUCOPHAGE) 1000 MG tablet Take 1 tablet (1,000 mg total) by mouth 2 (two) times daily with a meal. Patient needs office visit for further refills 180 tablet 0  . norethindrone (AYGESTIN) 5 MG tablet Take 5 mg by mouth daily. For 10 days to start cycle    . Omega-3 Fatty Acids (FISH OIL) 1200 MG CAPS 2 cap twice daily    . vitamin B-12 (CYANOCOBALAMIN) 100 MCG tablet Take 1 capsule by mouth daily.     No current facility-administered medications on file prior to visit.     BP 120/82   Pulse 78   Temp 98.2 F (36.8 C) (Oral)   Ht '5\' 5"'  (1.651 m)   Wt 188 lb 8 oz (85.5 kg)   LMP 07/06/2018   SpO2 98%   BMI 31.37 kg/m    Objective:   Physical Exam  Constitutional: She appears well-nourished.  Neck: Neck supple.  Cardiovascular: Normal rate and regular rhythm.  Respiratory: Effort normal and breath sounds normal.  Skin: Skin is warm and dry.  Psychiatric: She has a normal mood and affect.           Assessment & Plan:

## 2018-07-25 NOTE — Patient Instructions (Addendum)
We've reduced your Lantus to 5 units nightly. Continue Glipizide 10 mg twice daily and Metformin 1000 mg twice daily.  Schedule a follow up visit for on or after May 26th for diabetes check.  It was a pleasure to see you today!

## 2018-07-25 NOTE — Progress Notes (Signed)
Diabetes Self-Management Education  Visit Type: First/Initial  Appt. Start Time: 1020 Appt. End Time: 7939  07/25/2018  Ms. Erin Marquez, identified by name and date of birth, is a 48 y.o. female with a diagnosis of Diabetes: Type 2.   ASSESSMENT  Last menstrual period 07/06/2018. There is no height or weight on file to calculate BMI.   Pt states since she started insulin she has been experiencing frequent hypoglycemia, one time was in mid 30's. Pt states after she was able to bring her BG back up she felt terrible the rest of the day. Pt states her doctor has reduced her Lantus. Pt states it always happens in the late morning before lunch. Pt usually has minimal carbs for breakfast, usually has a snack, but sometimes not. Pt states she does not like [place] yogurt.  Pt states she was not aware of the rule of 15 to treat hypoglycemia and was 35 mg/dL before lunch and she drank 1/2 pepsi, ate a brownie and immediately ate lunch and it took a long time to bring up BG.  Pt states she has been taking metformin in 20s due to PCOS diagnosis and was told she was at risk for developing T2DM. Pt states she has Hx of GDM.   Pt states her medication prescriptions ran out in November and she stopped taking because she was waiting until she found another PCP to get refills and just got busy with responsibilities taking care of family. Pt states she was experiencing extreme thirst and was drinking a lot of regular soda and water. Pt states insulin was added to the metformin and glipizide but states her doctor has a goal of taking her off insulin.   Pt states she believes her BG is also affected by her sleep frequently being interrupted. Pt states this is due to being a light sleeper as well as increased pain lately in her feet. Pt states she also has some tingling and just had the neuropathy test at her doctor and she was okay. Pt states dog sleeps with them as well.  Pt reports that she tries to follow what  she remembers from the diabetes classes she attended several years ago, has been trying to limit carbs to 1 choice at a meal. Pt states she does not want to restrict too much because she is aware it can lead to bingeing. Pt states when she is stressed pepsi and chocolate are her comfort foods.   Pt states she eats eggs every day but would like to have other options and concerned about cholesterol.  Diabetes Self-Management Education - 07/25/18 1040      Visit Information   Visit Type  First/Initial      Initial Visit   Diabetes Type  Type 2    Are you currently following a meal plan?  Yes    What type of meal plan do you follow?  counting carbs    Are you taking your medications as prescribed?  Yes   metformin, lantus, glimepiride   Date Diagnosed  2016      Health Coping   How would you rate your overall health?  Good      Psychosocial Assessment   Patient Belief/Attitude about Diabetes  Other (comment)   motivated to find the right foods to manage   How often do you need to have someone help you when you read instructions, pamphlets, or other written materials from your doctor or pharmacy?  1 - Never  What is the last grade level you completed in school?  2 yrs associates      Complications   Last HgB A1C per patient/outside source  14 %    How often do you check your blood sugar?  > 4 times/day    Fasting Blood glucose range (mg/dL)  70-129;180-200   81-183   Postprandial Blood glucose range (mg/dL)  70-129;130-179   80-174   Number of hypoglycemic episodes per month  4   started after lantus started   Can you tell when your blood sugar is low?  Yes    What do you do if your blood sugar is low?  6 oz pepsi    Number of hyperglycemic episodes per week  1    Can you tell when your blood sugar is high?  Yes    What do you do if your blood sugar is high?  try to figure out what made it go high    Have you had a dilated eye exam in the past 12 months?  Yes    Have you had a  dental exam in the past 12 months?  Yes    Are you checking your feet?  No      Dietary Intake   Breakfast  egg, bacon, toast    Snack (morning)  1/s apple, cheese    Lunch  grilled chicken salad, tostitos, strawberries OR 1/2 sandwich and side salad    Snack (afternoon)  chocolate when stressed    Dinner  (handful) pasta, meat, green beans, salad, sometimes 1/2 garlic toast OR beef stew, potatoes, carrots with salad    Snack (evening)  1/2 honey crisp apple, cheese OR [employer] crackers OR yogurt & cashews    Beverage(s)  water, pepsi when stressed      Exercise   Exercise Type  Light (walking / raking leaves)    How many days per week to you exercise?  7    How many minutes per day do you exercise?  60    Total minutes per week of exercise  420      Patient Education   Previous Diabetes Education  Yes (please comment)   2016 at diagnosis   Nutrition management   Role of diet in the treatment of diabetes and the relationship between the three main macronutrients and blood glucose level    Medications  Reviewed patients medication for diabetes, action, purpose, timing of dose and side effects.   didn't review metformin   Monitoring  Taught/evaluated SMBG meter.    Acute complications  Taught treatment of hypoglycemia - the 15 rule.    Psychosocial adjustment  Role of stress on diabetes      Individualized Goals (developed by patient)   Nutrition  General guidelines for healthy choices and portions discussed    Medications  Other (comment)   look into actions of medications and talk to MD     Outcomes   Expected Outcomes  Demonstrated interest in learning. Expect positive outcomes    Future DMSE  2 months    Program Status  Completed       Individualized Plan for Diabetes Self-Management Training:   Learning Objective:  Patient will have a greater understanding of diabetes self-management. Patient education plan is to attend individual and/or group sessions per assessed needs  and concerns.   Patient Instructions  Free mindful meditation 8 week course: https://palousemindfulness.com/ Aim to eat balanced meals and snacks, can use the snack sheet  handout for ideas. Consider getting more variety in your breakfast choices. Getting up to 30 grams of carbs in your breakfast may help prevent the low blood sugar. edamame noodles are an option for a higher protein, lower carb pasta type food. Aim to get better quality sleep. Review the medications in your book so you can have an informed discussion with your doctor    Expected Outcomes:  Demonstrated interest in learning. Expect positive outcomes  Education material provided: ADA Diabetes: Your Take Control Guide and Snack sheet, sleep hygiene  If problems or questions, patient to contact team via:  Phone  Future DSME appointment: 2 months

## 2018-07-25 NOTE — Assessment & Plan Note (Signed)
Glucose readings much improved compared to last visit, this is largely due to diet. She is dropping prior to lunch daily, this could be from either Glipizide or Lantus.   Start by reducing Lantus to 5 units daily, continue Metformin and Glipizide.   Foot exam today. Pneumonia UTD. Eye exam UTD. Lipids at next visit. Managed on ACE.   Follow up in in late May for re-evaluation.

## 2018-07-25 NOTE — Patient Instructions (Signed)
Free mindful meditation 8 week course: https://palousemindfulness.com/ Aim to eat balanced meals and snacks, can use the snack sheet handout for ideas. Consider getting more variety in your breakfast choices. Getting up to 30 grams of carbs in your breakfast may help prevent the low blood sugar. edamame noodles are an option for a higher protein, lower carb pasta type food. Aim to get better quality sleep. Review the medications in your book so you can have an informed discussion with your doctor

## 2018-07-25 NOTE — Assessment & Plan Note (Signed)
Repeat lipids next visit. Compliant to increased dose of atorvastatin 40 mg.

## 2018-08-02 ENCOUNTER — Encounter: Payer: 59 | Admitting: Family Medicine

## 2018-09-10 ENCOUNTER — Other Ambulatory Visit: Payer: Self-pay | Admitting: Family Medicine

## 2018-09-10 DIAGNOSIS — I152 Hypertension secondary to endocrine disorders: Secondary | ICD-10-CM

## 2018-09-10 DIAGNOSIS — E1159 Type 2 diabetes mellitus with other circulatory complications: Secondary | ICD-10-CM

## 2018-09-10 DIAGNOSIS — E118 Type 2 diabetes mellitus with unspecified complications: Secondary | ICD-10-CM

## 2018-09-10 DIAGNOSIS — I10 Essential (primary) hypertension: Secondary | ICD-10-CM

## 2018-09-10 MED ORDER — LISINOPRIL 10 MG PO TABS: 10.0000 mg | ORAL_TABLET | Freq: Every day | ORAL | 3 refills | Status: DC

## 2018-09-10 MED ORDER — GLIPIZIDE 10 MG PO TABS: 10.0000 mg | ORAL_TABLET | Freq: Two times a day (BID) | ORAL | 0 refills | Status: DC

## 2018-09-10 MED FILL — glipiZIDE 10 MG TABS: 10 | 90 days supply | Qty: 180 | Fill #0

## 2018-09-10 MED FILL — LISINOPRIL 10 MG TABLET: 10 | 90 days supply | Qty: 90 | Fill #0

## 2018-09-14 DIAGNOSIS — E118 Type 2 diabetes mellitus with unspecified complications: Secondary | ICD-10-CM

## 2018-09-17 MED ORDER — METFORMIN HCL 1000 MG PO TABS: 1000.0000 mg | ORAL_TABLET | Freq: Two times a day (BID) | ORAL | 3 refills | Status: DC

## 2018-09-17 MED FILL — metFORMIN HCL 1000 MG TABS: 1000 | 90 days supply | Qty: 180 | Fill #0

## 2018-09-17 NOTE — Telephone Encounter (Signed)
Have not been prescribed. Last office visit on    07/25/2018. Next future appointment on 10/02/2018

## 2018-09-20 ENCOUNTER — Ambulatory Visit: Payer: 59 | Admitting: Registered"

## 2018-09-24 MED FILL — ATORVASTATIN 40 MG TABLET: 40 | 90 days supply | Qty: 90 | Fill #1

## 2018-09-28 ENCOUNTER — Telehealth: Payer: Self-pay | Admitting: Primary Care

## 2018-09-28 DIAGNOSIS — E118 Type 2 diabetes mellitus with unspecified complications: Secondary | ICD-10-CM

## 2018-09-28 DIAGNOSIS — E782 Mixed hyperlipidemia: Secondary | ICD-10-CM

## 2018-09-28 DIAGNOSIS — R7989 Other specified abnormal findings of blood chemistry: Secondary | ICD-10-CM

## 2018-09-28 DIAGNOSIS — E1169 Type 2 diabetes mellitus with other specified complication: Secondary | ICD-10-CM

## 2018-09-28 NOTE — Telephone Encounter (Signed)
Spoken to patient that we can do a finger stick POCT A1C but patient stated that Erin Marquez wanted other labs as well. Patient wants to keep the appointment if possible.

## 2018-09-28 NOTE — Telephone Encounter (Signed)
Pt scheduled doxy me appointment 5/26 @ 2:40 and labs @ 8:45.  She wanted to make sure she got her A1c checked.  Will this be done to have results by the 2:40 appointment

## 2018-09-28 NOTE — Telephone Encounter (Signed)
Noted, orders placed for lab draw.

## 2018-10-02 ENCOUNTER — Ambulatory Visit: Payer: 59 | Admitting: Primary Care

## 2018-10-02 ENCOUNTER — Encounter: Payer: Self-pay | Admitting: Primary Care

## 2018-10-02 ENCOUNTER — Ambulatory Visit (INDEPENDENT_AMBULATORY_CARE_PROVIDER_SITE_OTHER): Payer: 59 | Admitting: Primary Care

## 2018-10-02 ENCOUNTER — Other Ambulatory Visit (INDEPENDENT_AMBULATORY_CARE_PROVIDER_SITE_OTHER): Payer: 59

## 2018-10-02 DIAGNOSIS — E782 Mixed hyperlipidemia: Secondary | ICD-10-CM

## 2018-10-02 DIAGNOSIS — E1169 Type 2 diabetes mellitus with other specified complication: Secondary | ICD-10-CM | POA: Diagnosis not present

## 2018-10-02 DIAGNOSIS — R7989 Other specified abnormal findings of blood chemistry: Secondary | ICD-10-CM

## 2018-10-02 DIAGNOSIS — E118 Type 2 diabetes mellitus with unspecified complications: Secondary | ICD-10-CM

## 2018-10-02 LAB — LIPID PANEL
Cholesterol: 139 mg/dL (ref 0–200)
HDL: 35.1 mg/dL — ABNORMAL LOW (ref >39.00)
LDL Cholesterol: 77 mg/dL (ref 0–99)
NonHDL: 104.29
Total CHOL/HDL Ratio: 4
Triglycerides: 137 mg/dL (ref 0.0–149.0)
VLDL: 27.4 mg/dL (ref 0.0–40.0)

## 2018-10-02 LAB — TSH: TSH: 2.36 u[IU]/mL (ref 0.35–4.50)

## 2018-10-02 LAB — HEMOGLOBIN A1C: Hgb A1c MFr Bld: 7.7 % — ABNORMAL HIGH (ref 4.6–6.5)

## 2018-10-02 NOTE — Assessment & Plan Note (Signed)
Improved on increased dose of atorvastatin 40 mg. Continue same.

## 2018-10-02 NOTE — Assessment & Plan Note (Signed)
Significant improvement in A1C from 14.2 to 7.7!!! Commended her on this success!  Continue off of Lantus as she was experiencing periods of hypoglycemia. Continue Glipizide and Metformin as prescribed. Continue to work on diet and exercise.  Foot and eye exam UTD. Pneumonia vaccination UTD. Managed on statin and ACE.  Follow up in 3 months.

## 2018-10-02 NOTE — Patient Instructions (Signed)
Continue Metformin 1000 mg twice daily and Glipizide 10 mg twice daily.  Continue to work on [employer] and continue with regular exercise.  Schedule a follow up visit for 3 months as discussed.  It was a pleasure to see you today! Allie Bossier, NP-C

## 2018-10-02 NOTE — Progress Notes (Signed)
Subjective:    Patient ID: Erin Marquez, female    DOB: 1970-11-21, 48 y.o.   MRN: [id number]  HPI  Virtual Visit via Video Note  I connected with Erin Marquez on 10/02/18 at  2:40 PM EDT by a video enabled telemedicine application and verified that I am speaking with the correct person using two identifiers.  Location: Patient: Home Provider: Office   I discussed the limitations of evaluation and management by telemedicine and the availability of in person appointments. The patient expressed understanding and agreed to proceed.  History of Present Illness:  Erin Marquez is a 48 year old female who presents today for follow up of diabetes.  Current medications include: Glipizide 10 mg BID, Metformin 1000 mg BID. She stopped insulin several weeks ago due to episodes of hypoglycemia.   She is checking her blood glucose 2-3 times daily and is getting readings of:  AM fasting: 163, 203, 194, 140-150's, 136 2 hours after meals: low 100's to mid 100's  Last A1C: 7.7 in May 2020, 14.2 in February 2020 Last Eye Exam: Due in February 2020 Last Foot Exam:  Due in March 2020 Pneumonia Vaccination: Completed in 2016 ACE/ARB: Lisinopril  Statin: Lipitor   Diet currently consists of:  Breakfast: Eggs, toast, bacon, cereal  Lunch: Left overs, sandwich, raw veggies Dinner: Meat, vegetable, starch Snacks: Occasionally, fruit, cheese, chips and salsa, yogurt and nuts Desserts: Several days weekly Beverages: Small can of soda, water  Exercise: She is walking for 1 hour 5-6 days weekly.     Observations/Objective:  Alert and oriented. Appears well, not sickly. No distress. Speaking in complete sentences.   Assessment and Plan:  See problem based charting.  Follow Up Instructions:  Continue Metformin 1000 mg twice daily and Glipizide 10 mg twice daily.  Continue to work on [employer] and continue with regular exercise.  Schedule a follow up visit for 3 months as discussed.   It was a pleasure to see you today! Allie Bossier, NP-C    I discussed the assessment and treatment plan with the patient. The patient was provided an opportunity to ask questions and all were answered. The patient agreed with the plan and demonstrated an understanding of the instructions.   The patient was advised to call back or seek an in-person evaluation if the symptoms worsen or if the condition fails to improve as anticipated.     Pleas Koch, NP    Review of Systems  Eyes: Negative for visual disturbance.  Respiratory: Negative for shortness of breath.   Cardiovascular: Negative for chest pain.  Neurological: Negative for dizziness and headaches.       Past Medical History:  Diagnosis Date  . Diabetes mellitus without complication ([place])   . Hyperlipidemia   . Hypertension      Social History   Socioeconomic History  . Marital status: Married    Spouse name: Not on file  . Number of children: Not on file  . Years of education: Not on file  . Highest education level: Not on file  Occupational History  . Not on file  Social Needs  . Financial resource strain: Not on file  . Food insecurity:    Worry: Not on file    Inability: Not on file  . Transportation needs:    Medical: Not on file    Non-medical: Not on file  Tobacco Use  . Smoking status: Never Smoker  . Smokeless tobacco: Never Used  Substance  and Sexual Activity  . Alcohol use: Yes    Alcohol/week: 1.0 standard drinks    Types: 1 Standard drinks or equivalent per week  . Drug use: No  . Sexual activity: Yes    Birth control/protection: None  Lifestyle  . Physical activity:    Days per week: Not on file    Minutes per session: Not on file  . Stress: Not on file  Relationships  . Social connections:    Talks on phone: Not on file    Gets together: Not on file    Attends religious service: Not on file    Active member of club or organization: Not on file    Attends meetings of clubs  or organizations: Not on file    Relationship status: Not on file  . Intimate partner violence:    Fear of current or ex partner: Not on file    Emotionally abused: Not on file    Physically abused: Not on file    Forced sexual activity: Not on file  Other Topics Concern  . Not on file  Social History Narrative  . Not on file    Past Surgical History:  Procedure Laterality Date  . WISDOM TOOTH EXTRACTION      Family History  Problem Relation Age of Onset  . Hypertension Mother   . Stroke Mother   . Diabetes Father   . Hyperthyroidism Father     Allergies  Allergen Reactions  . Codeine Nausea Only  . Peanut Oil     Slurred speech    Current Outpatient Medications on File Prior to Visit  Medication Sig Dispense Refill  . acetaminophen (TYLENOL) 650 MG CR tablet Take 1 tablet by mouth 2 (two) times daily.    Marland Kitchen atorvastatin (LIPITOR) 40 MG tablet Take 1 tablet (40 mg total) by mouth daily. For cholesterol. 90 tablet 3  . blood glucose meter kit and supplies KIT Dispense based on patient and insurance preference. Use up to four times daily as directed. (FOR ICD-9 250.00, 250.01). 1 each 0  . Cholecalciferol (VITAMIN D3) 5000 units CAPS Take 1 capsule by mouth daily.    . fexofenadine (ALLEGRA) 180 MG tablet Take 180 mg by mouth daily.    Marland Kitchen glipiZIDE (GLUCOTROL) 10 MG tablet Take 1 tablet (10 mg total) by mouth 2 (two) times daily. For diabetes. 180 tablet 0  . glucose blood (FREESTYLE LITE) test strip Use as instructed to test blood sugar up to 4 times daily 300 each 2  . Insulin Glargine (LANTUS SOLOSTAR) 100 UNIT/ML Solostar Pen Inject 5 Units into the skin at bedtime. 15 mL 5  . Insulin Pen Needle (PEN NEEDLES) 31G X 6 MM MISC Use nightly with insulin. 100 each 5  . lisinopril (ZESTRIL) 10 MG tablet Take 1 tablet (10 mg total) by mouth daily. For blood pressure. 90 tablet 3  . meloxicam (MOBIC) 15 MG tablet Take 15 mg by mouth daily.    . metFORMIN (GLUCOPHAGE) 1000 MG  tablet Take 1 tablet (1,000 mg total) by mouth 2 (two) times daily with a meal. For diabetes. 180 tablet 3  . norethindrone (AYGESTIN) 5 MG tablet Take 5 mg by mouth daily. For 10 days to start cycle    . Omega-3 Fatty Acids (FISH OIL) 1200 MG CAPS 2 cap twice daily    . vitamin B-12 (CYANOCOBALAMIN) 100 MCG tablet Take 1 capsule by mouth daily.     No current facility-administered medications on file prior to  visit.     Wt 187 lb (84.8 kg)   LMP 08/12/2018   BMI 31.12 kg/m    Objective:   Physical Exam  Constitutional: She is oriented to person, place, and time. She appears well-nourished.  Respiratory: Effort normal.  Neurological: She is alert and oriented to person, place, and time.  Psychiatric: She has a normal mood and affect.           Assessment & Plan:

## 2018-10-05 DIAGNOSIS — E119 Type 2 diabetes mellitus without complications: Secondary | ICD-10-CM | POA: Diagnosis not present

## 2018-10-05 DIAGNOSIS — H2513 Age-related nuclear cataract, bilateral: Secondary | ICD-10-CM | POA: Diagnosis not present

## 2018-10-05 DIAGNOSIS — H02402 Unspecified ptosis of left eyelid: Secondary | ICD-10-CM | POA: Diagnosis not present

## 2018-10-05 LAB — HM DIABETES EYE EXAM

## 2018-10-16 ENCOUNTER — Telehealth: Payer: Self-pay | Admitting: Radiology

## 2018-10-16 NOTE — Telephone Encounter (Signed)
Left message to call [id number] to schedule New Gyn/ Annual Exam with Dr Kennon Rounds (last Annual 06/22/17)

## 2018-11-02 NOTE — Progress Notes (Signed)
Last pap 06/23/2017 negative for HPV Mammogram 07/13/2018  Discuss extra skin inside of vagina

## 2018-11-06 ENCOUNTER — Ambulatory Visit (INDEPENDENT_AMBULATORY_CARE_PROVIDER_SITE_OTHER): Payer: 59 | Admitting: Family Medicine

## 2018-11-06 ENCOUNTER — Encounter: Payer: Self-pay | Admitting: Family Medicine

## 2018-11-06 ENCOUNTER — Other Ambulatory Visit: Payer: Self-pay

## 2018-11-06 VITALS — BP 136/81 | HR 72 | Wt 195.0 lb

## 2018-11-06 DIAGNOSIS — Z01419 Encounter for gynecological examination (general) (routine) without abnormal findings: Secondary | ICD-10-CM | POA: Diagnosis not present

## 2018-11-06 DIAGNOSIS — N898 Other specified noninflammatory disorders of vagina: Secondary | ICD-10-CM

## 2018-11-06 DIAGNOSIS — H6502 Acute serous otitis media, left ear: Secondary | ICD-10-CM

## 2018-11-06 DIAGNOSIS — E282 Polycystic ovarian syndrome: Secondary | ICD-10-CM

## 2018-11-06 DIAGNOSIS — N899 Noninflammatory disorder of vagina, unspecified: Secondary | ICD-10-CM | POA: Insufficient documentation

## 2018-11-06 MED ORDER — AMOXICILLIN 500 MG PO CAPS: 500.0000 mg | ORAL_CAPSULE | Freq: Three times a day (TID) | ORAL | 0 refills | Status: DC

## 2018-11-06 MED FILL — AMOXICILLIN 500 MG CAPSULE: 500 | 7 days supply | Qty: 21 | Fill #0

## 2018-11-06 NOTE — Assessment & Plan Note (Addendum)
Will book for vaginoplasty/removal of this remnant. Risks include but are not limited to bleeding, infection, injury to surrounding structures, including bowel, bladder and ureters, blood clots, and death.  Likelihood of success is moderate. Chance that pain would not be resolved.

## 2018-11-06 NOTE — Progress Notes (Signed)
  Subjective:     Erin Marquez is a 48 y.o. female and is here for a comprehensive physical exam. The patient reports problems - painful intercourse. H/o anterior repair and pain is with intercourse posteriorly. Feels like something is being pulled or pinched and is quite uncomfortable. Also notes pain that is sharp in her left ear. Long h/o middle ear infections  The following portions of the patient's history were reviewed and updated as appropriate: allergies, current medications, past family history, past medical history, past social history, past surgical history and problem list.  Review of Systems Pertinent items noted in HPI and remainder of comprehensive ROS otherwise negative.   Objective:    BP 136/81   Pulse 72   Wt 195 lb (88.5 kg)   LMP 10/26/2018   BMI 32.45 kg/m  General appearance: alert, cooperative and appears stated age Head: Normocephalic, without obvious abnormality, atraumatic Ears: left TM is dull and full Neck: no adenopathy, supple, symmetrical, trachea midline and thyroid not enlarged, symmetric, no tenderness/mass/nodules Lungs: clear to auscultation bilaterally Breasts: normal appearance, no masses or tenderness Heart: regular rate and rhythm, S1, S2 normal, no murmur, click, rub or gallop Abdomen: soft, non-tender; bowel sounds normal; no masses,  no organomegaly Pelvic: cervix normal in appearance, external genitalia normal, no adnexal masses or tenderness, no cervical motion tenderness, uterus normal size, shape, and consistency and vagina with some redundant tissue and hymenal ring which is markedly tender and likely contributing to her painful intercourse Extremities: Homans sign is negative, no sign of DVT Pulses: 2+ and symmetric Skin: Skin color, texture, turgor normal. No rashes or lesions Lymph nodes: Cervical, supraclavicular, and axillary nodes normal. Neurologic: Grossly normal    Assessment:    Healthy female exam.      Plan:       Problem List Items Addressed This Visit      Unprioritized   PCOS (polycystic ovarian syndrome) - Primary (Chronic)   Redundant hymenal ring tissue    Will book for vaginoplasty/removal of this remnant. Risks include but are not limited to bleeding, infection, injury to surrounding structures, including bowel, bladder and ureters, blood clots, and death.  Likelihood of success is moderate. Chance that pain would not be resolved.        Other Visit Diagnoses    Encounter for gynecological examination without abnormal finding       Acute serous otitis media of left ear, recurrence not specified       Abx sent in   Relevant Medications   amoxicillin (AMOXIL) 500 MG capsule      See After Visit Summary for Counseling Recommendations

## 2018-11-06 NOTE — Patient Instructions (Signed)
 Preventive Care 21-46 Years Old, Female Preventive care refers to visits with your health care provider and lifestyle choices that can promote health and wellness. This includes:  A yearly physical exam. This may also be called an annual well check.  Regular dental visits and eye exams.  Immunizations.  Screening for certain conditions.  Healthy lifestyle choices, such as eating a healthy diet, getting regular exercise, not using drugs or products that contain nicotine and tobacco, and limiting alcohol use. What can I expect for my preventive care visit? Physical exam Your health care provider will check your:  Height and weight. This may be used to calculate body mass index (BMI), which tells if you are at a healthy weight.  Heart rate and blood pressure.  Skin for abnormal spots. Counseling Your health care provider may ask you questions about your:  Alcohol, tobacco, and drug use.  Emotional well-being.  Home and relationship well-being.  Sexual activity.  Eating habits.  Work and work environment.  Method of birth control.  Menstrual cycle.  Pregnancy history. What immunizations do I need?  Influenza (flu) vaccine  This is recommended every year. Tetanus, diphtheria, and pertussis (Tdap) vaccine  You may need a Td booster every 10 years. Varicella (chickenpox) vaccine  You may need this if you have not been vaccinated. Human papillomavirus (HPV) vaccine  If recommended by your health care provider, you may need three doses over 6 months. Measles, mumps, and rubella (MMR) vaccine  You may need at least one dose of MMR. You may also need a second dose. Meningococcal conjugate (MenACWY) vaccine  One dose is recommended if you are age 19-21 years and a first-year college student living in a residence hall, or if you have one of several medical conditions. You may also need additional booster doses. Pneumococcal conjugate (PCV13) vaccine  You may need  this if you have certain conditions and were not previously vaccinated. Pneumococcal polysaccharide (PPSV23) vaccine  You may need one or two doses if you smoke cigarettes or if you have certain conditions. Hepatitis A vaccine  You may need this if you have certain conditions or if you travel or work in places where you may be exposed to hepatitis A. Hepatitis B vaccine  You may need this if you have certain conditions or if you travel or work in places where you may be exposed to hepatitis B. Haemophilus influenzae type b (Hib) vaccine  You may need this if you have certain conditions. You may receive vaccines as individual doses or as more than one vaccine together in one shot (combination vaccines). Talk with your health care provider about the risks and benefits of combination vaccines. What tests do I need?  Blood tests  Lipid and cholesterol levels. These may be checked every 5 years starting at age 20.  Hepatitis C test.  Hepatitis B test. Screening  Diabetes screening. This is done by checking your blood sugar (glucose) after you have not eaten for a while (fasting).  Sexually transmitted disease (STD) testing.  BRCA-related cancer screening. This may be done if you have a family history of breast, ovarian, tubal, or peritoneal cancers.  Pelvic exam and Pap test. This may be done every 3 years starting at age 21. Starting at age 30, this may be done every 5 years if you have a Pap test in combination with an HPV test. Talk with your health care provider about your test results, treatment options, and if necessary, the need for more   tests. Follow these instructions at home: Eating and drinking   Eat a diet that includes fresh fruits and vegetables, whole grains, lean protein, and low-fat dairy.  Take vitamin and mineral supplements as recommended by your health care provider.  Do not drink alcohol if: ? Your health care provider tells you not to drink. ? You are  pregnant, may be pregnant, or are planning to become pregnant.  If you drink alcohol: ? Limit how much you have to 0-1 drink a day. ? Be aware of how much alcohol is in your drink. In the [place] one drink equals one 12 oz bottle of beer (355 mL), one 5 oz glass of wine (148 mL), or one 1 oz glass of hard liquor (44 mL). Lifestyle  Take daily care of your teeth and gums.  Stay active. Exercise for at least 30 minutes on 5 or more days each week.  Do not use any products that contain nicotine or tobacco, such as cigarettes, e-cigarettes, and chewing tobacco. If you need help quitting, ask your health care provider.  If you are sexually active, practice safe sex. Use a condom or other form of birth control (contraception) in order to prevent pregnancy and STIs (sexually transmitted infections). If you plan to become pregnant, see your health care provider for a preconception visit. What's next?  Visit your health care provider once a year for a well check visit.  Ask your health care provider how often you should have your eyes and teeth checked.  Stay up to date on all vaccines. This information is not intended to replace advice given to you by your health care provider. Make sure you discuss any questions you have with your health care provider. Document Released: 06/21/2001 Document Revised: 01/04/2018 Document Reviewed: 01/04/2018 Elsevier Patient Education  2020 [employer].

## 2018-11-13 NOTE — H&P (Signed)
  Erin Marquez is an 48 y.o. [id number] female.   Chief Complaint: vaginal irritation HPI: Patient with remnant hymenal ring, which is inflamed and irritated and is painful during intercourse. She desires permanent treatment.  Past Medical History:  Diagnosis Date  . Diabetes mellitus without complication ([place])   . Hyperlipidemia   . Hypertension     Past Surgical History:  Procedure Laterality Date  . WISDOM TOOTH EXTRACTION      Family History  Problem Relation Age of Onset  . Hypertension Mother   . Stroke Mother   . Diabetes Father   . Hyperthyroidism Father   . Prostate cancer Father    Social History:  reports that she has never smoked. She has never used smokeless tobacco. She reports current alcohol use of about 1.0 standard drinks of alcohol per week. She reports that she does not use drugs.  Allergies:  Allergies  Allergen Reactions  . Codeine Nausea Only  . Peanut Oil     Slurred speech    No medications prior to admission.    Pertinent items are noted in HPI.  Last menstrual period 10/26/2018. General appearance: alert, cooperative and appears stated age Head: Normocephalic, without obvious abnormality, atraumatic Neck: supple, symmetrical, trachea midline Lungs: normal effort Heart: regular rate and rhythm Abdomen: soft, non-tender; bowel sounds normal; no masses,  no organomegaly Extremities: extremities normal, atraumatic, no cyanosis or edema Skin: Skin color, texture, turgor normal. No rashes or lesions Neurologic: Grossly normal   Lab Results  Component Value Date   WBC 10.3 06/29/2018   HGB 14.5 06/29/2018   HCT 44.9 06/29/2018   MCV 90.5 06/29/2018   PLT 238 06/29/2018   No results found for: PREGTESTUR, PREGSERUM, HCG, HCGQUANT   Assessment/Plan Principal Problem:   Redundant hymenal ring tissue  For vaginoplasty with excision of inflamed tender tissue. Risks include but are not limited to bleeding, infection, injury to surrounding  structures, including bowel, bladder and ureters, blood clots, and death.  Likelihood of success is moderate.    Erin Marquez 11/13/2018, 9:49 AM

## 2018-11-15 ENCOUNTER — Encounter: Payer: Self-pay | Admitting: Primary Care

## 2018-11-15 ENCOUNTER — Other Ambulatory Visit: Payer: Self-pay

## 2018-11-15 ENCOUNTER — Ambulatory Visit (INDEPENDENT_AMBULATORY_CARE_PROVIDER_SITE_OTHER): Payer: 59 | Admitting: Primary Care

## 2018-11-15 VITALS — BP 120/78 | HR 77 | Temp 97.8°F | Ht 65.0 in | Wt 194.0 lb

## 2018-11-15 DIAGNOSIS — H9203 Otalgia, bilateral: Secondary | ICD-10-CM | POA: Diagnosis not present

## 2018-11-15 DIAGNOSIS — H9209 Otalgia, unspecified ear: Secondary | ICD-10-CM | POA: Insufficient documentation

## 2018-11-15 DIAGNOSIS — J3089 Other allergic rhinitis: Secondary | ICD-10-CM

## 2018-11-15 MED ORDER — PREDNISONE 10 MG PO TABS: ORAL_TABLET | ORAL | 0 refills | Status: DC

## 2018-11-15 MED FILL — predniSONE 10 MG TABS: 10 | 6 days supply | Qty: 12 | Fill #0

## 2018-11-15 NOTE — Assessment & Plan Note (Signed)
No evidence of infection on today's exam. Suspect allergy involvement given appearance of TM's and that she had no improvement with oral antibiotics.   Already taking two antihistamine medications. Rx for low dose prednisone course sent to pharmacy for likely inflammation/fluid collection behind TM's. Discussed to avoid Ibuprofen, Tylenol okay.  She will update. Flonase once prednisone has been completed.

## 2018-11-15 NOTE — Progress Notes (Signed)
Subjective:    Patient ID: Erin Marquez, female    DOB: 05-Jun-1970, 48 y.o.   MRN: [id number]  HPI  Ms. Spinola is a 48 year old female with a history of hypertension, type 2 diabetes, PCOS, hyperlipidemia, allergic rhinitis, seasonal allergies, middle ear infections who presents today with a chief complaint of otalgia.  She was evaluated and treated by her OB/GYN on 11/06/18 for symptoms of acute left sharp ear pain. Exam with "dull and full TM's". She was treated with amoxicillin 500 mg capsules for which she took three times daily for seven days.  Since treatment she hasn't noticed any improvement in her pain. She completed the course of antibiotics. She does have post nasal drip, denies sore throat, cough, fevers. Her pain is sharp/shooting. She's been taking Benadryl, Tylenol, Ibuprofen without improvement in pain. She is compliant to her Allegra daily.  Review of Systems  Constitutional: Negative for fever.  HENT: Positive for ear pain and postnasal drip. Negative for congestion and sore throat.   Allergic/Immunologic: Positive for environmental allergies.       Past Medical History:  Diagnosis Date  . Diabetes mellitus without complication ([place])   . Hyperlipidemia   . Hypertension      Social History   Socioeconomic History  . Marital status: Married    Spouse name: Not on file  . Number of children: Not on file  . Years of education: Not on file  . Highest education level: Not on file  Occupational History  . Not on file  Social Needs  . Financial resource strain: Not on file  . Food insecurity    Worry: Not on file    Inability: Not on file  . Transportation needs    Medical: Not on file    Non-medical: Not on file  Tobacco Use  . Smoking status: Never Smoker  . Smokeless tobacco: Never Used  Substance and Sexual Activity  . Alcohol use: Yes    Alcohol/week: 1.0 standard drinks    Types: 1 Standard drinks or equivalent per week  . Drug use: No  . Sexual  activity: Yes    Birth control/protection: None, Surgical    Comment: vasectomy  Lifestyle  . Physical activity    Days per week: Not on file    Minutes per session: Not on file  . Stress: Not on file  Relationships  . Social Herbalist on phone: Not on file    Gets together: Not on file    Attends religious service: Not on file    Active member of club or organization: Not on file    Attends meetings of clubs or organizations: Not on file    Relationship status: Not on file  . Intimate partner violence    Fear of current or ex partner: Not on file    Emotionally abused: Not on file    Physically abused: Not on file    Forced sexual activity: Not on file  Other Topics Concern  . Not on file  Social History Narrative  . Not on file    Past Surgical History:  Procedure Laterality Date  . WISDOM TOOTH EXTRACTION      Family History  Problem Relation Age of Onset  . Hypertension Mother   . Stroke Mother   . Diabetes Father   . Hyperthyroidism Father   . Prostate cancer Father     Allergies  Allergen Reactions  . Codeine Nausea Only  .  Peanut Oil     Slurred speech    Current Outpatient Medications on File Prior to Visit  Medication Sig Dispense Refill  . acetaminophen (TYLENOL) 650 MG CR tablet Take 1 tablet by mouth 2 (two) times daily.    Marland Kitchen atorvastatin (LIPITOR) 40 MG tablet Take 1 tablet (40 mg total) by mouth daily. For cholesterol. 90 tablet 3  . blood glucose meter kit and supplies KIT Dispense based on patient and insurance preference. Use up to four times daily as directed. (FOR ICD-9 250.00, 250.01). 1 each 0  . Blood Glucose Monitoring Suppl (FREESTYLE LITE) DEVI     . Cholecalciferol (VITAMIN D3) 5000 units CAPS Take 1 capsule by mouth daily.    . fexofenadine (ALLEGRA) 180 MG tablet Take 180 mg by mouth daily.    Marland Kitchen glipiZIDE (GLUCOTROL) 10 MG tablet Take 1 tablet (10 mg total) by mouth 2 (two) times daily. For diabetes. 180 tablet 0  .  glucose blood (FREESTYLE LITE) test strip Use as instructed to test blood sugar up to 4 times daily 300 each 2  . Lancets (FREESTYLE) lancets     . lisinopril (ZESTRIL) 10 MG tablet Take 1 tablet (10 mg total) by mouth daily. For blood pressure. 90 tablet 3  . meloxicam (MOBIC) 15 MG tablet Take 15 mg by mouth daily.    . metFORMIN (GLUCOPHAGE) 1000 MG tablet Take 1 tablet (1,000 mg total) by mouth 2 (two) times daily with a meal. For diabetes. 180 tablet 3  . Omega-3 Fatty Acids (FISH OIL) 1200 MG CAPS 2 cap twice daily    . vitamin B-12 (CYANOCOBALAMIN) 100 MCG tablet Take 1 capsule by mouth daily.     No current facility-administered medications on file prior to visit.     BP 120/78   Pulse 77   Temp 97.8 F (36.6 C) (Temporal)   Ht _0  (1.651 m)   Wt 194 lb (88 kg)   LMP 10/26/2018   SpO2 98%   BMI 32.28 kg/m    Objective:   Physical Exam  Constitutional: She appears well-nourished.  HENT:  Right Ear: Ear canal normal. Tympanic membrane is not erythematous and not bulging.  Left Ear: Ear canal normal. Tympanic membrane is not erythematous and not bulging.  Dullness to bilateral TM's. No erythema, bulging, drainage.  Respiratory: Effort normal.           Assessment & Plan:

## 2018-11-15 NOTE — Assessment & Plan Note (Signed)
Suspect otalgia is secondary to allergies. Treat with low dose prednisone course. Discussed to watch glucose readings.

## 2018-11-15 NOTE — Patient Instructions (Signed)
Start prednisone tablets. Take three tablets for 2 days, then two tablets for 2 days, then one tablet for 2 days.  Avoid Ibuprofen while taking prednisone, Tylenol is okay.  It was a pleasure to see you today!

## 2018-11-28 ENCOUNTER — Other Ambulatory Visit: Payer: Self-pay

## 2018-11-28 ENCOUNTER — Encounter (HOSPITAL_BASED_OUTPATIENT_CLINIC_OR_DEPARTMENT_OTHER): Payer: Self-pay | Admitting: *Deleted

## 2018-11-28 NOTE — Progress Notes (Signed)
Pt is coming Monday for BMET,CBC and to pick up G2 drink. Dr. Marcell Barlow reviewed EKG from 06/2018 - ok for surgery.  pt is going Saturday 7/25 for Covid test at [facility].

## 2018-12-01 ENCOUNTER — Other Ambulatory Visit (HOSPITAL_COMMUNITY)
Admission: RE | Admit: 2018-12-01 | Discharge: 2018-12-01 | Disposition: A | Discharge status: Home / Self Care | Payer: 59 | Source: Ambulatory Visit | Attending: Family Medicine | Admitting: Family Medicine

## 2018-12-01 DIAGNOSIS — Z1159 Encounter for screening for other viral diseases: Secondary | ICD-10-CM | POA: Diagnosis not present

## 2018-12-01 LAB — SARS CORONAVIRUS 2 (TAT 6-24 HRS): SARS Coronavirus 2: NEGATIVE

## 2018-12-03 ENCOUNTER — Encounter (HOSPITAL_BASED_OUTPATIENT_CLINIC_OR_DEPARTMENT_OTHER)
Admission: RE | Admit: 2018-12-03 | Discharge: 2018-12-03 | Disposition: A | Discharge status: Home / Self Care | Payer: 59 | Source: Ambulatory Visit | Attending: Family Medicine | Admitting: Family Medicine

## 2018-12-03 ENCOUNTER — Other Ambulatory Visit: Payer: Self-pay

## 2018-12-03 DIAGNOSIS — Z833 Family history of diabetes mellitus: Secondary | ICD-10-CM | POA: Diagnosis not present

## 2018-12-03 DIAGNOSIS — Z823 Family history of stroke: Secondary | ICD-10-CM | POA: Diagnosis not present

## 2018-12-03 DIAGNOSIS — E669 Obesity, unspecified: Secondary | ICD-10-CM | POA: Diagnosis not present

## 2018-12-03 DIAGNOSIS — Z91010 Allergy to peanuts: Secondary | ICD-10-CM | POA: Diagnosis not present

## 2018-12-03 DIAGNOSIS — E785 Hyperlipidemia, unspecified: Secondary | ICD-10-CM | POA: Diagnosis not present

## 2018-12-03 DIAGNOSIS — Z8042 Family history of malignant neoplasm of prostate: Secondary | ICD-10-CM | POA: Diagnosis not present

## 2018-12-03 DIAGNOSIS — Z885 Allergy status to narcotic agent status: Secondary | ICD-10-CM | POA: Diagnosis not present

## 2018-12-03 DIAGNOSIS — I10 Essential (primary) hypertension: Secondary | ICD-10-CM | POA: Diagnosis not present

## 2018-12-03 DIAGNOSIS — Z01812 Encounter for preprocedural laboratory examination: Secondary | ICD-10-CM | POA: Insufficient documentation

## 2018-12-03 DIAGNOSIS — Z6832 Body mass index (BMI) 32.0-32.9, adult: Secondary | ICD-10-CM | POA: Diagnosis not present

## 2018-12-03 DIAGNOSIS — E119 Type 2 diabetes mellitus without complications: Secondary | ICD-10-CM | POA: Diagnosis not present

## 2018-12-03 DIAGNOSIS — M5430 Sciatica, unspecified side: Secondary | ICD-10-CM | POA: Diagnosis not present

## 2018-12-03 DIAGNOSIS — F329 Major depressive disorder, single episode, unspecified: Secondary | ICD-10-CM | POA: Diagnosis not present

## 2018-12-03 DIAGNOSIS — N898 Other specified noninflammatory disorders of vagina: Secondary | ICD-10-CM | POA: Diagnosis not present

## 2018-12-03 DIAGNOSIS — Z8249 Family history of ischemic heart disease and other diseases of the circulatory system: Secondary | ICD-10-CM | POA: Diagnosis not present

## 2018-12-03 DIAGNOSIS — Z8349 Family history of other endocrine, nutritional and metabolic diseases: Secondary | ICD-10-CM | POA: Diagnosis not present

## 2018-12-03 LAB — BASIC METABOLIC PANEL
Anion gap: 12 (ref 5–15)
BUN: 13 mg/dL (ref 6–20)
CO2: 24 mmol/L (ref 22–32)
Calcium: 9.8 mg/dL (ref 8.9–10.3)
Chloride: 102 mmol/L (ref 98–111)
Creatinine, Ser: 0.88 mg/dL (ref 0.44–1.00)
GFR calc Af Amer: >60 mL/min (ref >60)
GFR calc non Af Amer: >60 mL/min (ref >60)
Glucose, Bld: 143 mg/dL — ABNORMAL HIGH (ref 70–99)
Potassium: 4.5 mmol/L (ref 3.5–5.1)
Sodium: 138 mmol/L (ref 135–145)

## 2018-12-03 LAB — CBC
HCT: 39.2 % (ref 36.0–46.0)
Hemoglobin: 13.4 g/dL (ref 12.0–15.0)
MCH: 31.5 pg (ref 26.0–34.0)
MCHC: 34.2 g/dL (ref 30.0–36.0)
MCV: 92 fL (ref 80.0–100.0)
Platelets: 233 10*3/uL (ref 150–400)
RBC: 4.26 MIL/uL (ref 3.87–5.11)
RDW: 12 % (ref 11.5–15.5)
WBC: 9.4 10*3/uL (ref 4.0–10.5)
nRBC: 0 % (ref 0.0–0.2)

## 2018-12-03 LAB — POCT PREGNANCY, URINE: Preg Test, Ur: NEGATIVE

## 2018-12-03 NOTE — Progress Notes (Signed)
G2 pre surgical drink given to pt with instructions to finish by 0600 DOS. Pt verbalized understanding

## 2018-12-04 NOTE — Anesthesia Preprocedure Evaluation (Addendum)
Anesthesia Evaluation  Patient identified by MRN, date of birth, ID band Patient awake    Reviewed: Allergy & Precautions, NPO status , Patient's Chart, lab work & pertinent test results  History of Anesthesia Complications Negative for: history of anesthetic complications  Airway Mallampati: II  TM Distance: >3 FB Neck ROM: Full    Dental  (+) Dental Advisory Given   Pulmonary neg pulmonary ROS,    breath sounds clear to auscultation       Cardiovascular hypertension, Pt. on medications  Rhythm:Regular Rate:Normal     Neuro/Psych PSYCHIATRIC DISORDERS Depression  Sciatica     GI/Hepatic negative GI ROS, Neg liver ROS,   Endo/Other  diabetes, Type 2, Oral Hypoglycemic Agents Obesity   Renal/GU negative Renal ROS     Musculoskeletal negative musculoskeletal ROS (+)   Abdominal   Peds  Hematology negative hematology ROS (+)   Anesthesia Other Findings   Reproductive/Obstetrics  PCOS                            Anesthesia Physical Anesthesia Plan  ASA: II  Anesthesia Plan: General   Post-op Pain Management:    Induction: Intravenous  PONV Risk Score and Plan: 3 and Treatment may vary due to age or medical condition, Ondansetron, Dexamethasone and Midazolam  Airway Management Planned: LMA  Additional Equipment: None  Intra-op Plan:   Post-operative Plan: Extubation in OR  Informed Consent: I have reviewed the patients History and Physical, chart, labs and discussed the procedure including the risks, benefits and alternatives for the proposed anesthesia with the patient or authorized representative who has indicated his/her understanding and acceptance.     Dental advisory given  Plan Discussed with: CRNA and Anesthesiologist  Anesthesia Plan Comments:        Anesthesia Quick Evaluation

## 2018-12-05 ENCOUNTER — Encounter (HOSPITAL_BASED_OUTPATIENT_CLINIC_OR_DEPARTMENT_OTHER)
Admission: RE | Disposition: A | Discharge status: Home / Self Care | Payer: Self-pay | Source: Home / Self Care | Attending: Family Medicine

## 2018-12-05 ENCOUNTER — Other Ambulatory Visit: Payer: Self-pay

## 2018-12-05 ENCOUNTER — Ambulatory Visit (HOSPITAL_BASED_OUTPATIENT_CLINIC_OR_DEPARTMENT_OTHER)
Admission: RE | Admit: 2018-12-05 | Discharge: 2018-12-05 | Disposition: A | Discharge status: Home / Self Care | Payer: 59 | Attending: Family Medicine | Admitting: Family Medicine

## 2018-12-05 ENCOUNTER — Encounter (HOSPITAL_BASED_OUTPATIENT_CLINIC_OR_DEPARTMENT_OTHER): Payer: Self-pay

## 2018-12-05 ENCOUNTER — Ambulatory Visit (HOSPITAL_BASED_OUTPATIENT_CLINIC_OR_DEPARTMENT_OTHER): Payer: 59 | Admitting: Anesthesiology

## 2018-12-05 DIAGNOSIS — Z7984 Long term (current) use of oral hypoglycemic drugs: Secondary | ICD-10-CM | POA: Diagnosis not present

## 2018-12-05 DIAGNOSIS — M5430 Sciatica, unspecified side: Secondary | ICD-10-CM | POA: Insufficient documentation

## 2018-12-05 DIAGNOSIS — N898 Other specified noninflammatory disorders of vagina: Secondary | ICD-10-CM | POA: Insufficient documentation

## 2018-12-05 DIAGNOSIS — Z6832 Body mass index (BMI) 32.0-32.9, adult: Secondary | ICD-10-CM | POA: Diagnosis not present

## 2018-12-05 DIAGNOSIS — E119 Type 2 diabetes mellitus without complications: Secondary | ICD-10-CM | POA: Diagnosis not present

## 2018-12-05 DIAGNOSIS — E785 Hyperlipidemia, unspecified: Secondary | ICD-10-CM | POA: Insufficient documentation

## 2018-12-05 DIAGNOSIS — N9069 Other specified hypertrophy of vulva: Secondary | ICD-10-CM | POA: Diagnosis not present

## 2018-12-05 DIAGNOSIS — Z8249 Family history of ischemic heart disease and other diseases of the circulatory system: Secondary | ICD-10-CM | POA: Diagnosis not present

## 2018-12-05 DIAGNOSIS — N899 Noninflammatory disorder of vagina, unspecified: Secondary | ICD-10-CM

## 2018-12-05 DIAGNOSIS — I10 Essential (primary) hypertension: Secondary | ICD-10-CM | POA: Insufficient documentation

## 2018-12-05 DIAGNOSIS — Z833 Family history of diabetes mellitus: Secondary | ICD-10-CM | POA: Insufficient documentation

## 2018-12-05 DIAGNOSIS — Z8042 Family history of malignant neoplasm of prostate: Secondary | ICD-10-CM | POA: Insufficient documentation

## 2018-12-05 DIAGNOSIS — Z885 Allergy status to narcotic agent status: Secondary | ICD-10-CM | POA: Insufficient documentation

## 2018-12-05 DIAGNOSIS — Z91010 Allergy to peanuts: Secondary | ICD-10-CM | POA: Insufficient documentation

## 2018-12-05 DIAGNOSIS — F329 Major depressive disorder, single episode, unspecified: Secondary | ICD-10-CM | POA: Insufficient documentation

## 2018-12-05 DIAGNOSIS — E669 Obesity, unspecified: Secondary | ICD-10-CM | POA: Insufficient documentation

## 2018-12-05 DIAGNOSIS — Z8349 Family history of other endocrine, nutritional and metabolic diseases: Secondary | ICD-10-CM | POA: Insufficient documentation

## 2018-12-05 DIAGNOSIS — Z823 Family history of stroke: Secondary | ICD-10-CM | POA: Insufficient documentation

## 2018-12-05 HISTORY — PX: HYMENECTOMY: SHX5853

## 2018-12-05 LAB — GLUCOSE, CAPILLARY
Glucose-Capillary: 138 mg/dL — ABNORMAL HIGH (ref 70–99)
Glucose-Capillary: 125 mg/dL — ABNORMAL HIGH (ref 70–99)

## 2018-12-05 SURGERY — HYMENECTOMY
Anesthesia: General

## 2018-12-05 MED ORDER — MIDAZOLAM HCL 2 MG/2ML IJ SOLN
INTRAMUSCULAR | Status: AC
  Filled 2018-12-05: qty 2

## 2018-12-05 MED ORDER — GABAPENTIN 100 MG PO CAPS
100.0000 mg | ORAL_CAPSULE | ORAL | Status: AC
  Administered 2018-12-05: 100 mg via ORAL

## 2018-12-05 MED ORDER — PROMETHAZINE HCL 25 MG/ML IJ SOLN: 6.2500 mg | INTRAMUSCULAR | Status: DC | PRN

## 2018-12-05 MED ORDER — EPHEDRINE 5 MG/ML INJ
INTRAVENOUS | Status: AC
  Filled 2018-12-05: qty 20

## 2018-12-05 MED ORDER — FENTANYL CITRATE (PF) 100 MCG/2ML IJ SOLN
50.0000 ug | INTRAMUSCULAR | Status: DC | PRN
  Administered 2018-12-05: 50 ug via INTRAVENOUS

## 2018-12-05 MED ORDER — CELECOXIB 100 MG PO CAPS
100.0000 mg | ORAL_CAPSULE | ORAL | Status: AC
  Administered 2018-12-05: 100 mg via ORAL

## 2018-12-05 MED ORDER — ONDANSETRON HCL 4 MG/2ML IJ SOLN
INTRAMUSCULAR | Status: AC
  Filled 2018-12-05: qty 2

## 2018-12-05 MED ORDER — FENTANYL CITRATE (PF) 100 MCG/2ML IJ SOLN: 25.0000 ug | INTRAMUSCULAR | Status: DC | PRN

## 2018-12-05 MED ORDER — LACTATED RINGERS IV SOLN
INTRAVENOUS | Status: DC
  Administered 2018-12-05: 09:00:00 via INTRAVENOUS

## 2018-12-05 MED ORDER — OXYCODONE HCL 5 MG PO TABS: 5.0000 mg | ORAL_TABLET | Freq: Once | ORAL | Status: DC | PRN

## 2018-12-05 MED ORDER — BUPIVACAINE HCL 0.25 % IJ SOLN
INTRAMUSCULAR | Status: DC | PRN
  Administered 2018-12-05: 7 mL

## 2018-12-05 MED ORDER — ACETAMINOPHEN 500 MG PO TABS
1000.0000 mg | ORAL_TABLET | ORAL | Status: AC
  Administered 2018-12-05: 1000 mg via ORAL

## 2018-12-05 MED ORDER — GABAPENTIN 100 MG PO CAPS
ORAL_CAPSULE | ORAL | Status: AC
  Filled 2018-12-05: qty 1

## 2018-12-05 MED ORDER — OXYCODONE HCL 5 MG/5ML PO SOLN: 5.0000 mg | Freq: Once | ORAL | Status: DC | PRN

## 2018-12-05 MED ORDER — DEXAMETHASONE SODIUM PHOSPHATE 4 MG/ML IJ SOLN
INTRAMUSCULAR | Status: DC | PRN
  Administered 2018-12-05: 5 mg via INTRAVENOUS

## 2018-12-05 MED ORDER — PHENYLEPHRINE 40 MCG/ML (10ML) SYRINGE FOR IV PUSH (FOR BLOOD PRESSURE SUPPORT)
PREFILLED_SYRINGE | INTRAVENOUS | Status: AC
  Filled 2018-12-05: qty 10

## 2018-12-05 MED ORDER — DEXAMETHASONE SODIUM PHOSPHATE 10 MG/ML IJ SOLN
INTRAMUSCULAR | Status: AC
  Filled 2018-12-05: qty 1

## 2018-12-05 MED ORDER — SUCCINYLCHOLINE CHLORIDE 200 MG/10ML IV SOSY
PREFILLED_SYRINGE | INTRAVENOUS | Status: AC
  Filled 2018-12-05: qty 10

## 2018-12-05 MED ORDER — FENTANYL CITRATE (PF) 100 MCG/2ML IJ SOLN
INTRAMUSCULAR | Status: AC
  Filled 2018-12-05: qty 2

## 2018-12-05 MED ORDER — LIDOCAINE 2% (20 MG/ML) 5 ML SYRINGE
INTRAMUSCULAR | Status: AC
  Filled 2018-12-05: qty 5

## 2018-12-05 MED ORDER — ONDANSETRON HCL 4 MG/2ML IJ SOLN
INTRAMUSCULAR | Status: DC | PRN
  Administered 2018-12-05: 4 mg via INTRAVENOUS

## 2018-12-05 MED ORDER — LIDOCAINE HCL (CARDIAC) PF 100 MG/5ML IV SOSY
PREFILLED_SYRINGE | INTRAVENOUS | Status: DC | PRN
  Administered 2018-12-05: 100 mg via INTRAVENOUS

## 2018-12-05 MED ORDER — MIDAZOLAM HCL 2 MG/2ML IJ SOLN
1.0000 mg | INTRAMUSCULAR | Status: DC | PRN
  Administered 2018-12-05: 09:00:00 1 mg via INTRAVENOUS

## 2018-12-05 MED ORDER — CELECOXIB 100 MG PO CAPS
ORAL_CAPSULE | ORAL | Status: AC
  Filled 2018-12-05: qty 1

## 2018-12-05 MED ORDER — SCOPOLAMINE 1 MG/3DAYS TD PT72: 1.0000 | MEDICATED_PATCH | Freq: Once | TRANSDERMAL | Status: DC

## 2018-12-05 MED ORDER — ACETAMINOPHEN 500 MG PO TABS
ORAL_TABLET | ORAL | Status: AC
  Filled 2018-12-05: qty 2

## 2018-12-05 SURGICAL SUPPLY — 15 items
BLADE 15 SAFETY STRL DISP (BLADE) ×2 IMPLANT
GLOVE BIOGEL PI IND STRL 7.0 (GLOVE) ×2 IMPLANT
GLOVE BIOGEL PI INDICATOR 7.0 (GLOVE) ×2
GLOVE ECLIPSE 7.0 STRL STRAW (GLOVE) ×2 IMPLANT
GLOVE NEODERM STER SZ 7 (GLOVE) ×2 IMPLANT
GOWN STRL REUS W/TWL LRG LVL3 (GOWN DISPOSABLE) ×4 IMPLANT
NS IRRIG 1000ML POUR BTL (IV SOLUTION) ×2 IMPLANT
PACK VAGINAL MINOR WOMEN LF (CUSTOM PROCEDURE TRAY) ×2 IMPLANT
PAD OB MATERNITY 4.3X12.25 (PERSONAL CARE ITEMS) ×2 IMPLANT
PAD PREP 24X48 CUFFED NSTRL (MISCELLANEOUS) ×2 IMPLANT
SUT VIC AB 2-0 CT1 27 (SUTURE) ×1
SUT VIC AB 2-0 CT1 TAPERPNT 27 (SUTURE) ×1 IMPLANT
SUT VIC AB 3-0 SH 27 (SUTURE)
SUT VIC AB 3-0 SH 27X BRD (SUTURE) IMPLANT
TOWEL GREEN STERILE FF (TOWEL DISPOSABLE) ×2 IMPLANT

## 2018-12-05 NOTE — Op Note (Signed)
Preoperative diagnosis: hymenal ring irritation  Postoperative diagnosis: Same  Procedure: vaginoplasty  Surgeon: Standley Dakins. Kennon Rounds, M.D.  Anesthesia: MAC, Audry Pili, MD  Findings: inflammed hymenal ring  EBL: 100 cc  Reason for procedure: Patient is a 48 y.o. yo G2P2002 who is here with pain during intercourse thought to be related to inflammed hymenal ring remnant.   Procedure: Patient was placed in dorsal lithotomy after MAC analgesia. A  catheter was used to drain an unmeasured amount of urine. Time out performed. SCD's in place. Vagina was prepped. Areas of inflammed hymenal remnant grasped with 2 Alis clamps. Elliptical incision made to remove this tissue. Closed primarily with running 2-0 vicryl. Excellent hemostasis noted. This concluded the procedure. Patient tolerated the procedure well. All instrument, needle, and lap counts were correct x 2.  [name], MD 12/05/2018 9:15 AM

## 2018-12-05 NOTE — Interval H&P Note (Signed)
History and Physical Interval Note:  12/05/2018 8:52 AM  Erin Marquez  has presented today for surgery, with the diagnosis of Redundant Hymenal Ring Tissue.  The various methods of treatment have been discussed with the patient and family. After consideration of risks, benefits and other options for treatment, the patient has consented to  Procedure(s) with comments: Vaginoplasty (N/A) - Please have four 2-0 Vicryl on a CT-1 and 24mls of 0.25% Marcaine as a surgical intervention.  The patient's history has been reviewed, patient examined, no change in status, stable for surgery.  I have reviewed the patient's chart and labs.  Questions were answered to the patient's satisfaction.     [name]

## 2018-12-05 NOTE — Transfer of Care (Signed)
Immediate Anesthesia Transfer of Care Note  Patient: Erin Marquez  Procedure(s) Performed: Vaginoplasty (N/A )  Patient Location: PACU  Anesthesia Type:General  Level of Consciousness: awake, alert  and drowsy  Airway & Oxygen Therapy: Patient Spontanous Breathing and Patient connected to face mask oxygen  Post-op Assessment: Report given to RN and Post -op Vital signs reviewed and stable  Post vital signs: Reviewed and stable  Last Vitals:  Vitals Value Taken Time  BP    Temp    Pulse    Resp    SpO2      Last Pain:  Vitals:   12/05/18 0806  TempSrc: Oral  PainSc: 0-No pain         Complications: No apparent anesthesia complications

## 2018-12-05 NOTE — Discharge Instructions (Signed)
Care of a Perineal Tear A perineal tear is a cut or tear (laceration) in the tissue between the opening of the vagina and the anus (perineum). Some women develop a perineal tear during a vaginal birth. This can happen as the baby emerges from the birth canal and the perineum is stretched. There are four degrees of perineal tears based on how deep and long the laceration is:  First degree. This involves a shallow tear at the edge of the vaginal opening that extends slightly into the perineal skin.  Second degree. This involves tearing described in first degree perineal tear, and an additional deeper tear of the vaginal opening and perineal tissues. It may also include tearing of a muscle just under the perineal skin.  Third degree. This involves tearing described in first and second degree perineal tears, with the addition that tearing in the third degree extends into the muscle of the anus (anal sphincter).  Fourth degree. This involves all levels of tears described in first, second, and third degree perineal tears, with the tear in the fourth degree extending into the rectum. First and second degree perineal tears may or may not be stitched closed, depending on their location and appearance. Third and fourth degree perineal tears are stitched closed immediately after the babys birth. What are the risks? Depending on the type of perineal tear you have, you may be at risk for:  Bleeding.  Developing a collection of blood in the perineal tear area (hematoma).  Pain. This may include pain when you urinate, or pain when you have a bowel movement.  Infection at the site of the tear.  Fever.  Trouble controlling your urination or bowels (incontinence).  Painful sex. How to care for a perineal tear Wound care  Take a sitz bath as told by your health care provider. A sitz bath is a warm water bath that is taken while you are sitting down. The water should only come up to your hips and should  cover your buttocks. This can speed up healing. ? Partially fill a bathtub with warm water. You will only need the water to be deep enough to cover your hips and buttocks when you are sitting in it. ? If your health care provider told you to put medicine in the water, follow the directions exactly as told. ? Sit in the water and open the tub drain a little. ? Turn on the warm water again to keep the tub at the correct level. Keep the water running constantly. ? Soak in the water for 15-20 minutes or as told by your health care provider. ? After the sitz bath, pat the affected area dry first. Do not rub it. ? Be careful when you stand up after the sitz bath because you may feel dizzy.  Wash your hands before and after applying medicine to the area.  Wear a sanitary pad as told by your health care provider. Change the pad as often as told by your health care provider.  Leave stitches (sutures), skin glue, or adhesive strips in place. These skin closures may need to stay in place for 2 weeks or longer. If adhesive strip edges start to loosen and curl up, you may trim the loose edges. Do not remove adhesive strips completely unless your health care provider tells you to do that.  Check your wound every day for signs of infection. Check for: ? Redness, swelling, or pain. ? Fluid or blood. ? Warmth. ? Pus or a bad  smell. Managing pain  If directed, put ice on the painful area: ? Put ice in a plastic bag. ? Place a towel between your skin and the bag. ? Leave the ice on for 20 minutes, 2-3 times a day.  Apply a numbing spray to the perineal tear site as told by your health care provider. This may help with discomfort.  Take and apply over-the-counter and prescription medicines only as told by your health care provider.  If told, put about 3 witch hazel-containing hemorrhoid treatment pads on top of your sanitary pad. The witch hazel in the hemorrhoid pads helps with swelling and  discomfort.  Sit on an inflatable ring or pillow. This may provide comfort. General instructions  Squeeze warm water on your perineum after urinating. This should be done from front to back with a squeeze bottle. Pat the area to dry it.  Do not have sex, use tampons, or place anything in your vagina for at least 6 weeks or as told by your health care provider.  Keep all follow-up visits as told by your health care provider. These include any postpartum visits. This is important. Contact a health care provider if:  Your pain is not relieved with medicines.  You have painful urination.  You have redness, swelling, or pain around your tear.  You have fluid or blood coming from your tear.  Your tear feels warm to the touch.  You have pus or a bad smell coming from your tear.  You have a fever. Get help right away if:  Your tear opens.  You cannot urinate.  You have an increase in bleeding.  You have severe pain. Summary  A perineal tear is a cut or tear (laceration) in the tissue between the opening of the vagina and the anus (perineum).  There are four degrees of perineal tears based on how deep and long the laceration is.  First and second-degree perineal tears may or may not be stitched closed, depending on their location and appearance. Third and fourth- degree perineal tears are stitched closed immediately after the babys birth.  Follow your health care provider's instructions for caring for your perineal tear. Know how to manage pain and how to care for your wound. Know when to call your health care provider and when to seek immediate emergency care. This information is not intended to replace advice given to you by your health care provider. Make sure you discuss any questions you have with your health care provider. Document Released: 09/09/2013 Document Revised: 04/07/2017 Document Reviewed: 05/30/2016 Elsevier Patient Education  [place].    *No  Ibuprofen/Tylenol until after 4:00pm today  Post Anesthesia Home Care Instructions  Activity: Get plenty of rest for the remainder of the day. A responsible individual must stay with you for 24 hours following the procedure.  For the next 24 hours, DO NOT: -Drive a car -Paediatric nurse -Drink alcoholic beverages -Take any medication unless instructed by your physician -Make any legal decisions or sign important papers.  Meals: Start with liquid foods such as gelatin or soup. Progress to regular foods as tolerated. Avoid greasy, spicy, heavy foods. If nausea and/or vomiting occur, drink only clear liquids until the nausea and/or vomiting subsides. Call your physician if vomiting continues.  Special Instructions/Symptoms: Your throat may feel dry or sore from the anesthesia or the breathing tube placed in your throat during surgery. If this causes discomfort, gargle with warm salt water. The discomfort should disappear within 24 hours.  If you had a scopolamine patch placed behind your ear for the management of post- operative nausea and/or vomiting:  1. The medication in the patch is effective for 72 hours, after which it should be removed.  Wrap patch in a tissue and discard in the trash. Wash hands thoroughly with soap and water. 2. You may remove the patch earlier than 72 hours if you experience unpleasant side effects which may include dry mouth, dizziness or visual disturbances. 3. Avoid touching the patch. Wash your hands with soap and water after contact with the patch.

## 2018-12-05 NOTE — Anesthesia Procedure Notes (Signed)
Procedure Name: LMA Insertion Date/Time: 12/05/2018 8:53 AM Performed by: Willa Frater, CRNA Pre-anesthesia Checklist: Patient identified, Emergency Drugs available, Suction available and Patient being monitored Patient Re-evaluated:Patient Re-evaluated prior to induction Oxygen Delivery Method: Circle system utilized Preoxygenation: Pre-oxygenation with 100% oxygen Induction Type: IV induction Ventilation: Mask ventilation without difficulty LMA: LMA inserted LMA Size: 4.0 Number of attempts: 1 Airway Equipment and Method: Bite block Placement Confirmation: positive ETCO2 Tube secured with: Tape Dental Injury: Teeth and Oropharynx as per pre-operative assessment

## 2018-12-05 NOTE — Anesthesia Postprocedure Evaluation (Signed)
Anesthesia Post Note  Patient: Erin Marquez  Procedure(s) Performed: Vaginoplasty (N/A )     Patient location during evaluation: PACU Anesthesia Type: General Level of consciousness: awake and alert Pain management: pain level controlled Vital Signs Assessment: post-procedure vital signs reviewed and stable Respiratory status: spontaneous breathing, nonlabored ventilation and respiratory function stable Cardiovascular status: blood pressure returned to baseline and stable Postop Assessment: no apparent nausea or vomiting Anesthetic complications: no    Last Vitals:  Vitals:   12/05/18 1015 12/05/18 1030  BP: 111/73 115/80  Pulse: 63 68  Resp: 12 16  Temp:  36.6 C  SpO2: 100% 100%    Last Pain:  Vitals:   12/05/18 1030  TempSrc:   PainSc: 0-No pain                 Audry Pili

## 2018-12-06 ENCOUNTER — Encounter (HOSPITAL_BASED_OUTPATIENT_CLINIC_OR_DEPARTMENT_OTHER): Payer: Self-pay | Admitting: Family Medicine

## 2018-12-06 ENCOUNTER — Telehealth: Payer: Self-pay | Admitting: Radiology

## 2018-12-06 NOTE — Telephone Encounter (Signed)
Left message for patient with appointment information on 12/24/18 with Dr Kennon Rounds for postop. Instructed to call office if needing to reschedule.

## 2018-12-06 NOTE — Addendum Note (Signed)
Addendum  created 12/06/18 0859 by Tawni Millers, CRNA   Charge Capture section accepted, Visit diagnoses modified

## 2018-12-13 ENCOUNTER — Other Ambulatory Visit: Payer: Self-pay | Admitting: Primary Care

## 2018-12-13 DIAGNOSIS — E118 Type 2 diabetes mellitus with unspecified complications: Secondary | ICD-10-CM

## 2018-12-14 MED FILL — glipiZIDE 10 MG TABS: 10 | 90 days supply | Qty: 180 | Fill #0

## 2018-12-24 ENCOUNTER — Ambulatory Visit (INDEPENDENT_AMBULATORY_CARE_PROVIDER_SITE_OTHER): Payer: 59 | Admitting: Family Medicine

## 2018-12-24 ENCOUNTER — Other Ambulatory Visit: Payer: Self-pay

## 2018-12-24 ENCOUNTER — Encounter: Payer: Self-pay | Admitting: Family Medicine

## 2018-12-24 VITALS — BP 133/84 | HR 92 | Temp 98.7°F | Ht 64.0 in | Wt 192.6 lb

## 2018-12-24 DIAGNOSIS — Z09 Encounter for follow-up examination after completed treatment for conditions other than malignant neoplasm: Secondary | ICD-10-CM

## 2018-12-24 DIAGNOSIS — Z9889 Other specified postprocedural states: Secondary | ICD-10-CM

## 2018-12-24 DIAGNOSIS — N899 Noninflammatory disorder of vagina, unspecified: Secondary | ICD-10-CM

## 2018-12-24 DIAGNOSIS — N898 Other specified noninflammatory disorders of vagina: Secondary | ICD-10-CM

## 2018-12-24 NOTE — Assessment & Plan Note (Signed)
S/p removal, healing well. 

## 2018-12-24 NOTE — Progress Notes (Signed)
   Subjective:    Patient ID: Erin Marquez is a 48 y.o. female presenting with Follow-up (Vaginoplasty)  on 12/24/2018  HPI: Here 2+ weeks following vaginoplasty due to hymenal ring inflammation. She is doing well. She is able to sit better. No discharge.  Review of Systems  Constitutional: Negative for chills and fever.  Respiratory: Negative for shortness of breath.   Cardiovascular: Negative for chest pain.  Gastrointestinal: Negative for abdominal pain, nausea and vomiting.  Genitourinary: Negative for dysuria.  Skin: Negative for rash.      Objective:    BP 133/84 (BP Location: Left Arm, Patient Position: Sitting, Cuff Size: Normal)   Pulse 92   Temp 98.7 F (37.1 C) (Oral)   Ht 5\' 4"  (1.626 m)   Wt 192 lb 9.6 oz (87.4 kg)   LMP 11/30/2018   BMI 33.06 kg/m  Physical Exam Constitutional:      General: She is not in acute distress.    Appearance: She is well-developed.  HENT:     Head: Normocephalic and atraumatic.  Eyes:     General: No scleral icterus. Neck:     Musculoskeletal: Neck supple.  Cardiovascular:     Rate and Rhythm: Normal rate.  Pulmonary:     Effort: Pulmonary effort is normal.  Abdominal:     Palpations: Abdomen is soft.  Genitourinary:    Comments: Vaginal orifice is smooth, hymenal ring is gone. Few sutures noted at base of vagina. Skin:    General: Skin is warm and dry.  Neurological:     Mental Status: She is alert and oriented to person, place, and time.         Assessment & Plan:   Problem List Items Addressed This Visit      Unprioritized   Redundant hymenal ring tissue    S/p removal, healing well.       Other Visit Diagnoses    Postop check    -  Primary      Return if symptoms worsen or fail to improve.  [name] 12/24/2018 3:55 PM

## 2018-12-27 ENCOUNTER — Ambulatory Visit (INDEPENDENT_AMBULATORY_CARE_PROVIDER_SITE_OTHER): Payer: 59 | Admitting: Primary Care

## 2018-12-27 ENCOUNTER — Encounter: Payer: Self-pay | Admitting: Primary Care

## 2018-12-27 ENCOUNTER — Other Ambulatory Visit: Payer: Self-pay

## 2018-12-27 VITALS — BP 124/82 | HR 78 | Temp 97.8°F | Ht 64.0 in | Wt 192.8 lb

## 2018-12-27 DIAGNOSIS — R21 Rash and other nonspecific skin eruption: Secondary | ICD-10-CM

## 2018-12-27 MED ORDER — PREDNISONE 20 MG PO TABS: ORAL_TABLET | ORAL | 0 refills | Status: DC

## 2018-12-27 MED FILL — ATORVASTATIN 40 MG TABLET: 40 | 90 days supply | Qty: 90 | Fill #2

## 2018-12-27 MED FILL — predniSONE 20 MG TABS: 20 | 7 days supply | Qty: 10 | Fill #0

## 2018-12-27 NOTE — Patient Instructions (Signed)
Start prednisone 20 mg tablets. Take 2 tablets daily for three days, then 1 tablet daily for four days.  Please update me Monday next week if no improvement. Call me sooner if you notice increased/darker redness, vesicles, increased pain.  It was a pleasure to see you today!

## 2018-12-27 NOTE — Assessment & Plan Note (Signed)
Unclear cause. Appears to be allergic vs contact dermatitis.  Other differentials include herpes zoster, cellulitis although doesn't quite fit the characteristics.  Given that this rash is the same as her prior rash and dissipated after oral steroids, we will treat the same. Rx for steroid course sent to pharmacy. She will update. Return precautions provided.

## 2018-12-27 NOTE — Progress Notes (Signed)
Subjective:    Patient ID: Erin Marquez, female    DOB: 02-Sep-1970, 48 y.o.   MRN: [id number]  HPI  Erin Marquez is a 48 year old female with a history of type 2 diabetes, hypertension, PCOS, environmental allergies, seborrheic keratosis who presents today with a chief complaint of rash.  Her rash is located to the left posterior shoulder which she first noticed the rash 1-2 weeks ago for which she thought was an insect bite. Her rash is itchy and "prickly". She had this same type of rash appear to her right cheek in early 2020, resolved with steroid pack.    She's tried taken hydroxyzine, hydrocortisone cream, and applied clobetasol 0.05% gel without improvement. She is compliant to her Allegra daily.   She denies changes in soaps, detergents, medications, food. She has been under increased stress.   BP Readings from Last 3 Encounters:  12/27/18 124/82  12/24/18 133/84  12/05/18 115/80     Review of Systems  Constitutional: Negative for fever.  Skin: Positive for rash.  Neurological: Negative for numbness.       Past Medical History:  Diagnosis Date  . Diabetes mellitus without complication ([place])   . Hyperlipidemia   . Hypertension      Social History   Socioeconomic History  . Marital status: Married    Spouse name: Not on file  . Number of children: Not on file  . Years of education: Not on file  . Highest education level: Not on file  Occupational History  . Not on file  Social Needs  . Financial resource strain: Not on file  . Food insecurity    Worry: Not on file    Inability: Not on file  . Transportation needs    Medical: Not on file    Non-medical: Not on file  Tobacco Use  . Smoking status: Never Smoker  . Smokeless tobacco: Never Used  Substance and Sexual Activity  . Alcohol use: Yes    Alcohol/week: 1.0 standard drinks    Types: 1 Standard drinks or equivalent per week    Comment: Monthly  . Drug use: No  . Sexual activity: Yes    Birth  control/protection: None, Surgical    Comment: vasectomy  Lifestyle  . Physical activity    Days per week: Not on file    Minutes per session: Not on file  . Stress: Not on file  Relationships  . Social Herbalist on phone: Not on file    Gets together: Not on file    Attends religious service: Not on file    Active member of club or organization: Not on file    Attends meetings of clubs or organizations: Not on file    Relationship status: Not on file  . Intimate partner violence    Fear of current or ex partner: Not on file    Emotionally abused: Not on file    Physically abused: Not on file    Forced sexual activity: Not on file  Other Topics Concern  . Not on file  Social History Narrative  . Not on file    Past Surgical History:  Procedure Laterality Date  . HYMENECTOMY N/A 12/05/2018   Procedure: Vaginoplasty;  Surgeon: [name], MD;  Location: [facility];  Service: Gynecology;  Laterality: N/A;  Please have four 2-0 Vicryl on a CT-1 and 57ms of 0.25% Marcaine  . VAGINA SURGERY  2010  . WISDOM  TOOTH EXTRACTION      Family History  Problem Relation Age of Onset  . Hypertension Mother   . Stroke Mother   . Diabetes Father   . Hyperthyroidism Father   . Prostate cancer Father     Allergies  Allergen Reactions  . Codeine Nausea Only  . Peanut Oil     Slurred speech    Current Outpatient Medications on File Prior to Visit  Medication Sig Dispense Refill  . acetaminophen (TYLENOL) 650 MG CR tablet Take 1 tablet by mouth 2 (two) times daily.    Marland Kitchen atorvastatin (LIPITOR) 40 MG tablet Take 1 tablet (40 mg total) by mouth daily. For cholesterol. 90 tablet 3  . blood glucose meter kit and supplies KIT Dispense based on patient and insurance preference. Use up to four times daily as directed. (FOR ICD-9 250.00, 250.01). 1 each 0  . Blood Glucose Monitoring Suppl (FREESTYLE LITE) DEVI     . Cholecalciferol (VITAMIN D3) 5000 units CAPS  Take 1 capsule by mouth daily.    . fexofenadine (ALLEGRA) 180 MG tablet Take 180 mg by mouth daily.    Marland Kitchen glipiZIDE (GLUCOTROL) 10 MG tablet TAKE 1 TABLET BY MOUTH TWO TIMES DAILY FOR DIABETES 180 tablet 2  . glucose blood (FREESTYLE LITE) test strip Use as instructed to test blood sugar up to 4 times daily 300 each 2  . Lancets (FREESTYLE) lancets     . lisinopril (ZESTRIL) 10 MG tablet Take 1 tablet (10 mg total) by mouth daily. For blood pressure. 90 tablet 3  . meloxicam (MOBIC) 15 MG tablet Take 15 mg by mouth daily.    . metFORMIN (GLUCOPHAGE) 1000 MG tablet Take 1 tablet (1,000 mg total) by mouth 2 (two) times daily with a meal. For diabetes. 180 tablet 3  . Omega-3 Fatty Acids (FISH OIL) 1200 MG CAPS 2 cap twice daily    . vitamin B-12 (CYANOCOBALAMIN) 100 MCG tablet Take 1 capsule by mouth daily.     No current facility-administered medications on file prior to visit.     BP 124/82   Pulse 78   Temp 97.8 F (36.6 C) (Temporal)   Ht _0  (1.626 m)   Wt 192 lb 12 oz (87.4 kg)   LMP 11/30/2018   SpO2 98%   BMI 33.09 kg/m    Objective:   Physical Exam  Constitutional: She appears well-nourished.  Respiratory: Effort normal.  Skin: Skin is warm and dry. Rash noted. There is erythema.     7-8 cm rounded area of mild erythema to left posterior shoulder extending under left axilla. No vesicles. No increased warmth at this time.           Assessment & Plan:

## 2019-01-21 ENCOUNTER — Ambulatory Visit (INDEPENDENT_AMBULATORY_CARE_PROVIDER_SITE_OTHER): Payer: 59 | Admitting: Primary Care

## 2019-01-21 ENCOUNTER — Encounter: Payer: Self-pay | Admitting: Primary Care

## 2019-01-21 ENCOUNTER — Other Ambulatory Visit: Payer: Self-pay

## 2019-01-21 VITALS — BP 124/80 | HR 78 | Temp 97.8°F | Ht 64.0 in | Wt 194.0 lb

## 2019-01-21 DIAGNOSIS — E118 Type 2 diabetes mellitus with unspecified complications: Secondary | ICD-10-CM

## 2019-01-21 LAB — POCT GLYCOSYLATED HEMOGLOBIN (HGB A1C): Hemoglobin A1C: 8.1 % — AB (ref 4.0–5.6)

## 2019-01-21 NOTE — Progress Notes (Signed)
Subjective:    Patient ID: Erin Marquez, female    DOB: [birth year], 48 y.o.   MRN: [id number]  HPI  Erin Marquez is a 48 year old female who presents today for follow up of type 2 diabetes. She is also needing a refill of her birth control.   Current medications include: Metformin 1000 mg BID, Glipizide 10 mg BID  She is checking her blood glucose 2-3 times daily and is getting readings of:  AM fasting: high 100's to low 200's Before lunch: 130's-180's 2 hours after lunch: 160's Before Dinner: 90's-mid 100's Bedtime: 130's-180's.   Last A1C: 7.7 in May 2020,  Last [place] Exam: Completed in February 2020 Last Foot Exam:  Completed in March 2020 Pneumonia Vaccination: Completed in 2016 ACE/ARB: Lisinopril  Statin: atorvastatin   Diet currently consists of:  Breakfast: Egg, bacon, toast Lunch: Salad, sandwich, vegetables  Dinner: Meat, vegetable/salad, starch  Snacks: Nightly fruit, cheese, cookies, candy Desserts: Daily  Beverages: Mostly water, pepsi  Exercise: She is walking one hour daily with her dog  She has been under a lot of stress recently as she is working to clean out a friend's house. She has found that she's not sleeping well due to mind racing thoughts at night. She thinks this may be contributing to her elevated glucose readings.   BP Readings from Last 3 Encounters:  01/21/19 124/80  12/27/18 124/82  12/24/18 133/84        Review of Systems  Respiratory: Negative for shortness of breath.   Cardiovascular: Negative for chest pain.  Neurological: Negative for dizziness.  Psychiatric/Behavioral: Positive for sleep disturbance. The patient is nervous/anxious.        Past Medical History:  Diagnosis Date  . Diabetes mellitus without complication ([place])   . Hyperlipidemia   . Hypertension      Social History   Socioeconomic History  . Marital status: Married    Spouse name: Not on file  . Number of children: Not on file  . Years of education: Not  on file  . Highest education level: Not on file  Occupational History  . Not on file  Social Needs  . Financial resource strain: Not on file  . Food insecurity    Worry: Not on file    Inability: Not on file  . Transportation needs    Medical: Not on file    Non-medical: Not on file  Tobacco Use  . Smoking status: Never Smoker  . Smokeless tobacco: Never Used  Substance and Sexual Activity  . Alcohol use: Yes    Alcohol/week: 1.0 standard drinks    Types: 1 Standard drinks or equivalent per week    Comment: Monthly  . Drug use: No  . Sexual activity: Yes    Birth control/protection: None, Surgical    Comment: vasectomy  Lifestyle  . Physical activity    Days per week: Not on file    Minutes per session: Not on file  . Stress: Not on file  Relationships  . Social Herbalist on phone: Not on file    Gets together: Not on file    Attends religious service: Not on file    Active member of club or organization: Not on file    Attends meetings of clubs or organizations: Not on file    Relationship status: Not on file  . Intimate partner violence    Fear of current or ex partner: Not on file  Emotionally abused: Not on file    Physically abused: Not on file    Forced sexual activity: Not on file  Other Topics Concern  . Not on file  Social History Narrative  . Not on file    Past Surgical History:  Procedure Laterality Date  . HYMENECTOMY N/A 12/05/2018   Procedure: Vaginoplasty;  Surgeon: [name], MD;  Location: [facility];  Service: Gynecology;  Laterality: N/A;  Please have four 2-0 Vicryl on a CT-1 and 51ms of 0.25% Marcaine  . VAGINA SURGERY  2010  . WISDOM TOOTH EXTRACTION      Family History  Problem Relation Age of Onset  . Hypertension Mother   . Stroke Mother   . Diabetes Father   . Hyperthyroidism Father   . Prostate cancer Father     Allergies  Allergen Reactions  . Codeine Nausea Only  . Peanut Oil      Slurred speech    Current Outpatient Medications on File Prior to Visit  Medication Sig Dispense Refill  . acetaminophen (TYLENOL) 650 MG CR tablet Take 1 tablet by mouth 2 (two) times daily.    .Marland Kitchenatorvastatin (LIPITOR) 40 MG tablet Take 1 tablet (40 mg total) by mouth daily. For cholesterol. 90 tablet 3  . blood glucose meter kit and supplies KIT Dispense based on patient and insurance preference. Use up to four times daily as directed. (FOR ICD-9 250.00, 250.01). 1 each 0  . Blood Glucose Monitoring Suppl (FREESTYLE LITE) DEVI     . Cholecalciferol (VITAMIN D3) 5000 units CAPS Take 1 capsule by mouth daily.    . fexofenadine (ALLEGRA) 180 MG tablet Take 180 mg by mouth daily.    .Marland KitchenglipiZIDE (GLUCOTROL) 10 MG tablet TAKE 1 TABLET BY MOUTH TWO TIMES DAILY FOR DIABETES 180 tablet 2  . glucose blood (FREESTYLE LITE) test strip Use as instructed to test blood sugar up to 4 times daily 300 each 2  . Lancets (FREESTYLE) lancets     . lisinopril (ZESTRIL) 10 MG tablet Take 1 tablet (10 mg total) by mouth daily. For blood pressure. 90 tablet 3  . meloxicam (MOBIC) 15 MG tablet Take 15 mg by mouth daily.    . metFORMIN (GLUCOPHAGE) 1000 MG tablet Take 1 tablet (1,000 mg total) by mouth 2 (two) times daily with a meal. For diabetes. 180 tablet 3  . Omega-3 Fatty Acids (FISH OIL) 1200 MG CAPS 2 cap twice daily    . predniSONE (DELTASONE) 20 MG tablet Take 2 tablets daily for three days then 1 tablet daily for four days. 10 tablet 0  . vitamin B-12 (CYANOCOBALAMIN) 100 MCG tablet Take 1 capsule by mouth daily.     No current facility-administered medications on file prior to visit.     BP 124/80   Pulse 78   Temp 97.8 F (36.6 C) (Temporal)   Ht '5\' 4"'  (1.626 m)   Wt 194 lb (88 kg)   LMP 01/16/2019   SpO2 98%   BMI 33.30 kg/m    Objective:   Physical Exam  Constitutional: She appears well-nourished.  Neck: Neck supple.  Cardiovascular: Normal rate and regular rhythm.  Respiratory: Effort  normal and breath sounds normal.  Skin: Skin is warm and dry.  Psychiatric: She has a normal mood and affect.           Assessment & Plan:

## 2019-01-21 NOTE — Patient Instructions (Signed)
Continue to work on [employer].  Continue exercising. You should be getting 150 minutes of moderate intensity exercise weekly.  You can try Melatonin 5-10 mg one hour prior to bedtime for sleep. Work on decreasing stress.  Schedule a lab only appointment for 3 months to repeat your A1C.  Please schedule a physical with me for February/March 2021. You may also schedule a lab only appointment 3-4 days prior. We will discuss your lab results in detail during your physical.  It was a pleasure to see you today!

## 2019-01-21 NOTE — Assessment & Plan Note (Addendum)
Repeat A1C today of 8.1 which is above goal. Suspect this is a combination of both stress and her diet.   Discussed to work on diet, increase exercise. Repeat A1C in 3 months. Continue current regimen for now.  Foot and eye exams UTD. Pneumonia vaccination UTD. Managed on statin and ACE.  Follow up in 6 months.

## 2019-01-24 NOTE — Telephone Encounter (Signed)
This medication is not current medication list.

## 2019-01-28 ENCOUNTER — Encounter: Payer: Self-pay | Admitting: Family Medicine

## 2019-01-28 DIAGNOSIS — E282 Polycystic ovarian syndrome: Secondary | ICD-10-CM

## 2019-01-28 MED ORDER — NORETHINDRONE ACETATE 5 MG PO TABS: 5.0000 mg | ORAL_TABLET | Freq: Every day | ORAL | 1 refills | Status: DC

## 2019-01-28 MED FILL — NORETHINDRONE 5 MG TABLET: 5 | 30 days supply | Qty: 30 | Fill #0

## 2019-01-31 ENCOUNTER — Other Ambulatory Visit: Payer: Self-pay

## 2019-01-31 ENCOUNTER — Ambulatory Visit (INDEPENDENT_AMBULATORY_CARE_PROVIDER_SITE_OTHER): Payer: 59

## 2019-01-31 DIAGNOSIS — Z23 Encounter for immunization: Secondary | ICD-10-CM | POA: Diagnosis not present

## 2019-02-20 ENCOUNTER — Telehealth: Payer: Self-pay | Admitting: *Deleted

## 2019-02-20 NOTE — Telephone Encounter (Signed)
Spoken to patient and she stated that she is doing much better. The cramping has improved and will call or go to urgent care if it gets worse.

## 2019-02-20 NOTE — Telephone Encounter (Signed)
Patient's husband called (on [place]) requesting an appointment for his wife. Mr. Constantino was transferred to triage because of patient's symptoms. Mr. Pelczar stated that he was there with his wife and she started this morning around 4:00 am with left side stomach pain,.vomiting, diarrhea and really tired. Mr. Darrington stated that she does not have a fever, but feels really cold. Advised Mr. Schuchard that unfortunately we can not bring her into the office with her symptoms. Offered patient a virtual visit with Dr. Glori Bickers today because of her symptoms which he declined. Mr. Ruther stated that he will probably take her to an Urgent Care to be seen.

## 2019-02-20 NOTE — Telephone Encounter (Signed)
Noted  

## 2019-02-20 NOTE — Telephone Encounter (Signed)
Noted, will you call and check on patient?

## 2019-02-22 ENCOUNTER — Emergency Department (INDEPENDENT_AMBULATORY_CARE_PROVIDER_SITE_OTHER): Payer: 59

## 2019-02-22 ENCOUNTER — Other Ambulatory Visit: Payer: Self-pay

## 2019-02-22 ENCOUNTER — Encounter: Payer: Self-pay | Admitting: Emergency Medicine

## 2019-02-22 ENCOUNTER — Emergency Department (INDEPENDENT_AMBULATORY_CARE_PROVIDER_SITE_OTHER)
Admission: EM | Admit: 2019-02-22 | Discharge: 2019-02-22 | Disposition: A | Discharge status: Home / Self Care | Payer: 59 | Source: Home / Self Care

## 2019-02-22 DIAGNOSIS — N201 Calculus of ureter: Secondary | ICD-10-CM | POA: Diagnosis not present

## 2019-02-22 DIAGNOSIS — K760 Fatty (change of) liver, not elsewhere classified: Secondary | ICD-10-CM

## 2019-02-22 DIAGNOSIS — R109 Unspecified abdominal pain: Secondary | ICD-10-CM | POA: Diagnosis not present

## 2019-02-22 DIAGNOSIS — K8020 Calculus of gallbladder without cholecystitis without obstruction: Secondary | ICD-10-CM

## 2019-02-22 DIAGNOSIS — R1032 Left lower quadrant pain: Secondary | ICD-10-CM | POA: Diagnosis not present

## 2019-02-22 DIAGNOSIS — R1111 Vomiting without nausea: Secondary | ICD-10-CM | POA: Diagnosis not present

## 2019-02-22 DIAGNOSIS — N1330 Unspecified hydronephrosis: Secondary | ICD-10-CM

## 2019-02-22 DIAGNOSIS — N200 Calculus of kidney: Secondary | ICD-10-CM | POA: Diagnosis not present

## 2019-02-22 DIAGNOSIS — N132 Hydronephrosis with renal and ureteral calculous obstruction: Secondary | ICD-10-CM

## 2019-02-22 LAB — POCT CBC W AUTO DIFF (K'VILLE URGENT CARE)

## 2019-02-22 LAB — COMPLETE METABOLIC PANEL WITH GFR
AG Ratio: 1.7 (calc) (ref 1.0–2.5)
ALT: 15 U/L (ref 6–29)
AST: 13 U/L (ref 10–35)
Albumin: 4.3 g/dL (ref 3.6–5.1)
Alkaline phosphatase (APISO): 30 U/L — ABNORMAL LOW (ref 31–125)
BUN/Creatinine Ratio: 12 (calc) (ref 6–22)
BUN: 16 mg/dL (ref 7–25)
CO2: 23 mmol/L (ref 20–32)
Calcium: 9.3 mg/dL (ref 8.6–10.2)
Chloride: 101 mmol/L (ref 98–110)
Creat: 1.36 mg/dL — ABNORMAL HIGH (ref 0.50–1.10)
GFR, Est African American: 54 mL/min/{1.73_m2} — ABNORMAL LOW (ref >=60)
GFR, Est Non African American: 47 mL/min/{1.73_m2} — ABNORMAL LOW (ref >=60)
Globulin: 2.5 g/dL (calc) (ref 1.9–3.7)
Glucose, Bld: 174 mg/dL — ABNORMAL HIGH (ref 65–99)
Potassium: 4.8 mmol/L (ref 3.5–5.3)
Sodium: 137 mmol/L (ref 135–146)
Total Bilirubin: 1.2 mg/dL (ref 0.2–1.2)
Total Protein: 6.8 g/dL (ref 6.1–8.1)

## 2019-02-22 LAB — POCT URINALYSIS DIP (MANUAL ENTRY)
Bilirubin, UA: NEGATIVE
Glucose, UA: 100 mg/dL — AB
Ketones, POC UA: NEGATIVE mg/dL
Nitrite, UA: NEGATIVE
Protein Ur, POC: NEGATIVE mg/dL
Spec Grav, UA: 1.025 (ref 1.010–1.025)
Urobilinogen, UA: 0.2 E.U./dL
pH, UA: 5 (ref 5.0–8.0)

## 2019-02-22 MED ORDER — TAMSULOSIN HCL 0.4 MG PO CAPS: 0.4000 mg | ORAL_CAPSULE | Freq: Every day | ORAL | 0 refills | Status: DC

## 2019-02-22 MED ORDER — HYDROCODONE-ACETAMINOPHEN 5-325 MG PO TABS: ORAL_TABLET | ORAL | 0 refills | Status: DC

## 2019-02-22 MED ORDER — KETOROLAC TROMETHAMINE 60 MG/2ML IM SOLN
60.0000 mg | Freq: Once | INTRAMUSCULAR | Status: AC
  Administered 2019-02-22: 60 mg via INTRAMUSCULAR

## 2019-02-22 MED ORDER — NAPROXEN 500 MG PO TABS: 500.0000 mg | ORAL_TABLET | Freq: Two times a day (BID) | ORAL | 0 refills | Status: DC

## 2019-02-22 MED FILL — NAPROXEN 500 MG TABS: 500 | 15 days supply | Qty: 30 | Fill #0

## 2019-02-22 MED FILL — TAMSULOSIN HCL 0.4 MG CAP: 0.4 | 14 days supply | Qty: 14 | Fill #0

## 2019-02-22 NOTE — ED Triage Notes (Signed)
Lower left quadrant pain x 3 days, family hx kidney stones

## 2019-02-22 NOTE — Discharge Instructions (Addendum)
°  Your pain appears to be due to kidney stones on your left side.  The main stone is 5x55mm.  About 50-60% of patients with stones this large can pass them without medical intervention.  Please stay well hydrated and take your medication as prescribed. It is recommended you call today to schedule a urology appointment for next week in case the stone does not pain on its own.  You can often cancel appointments within 24 hours notice if you wind up passing the stone and no longer need the follow up. Because this is your first stone it may be beneficial to keep the appointment either way.  You do have a small stone in your gallbladder that is not causing any blockage at his time but the stone can potentially cause pain and symptoms in the future- stomach upset after eating, especially after fried/fatty foods.   On your ultrasound today there was also a cyst appearing mass in the Right ovary. It is recommended your receive a nonemergent pelvic ultrasound for further evaluation of this cyst.  Your primary care provider or GYN would be able to schedule this for you and follow up on the findings.  Call 911 or go to the closest hospital if symptoms worsening- fever, vomiting, unable to pass urine, or other new concerning symptoms develop.

## 2019-02-22 NOTE — ED Provider Notes (Signed)
[name]    CSN: [id number] Arrival date & time: 02/22/19  8850      History   Chief Complaint Chief Complaint  Patient presents with   Abdominal Pain    HPI Erin Marquez is a 48 y.o. female.   HPI Erin Marquez is a 48 y.o. female presenting to UC with c/o 3 days worsening Left flank pain radiating into Left lower abdomen. Pain waxes and wanes, sharp and cramping at times.  Pain woke her from her sleep last night. Associated nausea and vomiting the first day. Today, only nausea.    She has tried ibuprofen w/o relief. Pt notes she had unmedicated childbirth and pain is about the same in severity. Mild irritation with urination but denies pain, frequency or hematuria.  Denies fever or chills. No prior hx of kidney stones but her father does have a hx of kidney stones.  Past Medical History:  Diagnosis Date   Diabetes mellitus without complication ([place])    Hyperlipidemia    Hypertension     Patient Active Problem List   Diagnosis Date Noted   Rash and nonspecific skin eruption 12/27/2018   Otalgia of both ears 11/15/2018   Redundant hymenal ring tissue 11/06/2018   Abnormal TSH 07/04/2018   Seborrheic keratosis 09/06/2017   PCOS (polycystic ovarian syndrome) 06/13/2017   Type 2 diabetes mellitus with complication, without long-term current use of insulin ([place]) 06/13/2017   Hypertension associated with diabetes ([place]) 06/13/2017   Mixed diabetic hyperlipidemia associated with type 2 diabetes mellitus ([place]) 06/13/2017   Vitamin D deficiency 06/13/2017   Environmental and seasonal allergies 06/13/2017   Chronic Arthralgias- bilateral hands and feet 06/13/2017   Family history of rheumatoid arthritis 06/13/2017   SK (solar keratosis)- two on face 06/13/2017   Elevated liver enzymes- ALT and AST since  06/13/2017   Depression, recurrent ([place]) 06/16/2014   Sciatica 05/13/2014    Past Surgical History:  Procedure Laterality Date    HYMENECTOMY N/A 12/05/2018   Procedure: Vaginoplasty;  Surgeon: [name], MD;  Location: [facility];  Service: Gynecology;  Laterality: N/A;  Please have four 2-0 Vicryl on a CT-1 and 45ms of 0.25% Marcaine   VAGINA SURGERY  2010   WISDOM TOOTH EXTRACTION      OB History    Gravida  2   Para  2   Term  2   Preterm      AB      Living  2     SAB      TAB      Ectopic      Multiple      Live Births               Home Medications    Prior to Admission medications   Medication Sig Start Date End Date Taking? Authorizing Provider  acetaminophen (TYLENOL) 650 MG CR tablet Take 1 tablet by mouth 2 (two) times daily.    [provider]  atorvastatin (LIPITOR) 40 MG tablet Take 1 tablet (40 mg total) by mouth daily. For cholesterol. 07/04/18   CPleas Koch NP  blood glucose meter kit and supplies KIT Dispense based on patient and insurance preference. Use up to four times daily as directed. (FOR ICD-9 250.00, 250.01). 07/04/18   CPleas Koch NP  Blood Glucose Monitoring Suppl (FREESTYLE LITE) DEVI  07/04/18   [provider]  Cholecalciferol (VITAMIN D3) 5000 units CAPS Take 1 capsule by  mouth daily.    [provider]  fexofenadine (ALLEGRA) 180 MG tablet Take 180 mg by mouth daily.    [provider]  glipiZIDE (GLUCOTROL) 10 MG tablet TAKE 1 TABLET BY MOUTH TWO TIMES DAILY FOR DIABETES 12/14/18   Pleas Koch, NP  glucose blood (FREESTYLE LITE) test strip Use as instructed to test blood sugar up to 4 times daily 07/25/18   Pleas Koch, NP  HYDROcodone-acetaminophen (NORCO/VICODIN) 5-325 MG tablet You may take 1 tab every 6-8 hours as needed for severe pain 02/22/19   Noe Gens, PA-C  Lancets (FREESTYLE) lancets  07/04/18   [provider]  lisinopril (ZESTRIL) 10 MG tablet Take 1 tablet (10 mg total) by mouth daily. For blood pressure. 09/10/18   Pleas Koch, NP  meloxicam  (MOBIC) 15 MG tablet Take 15 mg by mouth daily.    [provider]  metFORMIN (GLUCOPHAGE) 1000 MG tablet Take 1 tablet (1,000 mg total) by mouth 2 (two) times daily with a meal. For diabetes. 09/17/18   Pleas Koch, NP  naproxen (NAPROSYN) 500 MG tablet Take 1 tablet (500 mg total) by mouth 2 (two) times daily. 02/22/19   Noe Gens, PA-C  norethindrone (AYGESTIN) 5 MG tablet Take 1 tablet (5 mg total) by mouth daily. To initiate a cycle 01/28/19   [name], MD  Omega-3 Fatty Acids (FISH OIL) 1200 MG CAPS 2 cap twice daily 07/05/17   Opalski, Neoma Laming, DO  vitamin B-12 (CYANOCOBALAMIN) 100 MCG tablet Take 1 capsule by mouth daily.    [provider]    Family History Family History  Problem Relation Age of Onset   Hypertension Mother    Stroke Mother    Diabetes Father    Hyperthyroidism Father    Prostate cancer Father     Social History Social History   Tobacco Use   Smoking status: Never Smoker   Smokeless tobacco: Never Used  Substance Use Topics   Alcohol use: Yes    Alcohol/week: 1.0 standard drinks    Types: 1 Standard drinks or equivalent per week    Comment: Monthly   Drug use: No     Allergies   Codeine and Peanut oil   Review of Systems Review of Systems  Constitutional: Negative for chills and fever.  Gastrointestinal: Positive for abdominal pain, nausea and vomiting. Negative for diarrhea.  Genitourinary: Positive for flank pain (Left). Negative for decreased urine volume, dysuria, frequency, hematuria, pelvic pain and urgency.  Musculoskeletal: Positive for back pain (Left lower).  Neurological: Negative for dizziness, light-headedness and headaches.     Physical Exam Triage Vital Signs ED Triage Vitals  Enc Vitals Group     BP 02/22/19 0844 128/88     Pulse Rate 02/22/19 0844 87     Resp --      Temp 02/22/19 0844 (!) 97.2 F (36.2 C)     Temp Source 02/22/19 0844 Oral     SpO2 02/22/19 0844 99 %      Weight 02/22/19 0845 185 lb (83.9 kg)     Height 02/22/19 0845 '5\' 4"'  (1.626 m)     Head Circumference --      Peak Flow --      Pain Score 02/22/19 0845 9     Pain Loc --      Pain Edu? --      Excl. in [place]? --    No data found.  Updated Vital Signs BP  128/88 (BP Location: Right Arm)    Pulse 87    Temp (!) 97.2 F (36.2 C) (Oral)    Ht '5\' 4"'  (1.626 m)    Wt 185 lb (83.9 kg)    LMP 01/27/2019 (Approximate)    SpO2 99%    BMI 31.76 kg/m   Visual Acuity Right Eye Distance:   Left Eye Distance:   Bilateral Distance:    Right Eye Near:   Left Eye Near:    Bilateral Near:     Physical Exam Vitals signs and nursing note reviewed.  Constitutional:      General: She is not in acute distress.    Appearance: She is well-developed. She is obese. She is not ill-appearing, toxic-appearing or diaphoretic.  HENT:     Head: Normocephalic and atraumatic.  Neck:     Musculoskeletal: Normal range of motion.  Cardiovascular:     Rate and Rhythm: Normal rate and regular rhythm.  Pulmonary:     Effort: Pulmonary effort is normal. No respiratory distress.     Breath sounds: Normal breath sounds.  Abdominal:     General: Bowel sounds are normal. There is no distension.     Palpations: Abdomen is soft.     Tenderness: There is abdominal tenderness in the left lower quadrant. There is left CVA tenderness. There is no right CVA tenderness, guarding or rebound. Negative signs include Murphy's sign and McBurney's sign.  Musculoskeletal: Normal range of motion.  Skin:    General: Skin is warm and dry.     Findings: No rash.  Neurological:     Mental Status: She is alert and oriented to person, place, and time.  Psychiatric:        Behavior: Behavior normal.      UC Treatments / Results  Labs (all labs ordered are listed, but only abnormal results are displayed) Labs Reviewed  POCT URINALYSIS DIP (MANUAL ENTRY) - Abnormal; Notable for the following components:      Result Value    Glucose, UA =100 (*)    Blood, UA small (*)    Leukocytes, UA Small (1+) (*)    All other components within normal limits  URINE CULTURE  COMPLETE METABOLIC PANEL WITH GFR  POCT CBC W AUTO DIFF ([facility])    EKG   Radiology Ct Renal Stone Study  Result Date: 02/22/2019 CLINICAL DATA:  Left flank pain with nausea and vomiting EXAM: CT ABDOMEN AND PELVIS WITHOUT CONTRAST TECHNIQUE: Multidetector CT imaging of the abdomen and pelvis was performed following the standard protocol without oral or IV contrast. COMPARISON:  None. FINDINGS: Lower chest: There is a 5 x 4 mm nodular opacity abutting the pleura in the posterior aspect of the superior segment of the left lower lobe. Lung bases otherwise are clear. There are foci of coronary artery calcification. Hepatobiliary: There is hepatic steatosis. No focal liver lesions are evident on this noncontrast enhanced study. There is an apparent Riedel's lobe on the right, an anatomic variant. There is a small gallstone within the gallbladder. The gallbladder wall is not appreciably thickened. There is no biliary duct dilatation. Pancreas: There is no pancreatic mass or inflammatory focus. Spleen: No splenic lesions are evident. Adrenals/Urinary Tract: Adrenals bilaterally appear unremarkable. The left kidney is edematous with perinephric stranding on the left. There is slight perinephric stranding on the right. There is no renal mass on either side. There is mild to moderate hydronephrosis on the left. There is no hydronephrosis on the right.  There is a 1 mm calculus in the lower pole left kidney. No calculus is evident on the right. There is a calculus at the left ureterovesical junction measuring 5 x 3 mm. No other ureteral calculi are evident. The urinary bladder is midline with wall thickness within normal limits for degree of distention. Stomach/Bowel: There is no appreciable bowel wall or mesenteric thickening. There is no appreciable bowel  obstruction. The terminal ileum appears unremarkable. There is no free air or portal venous air. Vascular/Lymphatic: There is no abdominal aortic aneurysm. There is minimal calcification at the origin of the left renal artery. No adenopathy is evident in the abdomen or pelvis. Reproductive: The uterus is retroverted. There is a cystic area in the right ovary measuring 3.8 x 3.1 cm. No other pelvic mass evident. Other: Appendix appears unremarkable. No abscess or ascites is evident in the abdomen or pelvis. Musculoskeletal: There is degenerative change in the lumbar spine. No blastic or lytic bone lesions. No intramuscular or abdominal wall lesions evident. IMPRESSION: 1. 5 x 3 mm calculus at the left ureterovesical junction with mild to moderate left hydronephrosis and perinephric stranding. 2. 1 mm calculus in the lower pole of the left kidney. 3. Hepatic steatosis. 4. Cholelithiasis. 5. Cystic area in the right ovary measuring 3.8 x 3.1 cm. Recommend nonemergent pelvic ultrasound for further characterization. 6. Coronary artery calcification. Electronically Signed   By: Lowella Grip III M.D.   On: 02/22/2019 09:36    Procedures Procedures (including critical care time)  Medications Ordered in UC Medications  ketorolac (TORADOL) injection 60 mg (60 mg Intramuscular Given 02/22/19 1001)    Initial Impression / Assessment and Plan / UC Course  I have reviewed the triage vital signs and the nursing notes.  Pertinent labs & imaging results that were available during my care of the patient were reviewed by me and considered in my medical decision making (see chart for details).     Reviewed imaging with pt Will attempt outpatient conservative tx at this time. CMP and urine culture pending.  AVS provided  Final Clinical Impressions(s) / UC Diagnoses   Final diagnoses:  Abdominal pain, left lower quadrant  Left flank pain  Left renal stone  Left ureteral stone  Hydronephrosis, left    Calculus of gallbladder without cholecystitis without obstruction  Hepatic steatosis     Discharge Instructions      Your pain appears to be due to kidney stones on your left side.  The main stone is 5x81m.  About 50-60% of patients with stones this large can pass them without medical intervention.  Please stay well hydrated and take your medication as prescribed. It is recommended you call today to schedule a urology appointment for next week in case the stone does not pain on its own.  You can often cancel appointments within 24 hours notice if you wind up passing the stone and no longer need the follow up. Because this is your first stone it may be beneficial to keep the appointment either way.  You do have a small stone in your gallbladder that is not causing any blockage at his time but the stone can potentially cause pain and symptoms in the future- stomach upset after eating, especially after fried/fatty foods.   On your ultrasound today there was also a cyst appearing mass in the Right ovary. It is recommended your receive a nonemergent pelvic ultrasound for further evaluation of this cyst.  Your primary care provider or GYN would be  able to schedule this for you and follow up on the findings.  Call 911 or go to the closest hospital if symptoms worsening- fever, vomiting, unable to pass urine, or other new concerning symptoms develop.     ED Prescriptions    Medication Sig Dispense Auth. Provider   naproxen (NAPROSYN) 500 MG tablet Take 1 tablet (500 mg total) by mouth 2 (two) times daily. 30 tablet Noe Gens, [place]   HYDROcodone-acetaminophen (NORCO/VICODIN) 5-325 MG tablet You may take 1 tab every 6-8 hours as needed for severe pain 10 tablet Noe Gens, PA-C     I have reviewed the PDMP during this encounter.   Noe Gens, PA-C 02/22/19 1034

## 2019-02-22 NOTE — ED Notes (Signed)
STAT LAB# [id number], 10:39AM

## 2019-02-23 LAB — URINE CULTURE
MICRO NUMBER:: 998383
SPECIMEN QUALITY:: ADEQUATE

## 2019-02-25 ENCOUNTER — Telehealth (HOSPITAL_COMMUNITY): Payer: Self-pay | Admitting: Emergency Medicine

## 2019-02-25 ENCOUNTER — Other Ambulatory Visit: Payer: Self-pay | Admitting: *Deleted

## 2019-02-25 DIAGNOSIS — E282 Polycystic ovarian syndrome: Secondary | ICD-10-CM

## 2019-02-25 NOTE — Telephone Encounter (Signed)
Cmp shows possible decrease in kidney function, notified patient about CMP results, forwarding to her PCP for review and will have her follow up. Patient contacted and made aware of  all  results, all questions answered

## 2019-02-26 ENCOUNTER — Telehealth: Payer: Self-pay

## 2019-02-26 ENCOUNTER — Other Ambulatory Visit: Payer: Self-pay | Admitting: Primary Care

## 2019-02-26 DIAGNOSIS — N289 Disorder of kidney and ureter, unspecified: Secondary | ICD-10-CM

## 2019-02-26 NOTE — Telephone Encounter (Signed)
LVM w COVID screen and back lab info

## 2019-02-28 ENCOUNTER — Other Ambulatory Visit (INDEPENDENT_AMBULATORY_CARE_PROVIDER_SITE_OTHER): Payer: 59

## 2019-02-28 ENCOUNTER — Other Ambulatory Visit: Payer: Self-pay

## 2019-02-28 DIAGNOSIS — N289 Disorder of kidney and ureter, unspecified: Secondary | ICD-10-CM

## 2019-02-28 LAB — BASIC METABOLIC PANEL
BUN: 20 mg/dL (ref 6–23)
CO2: 25 mEq/L (ref 19–32)
Calcium: 9.4 mg/dL (ref 8.4–10.5)
Chloride: 101 mEq/L (ref 96–112)
Creatinine, Ser: 1 mg/dL (ref 0.40–1.20)
GFR: 59.68 mL/min — ABNORMAL LOW (ref >60.00)
Glucose, Bld: 187 mg/dL — ABNORMAL HIGH (ref 70–99)
Potassium: 4.4 mEq/L (ref 3.5–5.1)
Sodium: 137 mEq/L (ref 135–145)

## 2019-03-04 ENCOUNTER — Other Ambulatory Visit: Payer: Self-pay

## 2019-03-04 ENCOUNTER — Ambulatory Visit (HOSPITAL_COMMUNITY)
Admission: RE | Admit: 2019-03-04 | Discharge: 2019-03-04 | Disposition: A | Discharge status: Home / Self Care | Payer: 59 | Source: Ambulatory Visit | Attending: Family Medicine | Admitting: Family Medicine

## 2019-03-04 DIAGNOSIS — N939 Abnormal uterine and vaginal bleeding, unspecified: Secondary | ICD-10-CM | POA: Diagnosis not present

## 2019-03-04 DIAGNOSIS — E282 Polycystic ovarian syndrome: Secondary | ICD-10-CM | POA: Insufficient documentation

## 2019-03-11 ENCOUNTER — Telehealth: Payer: Self-pay | Admitting: Primary Care

## 2019-03-11 NOTE — Telephone Encounter (Signed)
-----   Message from [place], [place] sent at 03/07/2019  9:35 AM EDT ----- Patient wanted to know if she can change her metformin to extended release.

## 2019-03-11 NOTE — Telephone Encounter (Signed)
Per DPR, left detail message of Kate Clark's comments for patient to call back 

## 2019-03-11 NOTE — Telephone Encounter (Signed)
Please notify patient that she can change to the extended release, inform her of the recent recall of metformin and that most pharmacies have cleared out the contaminated Metformin ER lots. If she'd like to proceed then she'll take 2 tablets once daily with a meal. Have her watch her glucose readings. Is she still wanting to move forward?

## 2019-03-13 MED FILL — metFORMIN HCL 1000 MG TABS: 1000 | 90 days supply | Qty: 180 | Fill #1

## 2019-03-13 MED FILL — LISINOPRIL 10 MG TABS: 10 | 90 days supply | Qty: 90 | Fill #1

## 2019-03-13 MED FILL — glipiZIDE 10 MG TABS: 10 | 90 days supply | Qty: 180 | Fill #1

## 2019-03-15 DIAGNOSIS — N201 Calculus of ureter: Secondary | ICD-10-CM | POA: Diagnosis not present

## 2019-03-15 MED FILL — TAMSULOSIN HCL 0.4 MG CAP: 0.4 | 30 days supply | Qty: 30 | Fill #0

## 2019-03-15 MED FILL — ONDANSETRON HCL 8 MG TABLET: 8 | 5 days supply | Qty: 30 | Fill #0

## 2019-03-15 NOTE — Telephone Encounter (Signed)
Per DPR, left detail message of Kate Clark's comments for patient to call back 

## 2019-03-19 ENCOUNTER — Other Ambulatory Visit: Payer: Self-pay | Admitting: Urology

## 2019-03-19 ENCOUNTER — Other Ambulatory Visit (HOSPITAL_COMMUNITY)
Admission: RE | Admit: 2019-03-19 | Discharge: 2019-03-19 | Disposition: A | Discharge status: Home / Self Care | Payer: 59 | Source: Ambulatory Visit | Attending: Urology | Admitting: Urology

## 2019-03-19 DIAGNOSIS — Z20828 Contact with and (suspected) exposure to other viral communicable diseases: Secondary | ICD-10-CM | POA: Diagnosis not present

## 2019-03-19 DIAGNOSIS — Z01812 Encounter for preprocedural laboratory examination: Secondary | ICD-10-CM | POA: Insufficient documentation

## 2019-03-20 ENCOUNTER — Other Ambulatory Visit: Payer: Self-pay

## 2019-03-20 ENCOUNTER — Encounter (HOSPITAL_BASED_OUTPATIENT_CLINIC_OR_DEPARTMENT_OTHER): Payer: Self-pay | Admitting: *Deleted

## 2019-03-20 NOTE — Progress Notes (Addendum)
Spoke w/ via phone for pre-op interview---[facility] needs dos----  I stat 8 , urine poct           Lab results------06-29-18 ekg char/.epic COVID test ------03-20-2019 Arrive at -------1030 am 03-22-2019 NPO after ------midnight Medications to take morning of surgery -----fexofenadine, atorvastatin, flomax Diabetic medication -----take none day of surgery Patient Special Instructions ----- Pre-Op special Istructions ----- Patient verbalized understanding of instructions that were given at this phone interview. Patient denies shortness of breath, chest pain, fever, cough a this phone interview.

## 2019-03-21 LAB — NOVEL CORONAVIRUS, NAA (HOSP ORDER, SEND-OUT TO REF LAB; TAT 18-24 HRS): SARS-CoV-2, NAA: NOT DETECTED

## 2019-03-21 NOTE — Anesthesia Preprocedure Evaluation (Addendum)
Anesthesia Evaluation  Patient identified by MRN, date of birth, ID band Patient awake    Reviewed: Allergy & Precautions, NPO status , Patient's Chart, lab work & pertinent test results  Airway Mallampati: II  TM Distance: >3 FB Neck ROM: Full    Dental no notable dental hx.    Pulmonary neg pulmonary ROS,    Pulmonary exam normal breath sounds clear to auscultation       Cardiovascular hypertension, Pt. on medications Normal cardiovascular exam Rhythm:Regular Rate:Normal  ECG: ST   Neuro/Psych PSYCHIATRIC DISORDERS Depression negative neurological ROS     GI/Hepatic negative GI ROS, Neg liver ROS,   Endo/Other  diabetes, Oral Hypoglycemic Agents  Renal/GU negative Renal ROS     Musculoskeletal Sciatica    Abdominal (+) + obese,   Peds  Hematology HLD   Anesthesia Other Findings LEFT URETEROPELVIC JUNCTION STONE  Reproductive/Obstetrics                            Anesthesia Physical Anesthesia Plan  ASA: II  Anesthesia Plan: General   Post-op Pain Management:    Induction: Intravenous  PONV Risk Score and Plan: 4 or greater and Dexamethasone, Ondansetron and Treatment may vary due to age or medical condition  Airway Management Planned: LMA  Additional Equipment:   Intra-op Plan:   Post-operative Plan: Extubation in OR  Informed Consent: I have reviewed the patients History and Physical, chart, labs and discussed the procedure including the risks, benefits and alternatives for the proposed anesthesia with the patient or authorized representative who has indicated his/her understanding and acceptance.     Dental advisory given  Plan Discussed with: CRNA  Anesthesia Plan Comments:        Anesthesia Quick Evaluation

## 2019-03-22 ENCOUNTER — Ambulatory Visit (HOSPITAL_BASED_OUTPATIENT_CLINIC_OR_DEPARTMENT_OTHER)
Admission: RE | Admit: 2019-03-22 | Discharge: 2019-03-22 | Disposition: A | Discharge status: Home / Self Care | Payer: 59 | Attending: Urology | Admitting: Urology

## 2019-03-22 ENCOUNTER — Other Ambulatory Visit: Payer: Self-pay

## 2019-03-22 ENCOUNTER — Ambulatory Visit (HOSPITAL_BASED_OUTPATIENT_CLINIC_OR_DEPARTMENT_OTHER): Payer: 59 | Admitting: Anesthesiology

## 2019-03-22 ENCOUNTER — Ambulatory Visit (HOSPITAL_COMMUNITY): Payer: 59

## 2019-03-22 ENCOUNTER — Encounter (HOSPITAL_BASED_OUTPATIENT_CLINIC_OR_DEPARTMENT_OTHER): Payer: Self-pay | Admitting: Anesthesiology

## 2019-03-22 ENCOUNTER — Encounter (HOSPITAL_BASED_OUTPATIENT_CLINIC_OR_DEPARTMENT_OTHER)
Admission: RE | Disposition: A | Discharge status: Home / Self Care | Payer: Self-pay | Source: Home / Self Care | Attending: Urology

## 2019-03-22 DIAGNOSIS — Z793 Long term (current) use of hormonal contraceptives: Secondary | ICD-10-CM | POA: Diagnosis not present

## 2019-03-22 DIAGNOSIS — Z885 Allergy status to narcotic agent status: Secondary | ICD-10-CM | POA: Diagnosis not present

## 2019-03-22 DIAGNOSIS — Z91030 Bee allergy status: Secondary | ICD-10-CM | POA: Diagnosis not present

## 2019-03-22 DIAGNOSIS — E119 Type 2 diabetes mellitus without complications: Secondary | ICD-10-CM | POA: Insufficient documentation

## 2019-03-22 DIAGNOSIS — E785 Hyperlipidemia, unspecified: Secondary | ICD-10-CM | POA: Insufficient documentation

## 2019-03-22 DIAGNOSIS — Z7984 Long term (current) use of oral hypoglycemic drugs: Secondary | ICD-10-CM | POA: Diagnosis not present

## 2019-03-22 DIAGNOSIS — E782 Mixed hyperlipidemia: Secondary | ICD-10-CM | POA: Diagnosis not present

## 2019-03-22 DIAGNOSIS — N200 Calculus of kidney: Secondary | ICD-10-CM | POA: Diagnosis not present

## 2019-03-22 DIAGNOSIS — Z91010 Allergy to peanuts: Secondary | ICD-10-CM | POA: Insufficient documentation

## 2019-03-22 DIAGNOSIS — I10 Essential (primary) hypertension: Secondary | ICD-10-CM | POA: Diagnosis not present

## 2019-03-22 DIAGNOSIS — Z79899 Other long term (current) drug therapy: Secondary | ICD-10-CM | POA: Diagnosis not present

## 2019-03-22 DIAGNOSIS — K760 Fatty (change of) liver, not elsewhere classified: Secondary | ICD-10-CM | POA: Insufficient documentation

## 2019-03-22 DIAGNOSIS — Z791 Long term (current) use of non-steroidal anti-inflammatories (NSAID): Secondary | ICD-10-CM | POA: Insufficient documentation

## 2019-03-22 DIAGNOSIS — N202 Calculus of kidney with calculus of ureter: Secondary | ICD-10-CM | POA: Diagnosis present

## 2019-03-22 HISTORY — PX: CYSTOSCOPY/URETEROSCOPY/HOLMIUM LASER/STENT PLACEMENT: SHX6546

## 2019-03-22 LAB — GLUCOSE, CAPILLARY: Glucose-Capillary: 160 mg/dL — ABNORMAL HIGH (ref 70–99)

## 2019-03-22 LAB — POCT I-STAT, CHEM 8
BUN: 14 mg/dL (ref 6–20)
Calcium, Ion: 1.25 mmol/L (ref 1.15–1.40)
Chloride: 100 mmol/L (ref 98–111)
Creatinine, Ser: 0.8 mg/dL (ref 0.44–1.00)
Glucose, Bld: 171 mg/dL — ABNORMAL HIGH (ref 70–99)
HCT: 38 % (ref 36.0–46.0)
Hemoglobin: 12.9 g/dL (ref 12.0–15.0)
Potassium: 4.7 mmol/L (ref 3.5–5.1)
Sodium: 139 mmol/L (ref 135–145)
TCO2: 26 mmol/L (ref 22–32)

## 2019-03-22 LAB — POCT PREGNANCY, URINE: Preg Test, Ur: NEGATIVE

## 2019-03-22 SURGERY — CYSTOSCOPY/URETEROSCOPY/HOLMIUM LASER/STENT PLACEMENT
Anesthesia: General | Site: Ureter | Laterality: Left

## 2019-03-22 MED ORDER — PROPOFOL 10 MG/ML IV BOLUS
INTRAVENOUS | Status: DC | PRN
  Administered 2019-03-22: 200 mg via INTRAVENOUS

## 2019-03-22 MED ORDER — DEXAMETHASONE SODIUM PHOSPHATE 4 MG/ML IJ SOLN
INTRAMUSCULAR | Status: DC | PRN
  Administered 2019-03-22: 5 mg via INTRAVENOUS

## 2019-03-22 MED ORDER — CEFAZOLIN SODIUM-DEXTROSE 2-4 GM/100ML-% IV SOLN
2.0000 g | Freq: Once | INTRAVENOUS | Status: AC
  Administered 2019-03-22: 2 g via INTRAVENOUS
  Filled 2019-03-22: qty 100

## 2019-03-22 MED ORDER — ACETAMINOPHEN 500 MG PO TABS
1000.0000 mg | ORAL_TABLET | Freq: Once | ORAL | Status: AC
  Administered 2019-03-22: 11:00:00 1000 mg via ORAL
  Filled 2019-03-22: qty 2

## 2019-03-22 MED ORDER — IOHEXOL 300 MG/ML  SOLN
INTRAMUSCULAR | Status: DC | PRN
  Administered 2019-03-22: 7 mL via URETHRAL

## 2019-03-22 MED ORDER — ACETAMINOPHEN 500 MG PO TABS
ORAL_TABLET | ORAL | Status: AC
  Filled 2019-03-22: qty 2

## 2019-03-22 MED ORDER — HYDROMORPHONE HCL 1 MG/ML IJ SOLN
0.2500 mg | INTRAMUSCULAR | Status: DC | PRN
  Filled 2019-03-22: qty 0.5

## 2019-03-22 MED ORDER — KETOROLAC TROMETHAMINE 30 MG/ML IJ SOLN
30.0000 mg | Freq: Once | INTRAMUSCULAR | Status: DC | PRN
  Filled 2019-03-22: qty 1

## 2019-03-22 MED ORDER — FENTANYL CITRATE (PF) 100 MCG/2ML IJ SOLN
INTRAMUSCULAR | Status: DC | PRN
  Administered 2019-03-22: 50 ug via INTRAVENOUS
  Administered 2019-03-22 (×2): 25 ug via INTRAVENOUS

## 2019-03-22 MED ORDER — KETOROLAC TROMETHAMINE 30 MG/ML IJ SOLN
INTRAMUSCULAR | Status: AC
  Filled 2019-03-22: qty 1

## 2019-03-22 MED ORDER — CEFAZOLIN SODIUM-DEXTROSE 2-4 GM/100ML-% IV SOLN
INTRAVENOUS | Status: AC
  Filled 2019-03-22: qty 100

## 2019-03-22 MED ORDER — PHENAZOPYRIDINE HCL 200 MG PO TABS: 200.0000 mg | ORAL_TABLET | Freq: Three times a day (TID) | ORAL | 0 refills | Status: DC | PRN

## 2019-03-22 MED ORDER — CEPHALEXIN 500 MG PO CAPS: 500.0000 mg | ORAL_CAPSULE | Freq: Two times a day (BID) | ORAL | 0 refills | Status: AC

## 2019-03-22 MED ORDER — PROPOFOL 10 MG/ML IV BOLUS
INTRAVENOUS | Status: AC
  Filled 2019-03-22: qty 20

## 2019-03-22 MED ORDER — KETOROLAC TROMETHAMINE 30 MG/ML IJ SOLN
INTRAMUSCULAR | Status: DC | PRN
  Administered 2019-03-22: 30 mg via INTRAVENOUS

## 2019-03-22 MED ORDER — OXYBUTYNIN CHLORIDE 5 MG PO TABS: 5.0000 mg | ORAL_TABLET | Freq: Three times a day (TID) | ORAL | 1 refills | Status: DC | PRN

## 2019-03-22 MED ORDER — ONDANSETRON HCL 4 MG/2ML IJ SOLN
INTRAMUSCULAR | Status: AC
  Filled 2019-03-22: qty 2

## 2019-03-22 MED ORDER — FENTANYL CITRATE (PF) 100 MCG/2ML IJ SOLN
INTRAMUSCULAR | Status: AC
  Filled 2019-03-22: qty 2

## 2019-03-22 MED ORDER — KETOROLAC TROMETHAMINE 10 MG PO TABS: 10.0000 mg | ORAL_TABLET | Freq: Four times a day (QID) | ORAL | 0 refills | Status: DC | PRN

## 2019-03-22 MED ORDER — ONDANSETRON HCL 4 MG/2ML IJ SOLN
INTRAMUSCULAR | Status: DC | PRN
  Administered 2019-03-22: 4 mg via INTRAVENOUS

## 2019-03-22 MED ORDER — PROMETHAZINE HCL 25 MG/ML IJ SOLN
6.2500 mg | INTRAMUSCULAR | Status: DC | PRN
  Filled 2019-03-22: qty 1

## 2019-03-22 MED ORDER — LACTATED RINGERS IV SOLN
INTRAVENOUS | Status: DC
  Administered 2019-03-22: 50 mL/h via INTRAVENOUS
  Administered 2019-03-22: 14:00:00 via INTRAVENOUS
  Filled 2019-03-22: qty 1000

## 2019-03-22 MED ORDER — LIDOCAINE 2% (20 MG/ML) 5 ML SYRINGE
INTRAMUSCULAR | Status: AC
  Filled 2019-03-22: qty 5

## 2019-03-22 MED ORDER — DEXAMETHASONE SODIUM PHOSPHATE 10 MG/ML IJ SOLN
INTRAMUSCULAR | Status: AC
  Filled 2019-03-22: qty 1

## 2019-03-22 MED ORDER — LIDOCAINE HCL (CARDIAC) PF 100 MG/5ML IV SOSY
PREFILLED_SYRINGE | INTRAVENOUS | Status: DC | PRN
  Administered 2019-03-22: 50 mg via INTRAVENOUS

## 2019-03-22 MED FILL — OXYBUTYNIN CHLORIDE 5 MG TA: 5 | 10 days supply | Qty: 30 | Fill #0

## 2019-03-22 MED FILL — KETOROLAC 10 MG TABLET: 10 | 5 days supply | Qty: 20 | Fill #0

## 2019-03-22 MED FILL — CEPHALEXIN 500 MG CAPSULE: 500 | 3 days supply | Qty: 6 | Fill #0

## 2019-03-22 MED FILL — PHENAZOPYRIDINE 200 MG TAB: 200 | 10 days supply | Qty: 30 | Fill #0

## 2019-03-22 SURGICAL SUPPLY — 20 items
BAG DRAIN URO-CYSTO SKYTR STRL (DRAIN) ×2 IMPLANT
BASKET STONE 1.7 NGAGE (UROLOGICAL SUPPLIES) ×2 IMPLANT
CATH URET 5FR 28IN OPEN ENDED (CATHETERS) ×2 IMPLANT
CLOTH BEACON ORANGE TIMEOUT ST (SAFETY) ×2 IMPLANT
GLOVE BIO SURGEON STRL SZ7.5 (GLOVE) ×2 IMPLANT
GLOVE BIOGEL PI IND STRL 8.5 (GLOVE) ×1 IMPLANT
GLOVE BIOGEL PI INDICATOR 8.5 (GLOVE) ×1
GLOVE SURG SS PI 8.5 STRL IVOR (GLOVE) ×1
GLOVE SURG SS PI 8.5 STRL STRW (GLOVE) ×1 IMPLANT
GOWN STRL REUS W/TWL XL LVL3 (GOWN DISPOSABLE) ×4 IMPLANT
GUIDEWIRE ZIPWRE .038 STRAIGHT (WIRE) ×2 IMPLANT
IV NS IRRIG 3000ML ARTHROMATIC (IV SOLUTION) ×2 IMPLANT
KIT TURNOVER CYSTO (KITS) ×2 IMPLANT
MANIFOLD NEPTUNE II (INSTRUMENTS) ×2 IMPLANT
NS IRRIG 500ML POUR BTL (IV SOLUTION) ×2 IMPLANT
PACK CYSTO (CUSTOM PROCEDURE TRAY) ×2 IMPLANT
STENT URET 6FRX24 CONTOUR (STENTS) ×2 IMPLANT
SYR 10ML LL (SYRINGE) ×2 IMPLANT
TUBE CONNECTING 12X1/4 (SUCTIONS) ×2 IMPLANT
TUBING UROLOGY SET (TUBING) ×2 IMPLANT

## 2019-03-22 NOTE — Transfer of Care (Signed)
Immediate Anesthesia Transfer of Care Note  Patient: Erin Marquez  Procedure(s) Performed: CYSTOSCOPY/URETEROSCOPY/STENT PLACEMENT (Left Ureter)  Patient Location: PACU  Anesthesia Type:General  Level of Consciousness: awake, alert  and oriented  Airway & Oxygen Therapy: Patient Spontanous Breathing and Patient connected to nasal cannula oxygen  Post-op Assessment: Report given to RN and Post -op Vital signs reviewed and stable  Post vital signs: Reviewed and stable  Last Vitals:  Vitals Value Taken Time  BP 122/84 03/22/19 1318  Temp    Pulse 87 03/22/19 1322  Resp 12 03/22/19 1322  SpO2 93 % 03/22/19 1322  Vitals shown include unvalidated device data.  Last Pain:  Vitals:   03/22/19 1047  TempSrc: Oral  PainSc: 0-No pain      Patients Stated Pain Goal: 4 ([id number])  Complications: No apparent anesthesia complications

## 2019-03-22 NOTE — H&P (Signed)
Office Visit Report     03/15/2019   --------------------------------------------------------------------------------   Erin Marquez  MRN: [id number]  DOB: 1971/05/02, 48 year old Female  SSN:    PRIMARY CARE:  Alma Friendly, NP  REFERRING:    PROVIDER:  Ellison Hughs, M.D.  LOCATION:  [facility] [phone]     --------------------------------------------------------------------------------   CC/HPI: Left flank pain   The patient is a 48 year old female who was seen at an outside ER on 02/22/2019 and was found to have a 5 mm left UVJ stone with moderate hydronephrosis. She still having intermittent episodes of left-sided flank pain, but much less severe. She also reports mild nausea without vomiting. She denies dysuria or gross hematuria. She also denies fever/chills.     ALLERGIES: Bee stings CODEINE Peanut Oil    MEDICATIONS: Lisinopril 10 mg tablet  Allegra Allergy 180 mg tablet  Atorvastatin Calcium 40 mg tablet  Glipizide 10 mg tablet  Meloxicam 15 mg tablet  Metformin Er Gastric 500 mg tablet, er gastric retention 24 hr  Norethindrone Acetate 5 mg tablet  Omega 3-6-9 1,200 mg capsule  Vitamin B12 5,000 mcg tablet,disintegrating  Vitamin D3 125 mcg (5,000 unit) capsule     GU PSH: No GU PSH      PSH Notes: skin taken away from [place] after child birth  Wisdom teeth    NON-GU PSH: No Non-GU PSH    GU PMH: None   NON-GU PMH: Diabetes Type 2 Hypercholesterolemia Hypertension    FAMILY HISTORY: 1 Daughter - No Family History 1 son - No Family History Diabetes - Father Hypertension - Mother Hyperthyroidism - Father Kidney Stones - Father stroke - Mother   SOCIAL HISTORY: Marital Status: Married Preferred Language: English; Race: White Current Smoking Status: Patient has never smoked.   Tobacco Use Assessment Completed: Used Tobacco in last 30 days? Does not use smokeless tobacco. Drinks 2 drinks per month.  Does not use  drugs. Drinks 1 caffeinated drink per day. Has not had a blood transfusion.    REVIEW OF SYSTEMS:    GU Review Female:   Patient reports get up at night to urinate. Patient denies frequent urination, hard to postpone urination, burning /pain with urination, leakage of urine, stream starts and stops, trouble starting your stream, have to strain to urinate, and being pregnant.  Gastrointestinal (Upper):   Patient reports nausea. Patient denies vomiting and indigestion/ heartburn.  Gastrointestinal (Lower):   Patient reports diarrhea. Patient denies constipation.  Constitutional:   Patient denies fever, night sweats, weight loss, and fatigue.  Skin:   Patient denies skin rash/ lesion and itching.  Eyes:   Patient denies blurred vision and double vision.  Ears/ Nose/ Throat:   Patient denies sore throat and sinus problems.  Hematologic/Lymphatic:   Patient denies swollen glands and easy bruising.  Cardiovascular:   Patient denies leg swelling and chest pains.  Respiratory:   Patient denies cough and shortness of breath.  Endocrine:   Patient denies excessive thirst.  Musculoskeletal:   Patient reports back pain. Patient denies joint pain.  Neurological:   Patient denies headaches and dizziness.  Psychologic:   Patient denies depression and anxiety.   Notes: L side flank pain, test in ER showed Gallstones as well as kidney stones     VITAL SIGNS:      03/15/2019 09:59 AM  Weight 190 lb / 86.18 kg  Height 65 in / 165.1 cm  BP 110/77 mmHg  Heart Rate 89 /min  Temperature  97.8 F / 36.5 C  BMI 31.6 kg/m   MULTI-SYSTEM PHYSICAL EXAMINATION:    Constitutional: Well-nourished. No physical deformities. Normally developed. Good grooming.  Neck: Neck symmetrical, not swollen. Normal tracheal position.  Respiratory: No labored breathing, no use of accessory muscles.   Cardiovascular: Normal temperature, normal extremity pulses, no swelling, no varicosities.  Neurologic / Psychiatric: Oriented  to time, oriented to place, oriented to person. No depression, no anxiety, no agitation.  Gastrointestinal: No mass, no tenderness, no rigidity, non obese abdomen.  Eyes: Normal conjunctivae. Normal eyelids.  Musculoskeletal: Normal gait and station of head and neck.     PAST DATA REVIEWED:  Source Of History:  Patient  Notes:                     CLINICAL DATA: Left flank pain with nausea and vomiting     EXAM:  CT ABDOMEN AND PELVIS WITHOUT CONTRAST     TECHNIQUE:  Multidetector CT imaging of the abdomen and pelvis was performed  following the standard protocol without oral or IV contrast.     COMPARISON: None.     FINDINGS:  Lower chest: There is a 5 x 4 mm nodular opacity abutting the pleura  in the posterior aspect of the superior segment of the left lower  lobe. Lung bases otherwise are clear. There are foci of coronary  artery calcification.     Hepatobiliary: There is hepatic steatosis. No focal liver lesions  are evident on this noncontrast enhanced study. There is an apparent  Riedel's lobe on the right, an anatomic variant. There is a small  gallstone within the gallbladder. The gallbladder wall is not  appreciably thickened. There is no biliary duct dilatation.     Pancreas: There is no pancreatic mass or inflammatory focus.     Spleen: No splenic lesions are evident.     Adrenals/Urinary Tract: Adrenals bilaterally appear unremarkable.  The left kidney is edematous with perinephric stranding on the left.  There is slight perinephric stranding on the right. There is no  renal mass on either side. There is mild to moderate hydronephrosis  on the left. There is no hydronephrosis on the right. There is a 1  mm calculus in the lower pole left kidney. No calculus is evident on  the right. There is a calculus at the left ureterovesical junction  measuring 5 x 3 mm. No other ureteral calculi are evident. The  urinary bladder is midline with wall thickness within normal  limits  for degree of distention.     Stomach/Bowel: There is no appreciable bowel wall or mesenteric  thickening. There is no appreciable bowel obstruction. The terminal  ileum appears unremarkable. There is no free air or portal venous  air.     Vascular/Lymphatic: There is no abdominal aortic aneurysm. There is  minimal calcification at the origin of the left renal artery. No  adenopathy is evident in the abdomen or pelvis.     Reproductive: The uterus is retroverted. There is a cystic area in  the right ovary measuring 3.8 x 3.1 cm. No other pelvic mass  evident.     Other: Appendix appears unremarkable. No abscess or ascites is  evident in the abdomen or pelvis.     Musculoskeletal: There is degenerative change in the lumbar spine.  No blastic or lytic bone lesions. No intramuscular or abdominal wall  lesions evident.     IMPRESSION:  1. 5 x 3 mm calculus at the  left ureterovesical junction with mild  to moderate left hydronephrosis and perinephric stranding.  2. 1 mm calculus in the lower pole of the left kidney.  3. Hepatic steatosis.  4. Cholelithiasis.  5. Cystic area in the right ovary measuring 3.8 x 3.1 cm. Recommend  nonemergent pelvic ultrasound for further characterization.  6. Coronary artery calcification.        Electronically Signed  By: Lowella Grip III M.D.  On: 02/22/2019 09:36   PROCEDURES:         KUB - [id number]  A single view of the abdomen is obtained.      Patient confirmed No Neulasta OnPro Device.           Urinalysis Dipstick Dipstick Cont'd  Color: Yellow Bilirubin: Neg mg/dL  Appearance: Clear Ketones: Neg mg/dL  Specific Gravity: 1.025 Blood: Neg ery/uL  pH: <=5.0 Protein: Neg mg/dL  Glucose: Neg mg/dL Urobilinogen: 0.2 mg/dL    Nitrites: Neg    Leukocyte Esterase: Neg leu/uL    ASSESSMENT:      ICD-10 Details  1 GU:   Ureteral calculus - N20.1   2   Flank Pain - R10.84    PLAN:            Medications New Meds:  Tamsulosin Hcl 0.4 mg capsule 1 capsule PO Daily   #30  1 Refill(s)  Zofran 8 mg tablet 1 tablet PO Q 4 H PRN   #30  1 Refill(s)            Orders X-Rays: KUB          Schedule Return Visit/Planned Activity: 1 Week - Schedule Surgery          Document Letter(s):  Created for Patient: Clinical Summary         Notes:   -KUB today shows no definitive evidence of a distal ureteral calcification along the expected course of the left ureter, but that area is obscured by fecal material and bowel gas. Given that she continues to have intermittent episodes of left-sided flank pain as well as nausea, I will proceed with booking her for left ureteroscopy.   The risks, benefits and alternatives of cystoscopy with LEFT ureteroscopy, laser lithotripsy and ureteral stent placement was discussed the patient. Risks included, but are not limited to: bleeding, urinary tract infection, ureteral injury/avulsion, ureteral stricture formation, retained stone fragments, the possibility that multiple surgeries may be required to treat the stone(s), MI, stroke, PE and the inherent risks of general anesthesia. The patient voices understanding and wishes to proceed.  - We discussed criteria to return to clinic or proceed to the ER which include: Fever/chills, worsening pain, nausea/vomiting and/or persistent gross hematuria.

## 2019-03-22 NOTE — Discharge Instructions (Signed)

## 2019-03-22 NOTE — Op Note (Signed)
Operative Note  Preoperative diagnosis:  1.  5 mm left UVJ calculus 2.  1 mm left renal stone  Postoperative diagnosis: 1.  1 mm left renal stone  Procedure(s): 1.  Cystoscopy with left ureteroscopy, basket stone extraction and left JJ stent placement 2.  Left retrograde pyelogram with intraoperative interpretation of fluoroscopic imaging  Surgeon: Ellison Hughs, MD  Assistants:  None  Anesthesia:  General  Complications:  None  EBL: Less than 5 mL  Specimens: 1.  Left renal stone  Drains/Catheters: 1.  Left 6 French, 24 cm JJ stent with tether  Intraoperative findings:   1. There was no evidence of an obstructing 5 mm distal stone seen on ureteroscopy 2. 1 mm left renal stone was extracted atraumatically 3. Solitary left collecting system with no filling defects or dilation involving the left ureter or left renal pelvis seen on retrograde pyelogram   Indication:  Erin Marquez is a 48 y.o. female who was diagnosed with a 5 mm left UVJ stone along with a 1 mm left renal stone seen on CT from 02/22/2019.  Since that time, the patient has had intermittent episodes of left-sided flank pain along with persistent nausea.  She has been consented for the above procedures, voices understanding and wishes to proceed. Description of procedure:  After informed consent was obtained, the patient was brought to the operating room and general LMA anesthesia was administered. The patient was then placed in the dorsolithotomy position and prepped and draped in the usual sterile fashion. A timeout was performed. A 23 French rigid cystoscope was then inserted into the urethral meatus and advanced into the bladder under direct vision. A complete bladder survey revealed no intravesical pathology.  A 5 French ureteral catheter was then inserted into the left ureteral orifice and a retrograde pyelogram was obtained, with the findings listed above.  A Glidewire was then used to intubate the lumen  of the ureteral catheter and was advanced up to the left renal pelvis, under fluoroscopic guidance.  The catheter was then removed, leaving the wire in place.  A semirigid ureteroscope was then inserted into the left ureteral orifice and advanced up to the level of the left iliac vessels, identifying no evidence of an obstructing stone.  The semirigid ureteroscope was then exchanged for a flexible ureteroscope, which was advanced along the remainder of the proximal ureter and into the left renal pelvis.  Again, no obstructing ureteral stone was identified.  A full inspection of her left renal pelvis revealed the 1 mm left lower pole stone, which was grasped with an engage basket and removed atraumatically.  A 6 French, 24 cm JJ stent was then advanced over the wire and into good position within the left collecting system, confirming placement via fluoroscopy.  The tether the stent was left intact and tucked into the vaginal vault.  The patient's bladder was drained.  She tolerated the procedure well and was transferred to the postanesthesia in stable condition.  Plan: The patient has been given instructions to remove her stent at 7 AM on 03/25/2019.

## 2019-03-22 NOTE — Anesthesia Procedure Notes (Signed)
Procedure Name: LMA Insertion Date/Time: 03/22/2019 12:35 PM Performed by: Bufford Spikes, CRNA Pre-anesthesia Checklist: Patient identified, Emergency Drugs available, Suction available and Patient being monitored Patient Re-evaluated:Patient Re-evaluated prior to induction Oxygen Delivery Method: Circle system utilized Preoxygenation: Pre-oxygenation with 100% oxygen Induction Type: IV induction Ventilation: Mask ventilation without difficulty LMA: LMA inserted LMA Size: 4.0 Number of attempts: 1 Airway Equipment and Method: Bite block Placement Confirmation: positive ETCO2 Tube secured with: Tape Dental Injury: Teeth and Oropharynx as per pre-operative assessment

## 2019-03-23 NOTE — Anesthesia Postprocedure Evaluation (Signed)
Anesthesia Post Note  Patient: Erin Marquez  Procedure(s) Performed: CYSTOSCOPY/URETEROSCOPY/STENT PLACEMENT/ BASKET STONE EXTRACTION/ LEFT RETROGRADE PYELOGRAM (Left Ureter)     Patient location during evaluation: PACU Anesthesia Type: General Level of consciousness: awake and alert Pain management: pain level controlled Vital Signs Assessment: post-procedure vital signs reviewed and stable Respiratory status: spontaneous breathing, nonlabored ventilation, respiratory function stable and patient connected to nasal cannula oxygen Cardiovascular status: blood pressure returned to baseline and stable Postop Assessment: no apparent nausea or vomiting Anesthetic complications: no    Last Vitals:  Vitals:   03/22/19 1400 03/22/19 1441  BP: 121/72 (!) 141/81  Pulse: 75 74  Resp: 13 16  Temp:  (!) 36.4 C  SpO2: 98% 100%    Last Pain:  Vitals:   03/22/19 1441  TempSrc:   PainSc: 0-No pain                 Ryan P Ellender

## 2019-03-25 ENCOUNTER — Encounter (HOSPITAL_BASED_OUTPATIENT_CLINIC_OR_DEPARTMENT_OTHER): Payer: Self-pay | Admitting: Urology

## 2019-04-01 DIAGNOSIS — E118 Type 2 diabetes mellitus with unspecified complications: Secondary | ICD-10-CM

## 2019-04-01 MED ORDER — METFORMIN HCL ER 500 MG PO TB24: 1000.0000 mg | ORAL_TABLET | Freq: Every day | ORAL | 1 refills | Status: DC

## 2019-04-01 MED FILL — ATORVASTATIN 40 MG TABLET: 40 | 90 days supply | Qty: 90 | Fill #3

## 2019-04-02 MED FILL — METFORMIN HCL ER 500 MG TB2: 500 | 90 days supply | Qty: 180 | Fill #0

## 2019-04-17 ENCOUNTER — Other Ambulatory Visit: Payer: Self-pay | Admitting: Primary Care

## 2019-04-17 DIAGNOSIS — E118 Type 2 diabetes mellitus with unspecified complications: Secondary | ICD-10-CM

## 2019-04-23 ENCOUNTER — Other Ambulatory Visit: Payer: 59

## 2019-06-18 MED FILL — LISINOPRIL 10 MG TABS: 10 | 90 days supply | Qty: 90 | Fill #2

## 2019-06-18 MED FILL — glipiZIDE 10 MG TABS: 10 | 90 days supply | Qty: 180 | Fill #2

## 2019-06-18 MED FILL — metFORMIN HCL ER 500 MG TB2: 500 | 90 days supply | Qty: 180 | Fill #1

## 2019-06-21 ENCOUNTER — Other Ambulatory Visit: Payer: Self-pay | Admitting: Primary Care

## 2019-06-21 DIAGNOSIS — E782 Mixed hyperlipidemia: Secondary | ICD-10-CM

## 2019-06-21 DIAGNOSIS — R7989 Other specified abnormal findings of blood chemistry: Secondary | ICD-10-CM

## 2019-06-21 DIAGNOSIS — E1159 Type 2 diabetes mellitus with other circulatory complications: Secondary | ICD-10-CM

## 2019-06-21 DIAGNOSIS — E1169 Type 2 diabetes mellitus with other specified complication: Secondary | ICD-10-CM

## 2019-06-21 DIAGNOSIS — I152 Hypertension secondary to endocrine disorders: Secondary | ICD-10-CM

## 2019-06-21 DIAGNOSIS — E559 Vitamin D deficiency, unspecified: Secondary | ICD-10-CM

## 2019-06-21 DIAGNOSIS — E118 Type 2 diabetes mellitus with unspecified complications: Secondary | ICD-10-CM

## 2019-06-28 ENCOUNTER — Other Ambulatory Visit: Payer: Self-pay | Admitting: Primary Care

## 2019-06-28 DIAGNOSIS — E782 Mixed hyperlipidemia: Secondary | ICD-10-CM

## 2019-06-28 DIAGNOSIS — E1169 Type 2 diabetes mellitus with other specified complication: Secondary | ICD-10-CM

## 2019-06-28 MED ORDER — ATORVASTATIN CALCIUM 40 MG PO TABS: 40.0000 mg | ORAL_TABLET | Freq: Every day | ORAL | 3 refills | Status: DC

## 2019-06-28 MED FILL — ATORVASTATIN 40 MG TABLET: 40 | 90 days supply | Qty: 90 | Fill #0

## 2019-07-03 MED FILL — FREESTYLE LITE TEST STRIP: 75 days supply | Qty: 300 | Fill #0

## 2019-07-05 ENCOUNTER — Other Ambulatory Visit (INDEPENDENT_AMBULATORY_CARE_PROVIDER_SITE_OTHER): Payer: 59

## 2019-07-05 ENCOUNTER — Other Ambulatory Visit: Payer: Self-pay

## 2019-07-05 DIAGNOSIS — E118 Type 2 diabetes mellitus with unspecified complications: Secondary | ICD-10-CM

## 2019-07-05 DIAGNOSIS — E1159 Type 2 diabetes mellitus with other circulatory complications: Secondary | ICD-10-CM | POA: Diagnosis not present

## 2019-07-05 DIAGNOSIS — E559 Vitamin D deficiency, unspecified: Secondary | ICD-10-CM

## 2019-07-05 DIAGNOSIS — I10 Essential (primary) hypertension: Secondary | ICD-10-CM | POA: Diagnosis not present

## 2019-07-05 DIAGNOSIS — E782 Mixed hyperlipidemia: Secondary | ICD-10-CM | POA: Diagnosis not present

## 2019-07-05 DIAGNOSIS — E1169 Type 2 diabetes mellitus with other specified complication: Secondary | ICD-10-CM

## 2019-07-05 DIAGNOSIS — I152 Hypertension secondary to endocrine disorders: Secondary | ICD-10-CM

## 2019-07-05 DIAGNOSIS — R7989 Other specified abnormal findings of blood chemistry: Secondary | ICD-10-CM | POA: Diagnosis not present

## 2019-07-05 LAB — COMPREHENSIVE METABOLIC PANEL
ALT: 24 U/L (ref 0–35)
AST: 20 U/L (ref 0–37)
Albumin: 4.4 g/dL (ref 3.5–5.2)
Alkaline Phosphatase: 33 U/L — ABNORMAL LOW (ref 39–117)
BUN: 24 mg/dL — ABNORMAL HIGH (ref 6–23)
CO2: 25 mEq/L (ref 19–32)
Calcium: 9.4 mg/dL (ref 8.4–10.5)
Chloride: 103 mEq/L (ref 96–112)
Creatinine, Ser: 1.19 mg/dL (ref 0.40–1.20)
GFR: 48.75 mL/min — ABNORMAL LOW (ref >60.00)
Glucose, Bld: 208 mg/dL — ABNORMAL HIGH (ref 70–99)
Potassium: 4.2 mEq/L (ref 3.5–5.1)
Sodium: 135 mEq/L (ref 135–145)
Total Bilirubin: 1.4 mg/dL — ABNORMAL HIGH (ref 0.2–1.2)
Total Protein: 7.8 g/dL (ref 6.0–8.3)

## 2019-07-05 LAB — LIPID PANEL
Cholesterol: 143 mg/dL (ref 0–200)
HDL: 33.7 mg/dL — ABNORMAL LOW (ref >39.00)
LDL Cholesterol: 80 mg/dL (ref 0–99)
NonHDL: 109.79
Total CHOL/HDL Ratio: 4
Triglycerides: 150 mg/dL — ABNORMAL HIGH (ref 0.0–149.0)
VLDL: 30 mg/dL (ref 0.0–40.0)

## 2019-07-05 LAB — VITAMIN D 25 HYDROXY (VIT D DEFICIENCY, FRACTURES): VITD: 89.6 ng/mL (ref 30.00–100.00)

## 2019-07-05 LAB — CBC
HCT: 37.8 % (ref 36.0–46.0)
Hemoglobin: 12.9 g/dL (ref 12.0–15.0)
MCHC: 34.2 g/dL (ref 30.0–36.0)
MCV: 92.2 fl (ref 78.0–100.0)
Platelets: 217 10*3/uL (ref 150.0–400.0)
RBC: 4.1 Mil/uL (ref 3.87–5.11)
RDW: 12.5 % (ref 11.5–15.5)
WBC: 8.1 10*3/uL (ref 4.0–10.5)

## 2019-07-05 LAB — TSH: TSH: 2.56 u[IU]/mL (ref 0.35–4.50)

## 2019-07-06 LAB — HEMOGLOBIN A1C
Hgb A1c MFr Bld: 8.6 % of total Hgb — ABNORMAL HIGH (ref <5.7)
Mean Plasma Glucose: 200 (calc)
eAG (mmol/L): 11.1 (calc)

## 2019-07-09 ENCOUNTER — Encounter: Payer: 59 | Admitting: Primary Care

## 2019-07-10 ENCOUNTER — Other Ambulatory Visit: Payer: Self-pay

## 2019-07-10 ENCOUNTER — Ambulatory Visit (INDEPENDENT_AMBULATORY_CARE_PROVIDER_SITE_OTHER): Payer: 59 | Admitting: Primary Care

## 2019-07-10 ENCOUNTER — Encounter: Payer: Self-pay | Admitting: Primary Care

## 2019-07-10 VITALS — BP 124/82 | HR 78 | Temp 97.1°F | Ht 64.0 in | Wt 196.0 lb

## 2019-07-10 DIAGNOSIS — E782 Mixed hyperlipidemia: Secondary | ICD-10-CM

## 2019-07-10 DIAGNOSIS — E282 Polycystic ovarian syndrome: Secondary | ICD-10-CM

## 2019-07-10 DIAGNOSIS — Z0000 Encounter for general adult medical examination without abnormal findings: Secondary | ICD-10-CM | POA: Insufficient documentation

## 2019-07-10 DIAGNOSIS — E1169 Type 2 diabetes mellitus with other specified complication: Secondary | ICD-10-CM | POA: Diagnosis not present

## 2019-07-10 DIAGNOSIS — R7989 Other specified abnormal findings of blood chemistry: Secondary | ICD-10-CM

## 2019-07-10 DIAGNOSIS — F339 Major depressive disorder, recurrent, unspecified: Secondary | ICD-10-CM

## 2019-07-10 DIAGNOSIS — E1159 Type 2 diabetes mellitus with other circulatory complications: Secondary | ICD-10-CM | POA: Diagnosis not present

## 2019-07-10 DIAGNOSIS — N289 Disorder of kidney and ureter, unspecified: Secondary | ICD-10-CM | POA: Insufficient documentation

## 2019-07-10 DIAGNOSIS — E559 Vitamin D deficiency, unspecified: Secondary | ICD-10-CM | POA: Diagnosis not present

## 2019-07-10 DIAGNOSIS — Z1231 Encounter for screening mammogram for malignant neoplasm of breast: Secondary | ICD-10-CM

## 2019-07-10 DIAGNOSIS — I152 Hypertension secondary to endocrine disorders: Secondary | ICD-10-CM

## 2019-07-10 DIAGNOSIS — Z23 Encounter for immunization: Secondary | ICD-10-CM | POA: Diagnosis not present

## 2019-07-10 DIAGNOSIS — Z0001 Encounter for general adult medical examination with abnormal findings: Secondary | ICD-10-CM | POA: Insufficient documentation

## 2019-07-10 DIAGNOSIS — E118 Type 2 diabetes mellitus with unspecified complications: Secondary | ICD-10-CM

## 2019-07-10 DIAGNOSIS — J3089 Other allergic rhinitis: Secondary | ICD-10-CM

## 2019-07-10 DIAGNOSIS — I10 Essential (primary) hypertension: Secondary | ICD-10-CM

## 2019-07-10 DIAGNOSIS — H9203 Otalgia, bilateral: Secondary | ICD-10-CM

## 2019-07-10 DIAGNOSIS — R748 Abnormal levels of other serum enzymes: Secondary | ICD-10-CM

## 2019-07-10 NOTE — Assessment & Plan Note (Signed)
Stable in the office today, continue lisinopril 10 mg. CMP reviewed and renal function declined compared to October 2020.  She is not taking NSAID's often, also endorses plenty of water. Will repeat renal function in 4 weeks.

## 2019-07-10 NOTE — Assessment & Plan Note (Signed)
Occurring today. Exam today without infection. Discussed use of nasal sprays.  She will update.

## 2019-07-10 NOTE — Assessment & Plan Note (Signed)
Normal liver enzymes on recent labs. Continue to monitor.

## 2019-07-10 NOTE — Progress Notes (Signed)
Subjective:    Patient ID: Erin Marquez, female    DOB: 1970/12/20, 49 y.o.   MRN: [id number]  HPI  This visit occurred during the SARS-CoV-2 public health emergency.  Safety protocols were in place, including screening questions prior to the visit, additional usage of staff PPE, and extensive cleaning of exam room while observing appropriate contact time as indicated for disinfecting solutions.   Erin Marquez is a 49 year old female who presents today for complete physical.  Immunizations: -Tetanus: Completed in 2016 -Influenza: Completed this season  -Pneumonia: Completed last in 2016  Diet: She endorses a fair diet. Has been more stressed. Makes sure she is eating vegetables and salads.  Exercise: She is walking twice daily.    Eye exam: Completed in June 2020 Dental exam: Completes semi-annually   Pap Smear: Completed in 2019 Mammogram: Completed in 2020 Colonoscopy: Never completed, declines this year, would like to defer to next year.  BP Readings from Last 3 Encounters:  07/10/19 124/82  03/22/19 (!) 141/81  02/22/19 128/88   Wt Readings from Last 3 Encounters:  07/10/19 196 lb (88.9 kg)  03/22/19 194 lb (88 kg)  02/22/19 185 lb (83.9 kg)   See attached glucose log sheets under media.   Review of Systems  Constitutional: Negative for unexpected weight change.  HENT: Negative for rhinorrhea.        Bilateral ear pain for the last several days  Respiratory: Negative for cough and shortness of breath.   Cardiovascular: Negative for chest pain.  Gastrointestinal: Negative for constipation and diarrhea.  Genitourinary: Negative for difficulty urinating.  Musculoskeletal: Negative for arthralgias.  Skin: Negative for rash.  Allergic/Immunologic: Positive for environmental allergies.  Neurological: Negative for dizziness, numbness and headaches.  Psychiatric/Behavioral:       Situational anxiety and feeling depressed with her mother in law who is living with her.         Past Medical History:  Diagnosis Date  . Diabetes mellitus without complication ([place])    type 2  . Hyperlipidemia   . Hypertension      Social History   Socioeconomic History  . Marital status: Married    Spouse name: Not on file  . Number of children: Not on file  . Years of education: Not on file  . Highest education level: Not on file  Occupational History  . Not on file  Tobacco Use  . Smoking status: Never Smoker  . Smokeless tobacco: Never Used  Substance and Sexual Activity  . Alcohol use: Yes    Alcohol/week: 1.0 standard drinks    Types: 1 Standard drinks or equivalent per week    Comment: Monthly  . Drug use: No  . Sexual activity: Yes    Birth control/protection: None, Surgical    Comment: vasectomy  Other Topics Concern  . Not on file  Social History Narrative  . Not on file   Social Determinants of Health   Financial Resource Strain:   . Difficulty of Paying Living Expenses: Not on file  Food Insecurity:   . Worried About Charity fundraiser in the Last Year: Not on file  . Ran Out of Food in the Last Year: Not on file  Transportation Needs:   . Lack of Transportation (Medical): Not on file  . Lack of Transportation (Non-Medical): Not on file  Physical Activity:   . Days of Exercise per Week: Not on file  . Minutes of Exercise per Session: Not on file  Stress:   . Feeling of Stress : Not on file  Social Connections:   . Frequency of Communication with Friends and Family: Not on file  . Frequency of Social Gatherings with Friends and Family: Not on file  . Attends Religious Services: Not on file  . Active Member of Clubs or Organizations: Not on file  . Attends Archivist Meetings: Not on file  . Marital Status: Not on file  Intimate Partner Violence:   . Fear of Current or Ex-Partner: Not on file  . Emotionally Abused: Not on file  . Physically Abused: Not on file  . Sexually Abused: Not on file    Past Surgical History:   Procedure Laterality Date  . CYSTOSCOPY/URETEROSCOPY/HOLMIUM LASER/STENT PLACEMENT Left 03/22/2019   Procedure: CYSTOSCOPY/URETEROSCOPY/STENT PLACEMENT/ BASKET STONE EXTRACTION/ LEFT RETROGRADE PYELOGRAM;  Surgeon: Ceasar Mons, MD;  Location: [facility];  Service: Urology;  Laterality: Left;  . HYMENECTOMY N/A 12/05/2018   Procedure: Vaginoplasty;  Surgeon: [name], MD;  Location: [place];  Service: Gynecology;  Laterality: N/A;  Please have four 2-0 Vicryl on a CT-1 and 48ms of 0.25% Marcaine  . VAGINA SURGERY  2010   extra skin removed  . WISDOM TOOTH EXTRACTION      Family History  Problem Relation Age of Onset  . Hypertension Mother   . Stroke Mother   . Diabetes Father   . Hyperthyroidism Father   . Prostate cancer Father     Allergies  Allergen Reactions  . Codeine Nausea Only  . Peanut Oil     Slurred speech    Current Outpatient Medications on File Prior to Visit  Medication Sig Dispense Refill  . acetaminophen (TYLENOL) 650 MG CR tablet Take 1 tablet by mouth at bedtime. And as needed    . atorvastatin (LIPITOR) 40 MG tablet Take 1 tablet (40 mg total) by mouth daily. For cholesterol. 90 tablet 3  . blood glucose meter kit and supplies KIT Dispense based on patient and insurance preference. Use up to four times daily as directed. (FOR ICD-9 250.00, 250.01). 1 each 0  . Blood Glucose Monitoring Suppl (FREESTYLE LITE) DEVI     . Cholecalciferol (VITAMIN D3) 5000 units CAPS Take 1 capsule by mouth daily.    . fexofenadine (ALLEGRA) 180 MG tablet Take 180 mg by mouth daily.    .Marland KitchenglipiZIDE (GLUCOTROL) 10 MG tablet TAKE 1 TABLET BY MOUTH TWO TIMES DAILY FOR DIABETES 180 tablet 2  . glucose blood (FREESTYLE LITE) test strip Use as instructed to test blood sugar up to 4 times daily 300 each 2  . Lancets (FREESTYLE) lancets     . lisinopril (ZESTRIL) 10 MG tablet Take 1 tablet (10 mg total) by mouth daily. For blood  pressure. 90 tablet 3  . meloxicam (MOBIC) 15 MG tablet Take 15 mg by mouth daily.    . metFORMIN (GLUCOPHAGE-XR) 500 MG 24 hr tablet Take 2 tablets (1,000 mg total) by mouth daily with breakfast. For diabetes. 180 tablet 1  . naproxen (NAPROSYN) 500 MG tablet Take 1 tablet (500 mg total) by mouth 2 (two) times daily. 30 tablet 0  . norethindrone (AYGESTIN) 5 MG tablet Take 1 tablet (5 mg total) by mouth daily. To initiate a cycle 30 tablet 1  . Omega-3 Fatty Acids (FISH OIL) 1200 MG CAPS 2 cap twice daily (Patient taking differently: 1 in am 2 in pm)    . vitamin B-12 (CYANOCOBALAMIN) 100 MCG tablet  Take 1 capsule by mouth daily.     No current facility-administered medications on file prior to visit.    BP 124/82   Pulse 78   Temp (!) 97.1 F (36.2 C) (Temporal)   Ht '5\' 4"'  (1.626 m)   Wt 196 lb (88.9 kg)   LMP 06/29/2019   SpO2 98%   BMI 33.64 kg/m    Objective:   Physical Exam  Constitutional: She is oriented to person, place, and time. She appears well-nourished.  HENT:  Right Ear: Tympanic membrane and ear canal normal.  Left Ear: Tympanic membrane and ear canal normal.  Mouth/Throat: Oropharynx is clear and moist.  Eyes: Pupils are equal, round, and reactive to light. EOM are normal.  Cardiovascular: Normal rate and regular rhythm.  Respiratory: Effort normal and breath sounds normal.  GI: Soft. Bowel sounds are normal. There is no abdominal tenderness.  Musculoskeletal:        General: Normal range of motion.     Cervical back: Neck supple.  Neurological: She is alert and oriented to person, place, and time. No cranial nerve deficit.  Reflex Scores:      Patellar reflexes are 2+ on the right side and 2+ on the left side. Skin: Skin is warm and dry.  Psychiatric: She has a normal mood and affect.           Assessment & Plan:

## 2019-07-10 NOTE — Assessment & Plan Note (Signed)
A1C of 8.6 on recent labs.  She is motivated to work on her diet for weight loss.  Also discussed other options for treatment including GLP1 and DDP4 family, she kindly declines. Referral placed to endocrinology per patient request.   Foot exam UTD. Eye exam UTD. Pneumonia vaccination provided today. Managed on statin and ACE-I

## 2019-07-10 NOTE — Assessment & Plan Note (Signed)
Overall stable. Seasonal.

## 2019-07-10 NOTE — Assessment & Plan Note (Signed)
Overall stable, manages well on her own. She will update with any changes.

## 2019-07-10 NOTE — Assessment & Plan Note (Signed)
GFR of 48, creatinine of 1.19 which is a decrease from October 2020.  Discussed to continue to avoid NSAID use, be sure to hydrate well with water. She is managed on ACE-I.   Repeat labs in 4 weeks.

## 2019-07-10 NOTE — Patient Instructions (Signed)
Continue exercising. You should be getting 150 minutes of moderate intensity exercise weekly.  Continue to work on a healthy diet. Ensure you are consuming 64 ounces of water daily.  Call the breast center for the mammogram.   You will be contacted regarding your referral to endocrinology.  Please let us know if you have not been contacted within two weeks.   Schedule a lab only appointment for 4 weeks for repeat kidney check.  It was a pleasure to see you today!   Preventive Care 5-60 Years Old, Female Preventive care refers to visits with your health care provider and lifestyle choices that can promote health and wellness. This includes:  A yearly physical exam. This may also be called an annual well check.  Regular dental visits and eye exams.  Immunizations.  Screening for certain conditions.  Healthy lifestyle choices, such as eating a healthy diet, getting regular exercise, not using drugs or products that contain nicotine and tobacco, and limiting alcohol use. What can I expect for my preventive care visit? Physical exam Your health care provider will check your:  Height and weight. This may be used to calculate body mass index (BMI), which tells if you are at a healthy weight.  Heart rate and blood pressure.  Skin for abnormal spots. Counseling Your health care provider may ask you questions about your:  Alcohol, tobacco, and drug use.  Emotional well-being.  Home and relationship well-being.  Sexual activity.  Eating habits.  Work and work Statistician.  Method of birth control.  Menstrual cycle.  Pregnancy history. What immunizations do I need?  Influenza (flu) vaccine  This is recommended every year. Tetanus, diphtheria, and pertussis (Tdap) vaccine  You may need a Td booster every 10 years. Varicella (chickenpox) vaccine  You may need this if you have not been vaccinated. Zoster (shingles) vaccine  You may need this after age  33. Measles, mumps, and rubella (MMR) vaccine  You may need at least one dose of MMR if you were born in 1957 or later. You may also need a second dose. Pneumococcal conjugate (PCV13) vaccine  You may need this if you have certain conditions and were not previously vaccinated. Pneumococcal polysaccharide (PPSV23) vaccine  You may need one or two doses if you smoke cigarettes or if you have certain conditions. Meningococcal conjugate (MenACWY) vaccine  You may need this if you have certain conditions. Hepatitis A vaccine  You may need this if you have certain conditions or if you travel or work in places where you may be exposed to hepatitis A. Hepatitis B vaccine  You may need this if you have certain conditions or if you travel or work in places where you may be exposed to hepatitis B. Haemophilus influenzae type b (Hib) vaccine  You may need this if you have certain conditions. Human papillomavirus (HPV) vaccine  If recommended by your health care provider, you may need three doses over 6 months. You may receive vaccines as individual doses or as more than one vaccine together in one shot (combination vaccines). Talk with your health care provider about the risks and benefits of combination vaccines. What tests do I need? Blood tests  Lipid and cholesterol levels. These may be checked every 5 years, or more frequently if you are over 51 years old.  Hepatitis C test.  Hepatitis B test. Screening  Lung cancer screening. You may have this screening every year starting at age 58 if you have a 30-pack-year history of smoking  and currently smoke or have quit within the past 15 years.  Colorectal cancer screening. All adults should have this screening starting at age 93 and continuing until age 28. Your health care provider may recommend screening at age 48 if you are at increased risk. You will have tests every 1-10 years, depending on your results and the type of screening  test.  Diabetes screening. This is done by checking your blood sugar (glucose) after you have not eaten for a while (fasting). You may have this done every 1-3 years.  Mammogram. This may be done every 1-2 years. Talk with your health care provider about when you should start having regular mammograms. This may depend on whether you have a family history of breast cancer.  BRCA-related cancer screening. This may be done if you have a family history of breast, ovarian, tubal, or peritoneal cancers.  Pelvic exam and Pap test. This may be done every 3 years starting at age 3. Starting at age 68, this may be done every 5 years if you have a Pap test in combination with an HPV test. Other tests  Sexually transmitted disease (STD) testing.  Bone density scan. This is done to screen for osteoporosis. You may have this scan if you are at high risk for osteoporosis. Follow these instructions at home: Eating and drinking  Eat a diet that includes fresh fruits and vegetables, whole grains, lean protein, and low-fat dairy.  Take vitamin and mineral supplements as recommended by your health care provider.  Do not drink alcohol if: ? Your health care provider tells you not to drink. ? You are pregnant, may be pregnant, or are planning to become pregnant.  If you drink alcohol: ? Limit how much you have to 0-1 drink a day. ? Be aware of how much alcohol is in your drink. In the [place] one drink equals one 12 oz bottle of beer (355 mL), one 5 oz glass of wine (148 mL), or one 1 oz glass of hard liquor (44 mL). Lifestyle  Take daily care of your teeth and gums.  Stay active. Exercise for at least 30 minutes on 5 or more days each week.  Do not use any products that contain nicotine or tobacco, such as cigarettes, e-cigarettes, and chewing tobacco. If you need help quitting, ask your health care provider.  If you are sexually active, practice safe sex. Use a condom or other form of birth control  (contraception) in order to prevent pregnancy and STIs (sexually transmitted infections).  If told by your health care provider, take low-dose aspirin daily starting at age 33. What's next?  Visit your health care provider once a year for a well check visit.  Ask your health care provider how often you should have your eyes and teeth checked.  Stay up to date on all vaccines. This information is not intended to replace advice given to you by your health care provider. Make sure you discuss any questions you have with your health care provider. Document Revised: 01/04/2018 Document Reviewed: 01/04/2018 Elsevier Patient Education  2020 [employer].

## 2019-07-10 NOTE — Assessment & Plan Note (Signed)
Tetanus and influenza UTD, pneumovax due and provided. Pap smear UTD, follows with GYN. Mammogram due and pending. Colonoscopy due, she kindly declines and would like to defer until next year.  Encouraged a healthy diet and regular exercise. Exam today stable. Labs reviewed.

## 2019-07-10 NOTE — Assessment & Plan Note (Signed)
LDL at goal on statin therapy. Continue atorvastatin.

## 2019-07-10 NOTE — Assessment & Plan Note (Signed)
Recent vitamin level stable. Continue supplemental vitamin D.

## 2019-07-10 NOTE — Addendum Note (Signed)
Addended by: Jacqualin Combes on: 07/10/2019 12:43 PM   Modules accepted: Orders

## 2019-07-10 NOTE — Assessment & Plan Note (Signed)
Recent TSH unremarkable.

## 2019-07-10 NOTE — Assessment & Plan Note (Signed)
Following with GYN

## 2019-07-18 NOTE — Telephone Encounter (Signed)
Erin Marquez, see my chart message. Referred to [facility] endocrinology per patient request.

## 2019-07-31 ENCOUNTER — Ambulatory Visit: Payer: 59 | Admitting: Family Medicine

## 2019-07-31 ENCOUNTER — Other Ambulatory Visit: Payer: Self-pay

## 2019-07-31 ENCOUNTER — Ambulatory Visit: Payer: 59 | Admitting: Primary Care

## 2019-07-31 DIAGNOSIS — H9209 Otalgia, unspecified ear: Secondary | ICD-10-CM | POA: Diagnosis not present

## 2019-07-31 NOTE — Assessment & Plan Note (Signed)
Acute to left ear, not currently using Flonase. Obvious fluid buildup. Resume Flonase and continue throughout the season. Continue Allegra.   She will update. Consider ENT if no improvement.

## 2019-07-31 NOTE — Progress Notes (Signed)
Subjective:    Patient ID: Erin Marquez, female    DOB: 1970-07-11, 49 y.o.   MRN: [id number]  HPI  This visit occurred during the SARS-CoV-2 public health emergency.  Safety protocols were in place, including screening questions prior to the visit, additional usage of staff PPE, and extensive cleaning of exam room while observing appropriate contact time as indicated for disinfecting solutions.   Ms. Erin Marquez is a 49 year old female with a history of hypertension, type 2 diabetes, seasonal allergies, otalgia who presents today with a chief complaint of otalgia.  Her pain is located to the left ear. She was evaluated a few weeks ago for her CPE, reported bilateral ear pain. She did start the Flonase as recommended with improvement in pain so she stopped. No recent use of Flonase. She does take Allegra daily. Her pain returned about five days ago. She did get a new martress, sleeps on the left side, is not sure if this is contributing. She did does have PND and rhinorrhea today.   Review of Systems  Constitutional: Negative for fever.  HENT: Positive for ear pain and postnasal drip. Negative for congestion and sinus pressure.   Respiratory: Negative for cough.   Allergic/Immunologic: Positive for environmental allergies.       Past Medical History:  Diagnosis Date  . Diabetes mellitus without complication ([place])    type 2  . Hyperlipidemia   . Hypertension      Social History   Socioeconomic History  . Marital status: Married    Spouse name: Not on file  . Number of children: Not on file  . Years of education: Not on file  . Highest education level: Not on file  Occupational History  . Not on file  Tobacco Use  . Smoking status: Never Smoker  . Smokeless tobacco: Never Used  Substance and Sexual Activity  . Alcohol use: Yes    Alcohol/week: 1.0 standard drinks    Types: 1 Standard drinks or equivalent per week    Comment: Monthly  . Drug use: No  . Sexual activity: Yes   Birth control/protection: None, Surgical    Comment: vasectomy  Other Topics Concern  . Not on file  Social History Narrative  . Not on file   Social Determinants of Health   Financial Resource Strain:   . Difficulty of Paying Living Expenses:   Food Insecurity:   . Worried About Charity fundraiser in the Last Year:   . Arboriculturist in the Last Year:   Transportation Needs:   . Film/video editor (Medical):   Marland Kitchen Lack of Transportation (Non-Medical):   Physical Activity:   . Days of Exercise per Week:   . Minutes of Exercise per Session:   Stress:   . Feeling of Stress :   Social Connections:   . Frequency of Communication with Friends and Family:   . Frequency of Social Gatherings with Friends and Family:   . Attends Religious Services:   . Active Member of Clubs or Organizations:   . Attends Archivist Meetings:   Marland Kitchen Marital Status:   Intimate Partner Violence:   . Fear of Current or Ex-Partner:   . Emotionally Abused:   Marland Kitchen Physically Abused:   . Sexually Abused:     Past Surgical History:  Procedure Laterality Date  . CYSTOSCOPY/URETEROSCOPY/HOLMIUM LASER/STENT PLACEMENT Left 03/22/2019   Procedure: CYSTOSCOPY/URETEROSCOPY/STENT PLACEMENT/ BASKET STONE EXTRACTION/ LEFT RETROGRADE PYELOGRAM;  Surgeon: Ceasar Mons,  MD;  Location: [place];  Service: Urology;  Laterality: Left;  . HYMENECTOMY N/A 12/05/2018   Procedure: Vaginoplasty;  Surgeon: [name], MD;  Location: [facility];  Service: Gynecology;  Laterality: N/A;  Please have four 2-0 Vicryl on a CT-1 and 36ms of 0.25% Marcaine  . VAGINA SURGERY  2010   extra skin removed  . WISDOM TOOTH EXTRACTION      Family History  Problem Relation Age of Onset  . Hypertension Mother   . Stroke Mother   . Diabetes Father   . Hyperthyroidism Father   . Prostate cancer Father     Allergies  Allergen Reactions  . Codeine Nausea Only  . Peanut Oil      Slurred speech    Current Outpatient Medications on File Prior to Visit  Medication Sig Dispense Refill  . acetaminophen (TYLENOL) 650 MG CR tablet Take 1 tablet by mouth at bedtime. And as needed    . atorvastatin (LIPITOR) 40 MG tablet Take 1 tablet (40 mg total) by mouth daily. For cholesterol. 90 tablet 3  . blood glucose meter kit and supplies KIT Dispense based on patient and insurance preference. Use up to four times daily as directed. (FOR ICD-9 250.00, 250.01). 1 each 0  . Blood Glucose Monitoring Suppl (FREESTYLE LITE) DEVI     . Cholecalciferol (VITAMIN D3) 5000 units CAPS Take 1 capsule by mouth daily.    . fexofenadine (ALLEGRA) 180 MG tablet Take 180 mg by mouth daily.    .Marland KitchenglipiZIDE (GLUCOTROL) 10 MG tablet TAKE 1 TABLET BY MOUTH TWO TIMES DAILY FOR DIABETES 180 tablet 2  . glucose blood (FREESTYLE LITE) test strip Use as instructed to test blood sugar up to 4 times daily 300 each 2  . Lancets (FREESTYLE) lancets     . lisinopril (ZESTRIL) 10 MG tablet Take 1 tablet (10 mg total) by mouth daily. For blood pressure. 90 tablet 3  . meloxicam (MOBIC) 15 MG tablet Take 15 mg by mouth daily.    . metFORMIN (GLUCOPHAGE-XR) 500 MG 24 hr tablet Take 2 tablets (1,000 mg total) by mouth daily with breakfast. For diabetes. 180 tablet 1  . norethindrone (AYGESTIN) 5 MG tablet Take 1 tablet (5 mg total) by mouth daily. To initiate a cycle 30 tablet 1  . Omega-3 Fatty Acids (FISH OIL) 1200 MG CAPS 2 cap twice daily (Patient taking differently: 1 in am 2 in pm)    . vitamin B-12 (CYANOCOBALAMIN) 100 MCG tablet Take 1 capsule by mouth daily.     No current facility-administered medications on file prior to visit.    BP 124/76   Pulse 91   Temp 97.6 F (36.4 C) (Temporal)   Wt 191 lb (86.6 kg)   SpO2 97%   BMI 32.79 kg/m    Objective:   Physical Exam  Constitutional: She appears well-nourished.  HENT:  Right Ear: Tympanic membrane is not erythematous and not bulging.  Left Ear:  Tympanic membrane is not erythematous and not bulging. A middle ear effusion is present.  Fluid buildup behind left TM.  Cardiovascular: Normal rate.  Respiratory: Effort normal.  No cough  Musculoskeletal:     Cervical back: Neck supple.           Assessment & Plan:

## 2019-07-31 NOTE — Patient Instructions (Signed)
Nasal Congestion/Ear Pressure/Sinus Pressure: Start using Flonase (fluticasone) nasal spray. Instill 1 spray in each nostril twice daily. Use everyday.  Tylenol or Ibuprofen is fine for pain for now.   Please update me if your symptoms continue despite Flonase use.   It was a pleasure to see you today!

## 2019-08-01 ENCOUNTER — Ambulatory Visit: Payer: 59 | Attending: Internal Medicine

## 2019-08-01 DIAGNOSIS — Z23 Encounter for immunization: Secondary | ICD-10-CM

## 2019-08-01 NOTE — Progress Notes (Signed)
   Covid-19 Vaccination Clinic  Name:  Erin Marquez    MRN: [id number] DOB: [birth year]  08/01/2019  [name] was observed post Covid-19 immunization for 15 minutes without incident. She was provided with Vaccine Information Sheet and instruction to access the V-Safe system.   [name] was instructed to call 911 with any severe reactions post vaccine: Marland Kitchen Difficulty breathing  . Swelling of face and throat  . A fast heartbeat  . A bad rash all over body  . Dizziness and weakness   Immunizations Administered    Name Date Dose VIS Date Route   Pfizer COVID-19 Vaccine 08/01/2019  8:29 AM 0.3 mL 04/19/2019 Intramuscular   Manufacturer: [place]   Lot: [id number]   [place]: [id number]

## 2019-08-05 DIAGNOSIS — E1165 Type 2 diabetes mellitus with hyperglycemia: Secondary | ICD-10-CM | POA: Diagnosis not present

## 2019-08-05 MED FILL — OZEMPIC 0.25 OR 0.5 MG/DOSE: 2 | 28 days supply | Qty: 2 | Fill #0

## 2019-08-07 ENCOUNTER — Ambulatory Visit: Payer: 59

## 2019-08-09 ENCOUNTER — Emergency Department (HOSPITAL_BASED_OUTPATIENT_CLINIC_OR_DEPARTMENT_OTHER)
Admission: EM | Admit: 2019-08-09 | Discharge: 2019-08-09 | Disposition: A | Discharge status: Home / Self Care | Payer: 59 | Attending: Emergency Medicine | Admitting: Emergency Medicine

## 2019-08-09 ENCOUNTER — Other Ambulatory Visit: Payer: Self-pay

## 2019-08-09 ENCOUNTER — Emergency Department (HOSPITAL_BASED_OUTPATIENT_CLINIC_OR_DEPARTMENT_OTHER): Payer: 59

## 2019-08-09 ENCOUNTER — Encounter (HOSPITAL_COMMUNITY): Payer: Self-pay | Admitting: Orthopaedic Surgery

## 2019-08-09 ENCOUNTER — Encounter (HOSPITAL_BASED_OUTPATIENT_CLINIC_OR_DEPARTMENT_OTHER): Payer: Self-pay | Admitting: Emergency Medicine

## 2019-08-09 DIAGNOSIS — S61209A Unspecified open wound of unspecified finger without damage to nail, initial encounter: Secondary | ICD-10-CM

## 2019-08-09 DIAGNOSIS — Z03818 Encounter for observation for suspected exposure to other biological agents ruled out: Secondary | ICD-10-CM | POA: Diagnosis not present

## 2019-08-09 DIAGNOSIS — Z7984 Long term (current) use of oral hypoglycemic drugs: Secondary | ICD-10-CM | POA: Insufficient documentation

## 2019-08-09 DIAGNOSIS — Y939 Activity, unspecified: Secondary | ICD-10-CM | POA: Insufficient documentation

## 2019-08-09 DIAGNOSIS — I10 Essential (primary) hypertension: Secondary | ICD-10-CM | POA: Insufficient documentation

## 2019-08-09 DIAGNOSIS — Z20822 Contact with and (suspected) exposure to covid-19: Secondary | ICD-10-CM | POA: Diagnosis not present

## 2019-08-09 DIAGNOSIS — S61217A Laceration without foreign body of left little finger without damage to nail, initial encounter: Secondary | ICD-10-CM | POA: Diagnosis present

## 2019-08-09 DIAGNOSIS — E1169 Type 2 diabetes mellitus with other specified complication: Secondary | ICD-10-CM | POA: Insufficient documentation

## 2019-08-09 DIAGNOSIS — Z79899 Other long term (current) drug therapy: Secondary | ICD-10-CM | POA: Insufficient documentation

## 2019-08-09 DIAGNOSIS — Z91010 Allergy to peanuts: Secondary | ICD-10-CM | POA: Insufficient documentation

## 2019-08-09 DIAGNOSIS — S62617B Displaced fracture of proximal phalanx of left little finger, initial encounter for open fracture: Secondary | ICD-10-CM | POA: Diagnosis not present

## 2019-08-09 DIAGNOSIS — W540XXA Bitten by dog, initial encounter: Secondary | ICD-10-CM | POA: Insufficient documentation

## 2019-08-09 DIAGNOSIS — Z885 Allergy status to narcotic agent status: Secondary | ICD-10-CM | POA: Insufficient documentation

## 2019-08-09 DIAGNOSIS — S66127A Laceration of flexor muscle, fascia and tendon of left little finger at wrist and hand level, initial encounter: Secondary | ICD-10-CM | POA: Insufficient documentation

## 2019-08-09 DIAGNOSIS — S66397A Other injury of extensor muscle, fascia and tendon of left little finger at wrist and hand level, initial encounter: Secondary | ICD-10-CM | POA: Diagnosis not present

## 2019-08-09 DIAGNOSIS — Y999 Unspecified external cause status: Secondary | ICD-10-CM | POA: Diagnosis not present

## 2019-08-09 DIAGNOSIS — Y929 Unspecified place or not applicable: Secondary | ICD-10-CM | POA: Diagnosis not present

## 2019-08-09 DIAGNOSIS — E782 Mixed hyperlipidemia: Secondary | ICD-10-CM | POA: Diagnosis not present

## 2019-08-09 DIAGNOSIS — S61207A Unspecified open wound of left little finger without damage to nail, initial encounter: Secondary | ICD-10-CM | POA: Diagnosis not present

## 2019-08-09 DIAGNOSIS — S61452A Open bite of left hand, initial encounter: Secondary | ICD-10-CM | POA: Diagnosis not present

## 2019-08-09 HISTORY — DX: Polycystic ovarian syndrome: E28.2

## 2019-08-09 LAB — RESPIRATORY PANEL BY RT PCR (FLU A&B, COVID)
Influenza A by PCR: NEGATIVE
Influenza B by PCR: NEGATIVE
SARS Coronavirus 2 by RT PCR: NEGATIVE

## 2019-08-09 MED ORDER — AMOXICILLIN-POT CLAVULANATE 875-125 MG PO TABS: 1.0000 | ORAL_TABLET | Freq: Two times a day (BID) | ORAL | 0 refills | Status: DC

## 2019-08-09 MED ORDER — AMOXICILLIN-POT CLAVULANATE 875-125 MG PO TABS
1.0000 | ORAL_TABLET | Freq: Once | ORAL | Status: AC
  Administered 2019-08-09: 1 via ORAL
  Filled 2019-08-09: qty 1

## 2019-08-09 MED ORDER — NAPROXEN 500 MG PO TABS: 500.0000 mg | ORAL_TABLET | Freq: Two times a day (BID) | ORAL | 0 refills | Status: DC

## 2019-08-09 MED ORDER — NAPROXEN 250 MG PO TABS
500.0000 mg | ORAL_TABLET | Freq: Once | ORAL | Status: AC
  Administered 2019-08-09: 500 mg via ORAL
  Filled 2019-08-09: qty 2

## 2019-08-09 NOTE — Discharge Instructions (Signed)
You were seen in the emergency department today after a dog bite injury.  Your x-ray did not show any fractures or dislocations.  We are concerned that you injure the tendon of your pinky finger.  We have cleaned your wounds and applied a splint to your left fifth finger, please keep this in place until you are seen by the hand surgeon tomorrow.  We are sending you home with Augmentin, an antibiotic, to help prevent infection as well as naproxen, anti-inflammatory to help with pain.  Naproxen is a nonsteroidal anti-inflammatory medication that will help with pain and swelling. Be sure to take this medication as prescribed with food, 1 pill every 12 hours,  It should be taken with food, as it can cause stomach upset, and more seriously, stomach bleeding. Do not take other nonsteroidal anti-inflammatory medications with this such as Advil, Motrin, Aleve, Mobic, Goodie Powder, or Motrin.    You make take Tylenol per over the counter dosing with these medications.   We have prescribed you new medication(s) today. Discuss the medications prescribed today with your pharmacist as they can have adverse effects and interactions with your other medicines including over the counter and prescribed medications. Seek medical evaluation if you start to experience new or abnormal symptoms after taking one of these medicines, seek care immediately if you start to experience difficulty breathing, feeling of your throat closing, facial swelling, or rash as these could be indications of a more serious allergic reaction   Dr. Jeannie Fend, the hand surgeon on-call, is planning to perform surgery on your finger tomorrow morning.  Please go to [facility] in [place] to the main entrance tomorrow at 8:30 AM.  Do not eat or drink anything after midnight.  We have tested you for COVID-19, please quarantine after leaving the emergency department until your surgery tomorrow.  Please call animal control tomorrow as well  to discuss potential need for rabies vaccine.  Should they be unable to locate vaccination records or should they recommend the rabies vaccine you may return to the emergency department or go to our [facility] health urgent care near the [facility]  Return to the ER for new or worsening symptoms including but not limited to increased pain, fever, redness/drainage from the wounds, or any other concerns.

## 2019-08-09 NOTE — ED Provider Notes (Signed)
[facility] EMERGENCY DEPARTMENT Provider Note   CSN: [id number] Arrival date & time: 08/09/19  1429     History Chief Complaint  Patient presents with  . Animal Bite   Erin Marquez is a 49 y.o. female with a history of diabetes mellitus, hypertension, hyperlipidemia, and PCOS who presents to the emergency department status post dog bite injury to the left hand which occurred at 12 PM. Patient states that a neighbor's daughter's pitbull bit her left hand. Reports she has multiple wounds with pain and swelling s/p injury. Pain worse with movement, no alleviating factors. Has some paresthesias to the distal phalanx of the L 5th finger. Denies any other areas of injury. She is R hand dominant. Last tetanus was within past 5 years. Owner reported that they believed rabies vaccine are up to date and provided rabies tag but patient is skeptical of if this is true.  HPI     Past Medical History:  Diagnosis Date  . Diabetes mellitus without complication ([place])    type 2  . Hyperlipidemia   . Hypertension   . Polycystic ovarian syndrome     Patient Active Problem List   Diagnosis Date Noted  . Decreased renal function 07/10/2019  . Preventative health care 07/10/2019  . Rash and nonspecific skin eruption 12/27/2018  . Otalgia 11/15/2018  . Redundant hymenal ring tissue 11/06/2018  . Abnormal TSH 07/04/2018  . Seborrheic keratosis 09/06/2017  . PCOS (polycystic ovarian syndrome) 06/13/2017  . Type 2 diabetes mellitus with complication, without long-term current use of insulin ([place]) 06/13/2017  . Hypertension associated with diabetes ([employer]) 06/13/2017  . Mixed diabetic hyperlipidemia associated with type 2 diabetes mellitus ([place]) 06/13/2017  . Vitamin D deficiency 06/13/2017  . Environmental and seasonal allergies 06/13/2017  . Chronic Arthralgias- bilateral hands and feet 06/13/2017  . Family history of rheumatoid arthritis 06/13/2017  . SK (solar keratosis)- two on face  06/13/2017  . Elevated liver enzymes- ALT and AST since  06/13/2017  . Depression, recurrent ([place]) 06/16/2014  . Sciatica 05/13/2014    Past Surgical History:  Procedure Laterality Date  . CYSTOSCOPY/URETEROSCOPY/HOLMIUM LASER/STENT PLACEMENT Left 03/22/2019   Procedure: CYSTOSCOPY/URETEROSCOPY/STENT PLACEMENT/ BASKET STONE EXTRACTION/ LEFT RETROGRADE PYELOGRAM;  Surgeon: Ceasar Mons, MD;  Location: [facility];  Service: Urology;  Laterality: Left;  . HYMENECTOMY N/A 12/05/2018   Procedure: Vaginoplasty;  Surgeon: [name], MD;  Location: [place];  Service: Gynecology;  Laterality: N/A;  Please have four 2-0 Vicryl on a CT-1 and 15ms of 0.25% Marcaine  . VAGINA SURGERY  2010   extra skin removed  . WISDOM TOOTH EXTRACTION       OB History    Gravida  2   Para  2   Term  2   Preterm      AB      Living  2     SAB      TAB      Ectopic      Multiple      Live Births              Family History  Problem Relation Age of Onset  . Hypertension Mother   . Stroke Mother   . Diabetes Father   . Hyperthyroidism Father   . Prostate cancer Father     Social History   Tobacco Use  . Smoking status: Never Smoker  . Smokeless tobacco: Never Used  Substance Use Topics  .  Alcohol use: Yes    Alcohol/week: 1.0 standard drinks    Types: 1 Standard drinks or equivalent per week    Comment: Monthly  . Drug use: No    Home Medications Prior to Admission medications   Medication Sig Start Date End Date Taking? Authorizing Provider  Semaglutide,0.25 or 0.5MG/DOS, 2 MG/1.5ML SOPN Inject into the skin. 08/05/19  Yes [provider]  acetaminophen (TYLENOL) 650 MG CR tablet Take 1 tablet by mouth at bedtime. And as needed    [provider]  atorvastatin (LIPITOR) 40 MG tablet Take 1 tablet (40 mg total) by mouth daily. For cholesterol. 06/28/19   Pleas Koch, NP  blood glucose meter kit and  supplies KIT Dispense based on patient and insurance preference. Use up to four times daily as directed. (FOR ICD-9 250.00, 250.01). 07/04/18   Pleas Koch, NP  Blood Glucose Monitoring Suppl (FREESTYLE LITE) DEVI  07/04/18   [provider]  Cholecalciferol (VITAMIN D3) 5000 units CAPS Take 1 capsule by mouth daily.    [provider]  fexofenadine (ALLEGRA) 180 MG tablet Take 180 mg by mouth daily.    [provider]  glucose blood (FREESTYLE LITE) test strip Use as instructed to test blood sugar up to 4 times daily 07/25/18   Pleas Koch, NP  Lancets (FREESTYLE) lancets  07/04/18   [provider]  lisinopril (ZESTRIL) 10 MG tablet Take 1 tablet (10 mg total) by mouth daily. For blood pressure. 09/10/18   Pleas Koch, NP  meloxicam (MOBIC) 15 MG tablet Take 15 mg by mouth daily.    [provider]  metFORMIN (GLUCOPHAGE-XR) 500 MG 24 hr tablet Take 2 tablets (1,000 mg total) by mouth daily with breakfast. For diabetes. 04/01/19   Pleas Koch, NP  norethindrone (AYGESTIN) 5 MG tablet Take 1 tablet (5 mg total) by mouth daily. To initiate a cycle 01/28/19   [name], MD  Omega-3 Fatty Acids (FISH OIL) 1200 MG CAPS 2 cap twice daily Patient taking differently: 1 in am 2 in pm 07/05/17   Opalski, Neoma Laming, DO  vitamin B-12 (CYANOCOBALAMIN) 100 MCG tablet Take 1 capsule by mouth daily.    [provider]    Allergies    Peanut oil and Codeine  Review of Systems   Review of Systems  Constitutional: Negative for chills and fever.  Respiratory: Negative for shortness of breath.   Cardiovascular: Negative for chest pain.  Gastrointestinal: Negative for abdominal pain, nausea and vomiting.  Musculoskeletal: Positive for arthralgias and joint swelling.  Skin: Positive for wound.  Neurological: Positive for numbness.  All other systems reviewed and are negative.   Physical Exam Updated Vital Signs BP (!) 142/94 (BP  Location: Right Arm)   Pulse 92   Temp 98.9 F (37.2 C) (Oral)   Resp 18   Ht '5\' 5"'  (1.651 m)   Wt 38.9 kg   LMP 06/18/2019   SpO2 100%   BMI 14.27 kg/m   Physical Exam Vitals and nursing note reviewed.  Constitutional:      General: She is not in acute distress.    Appearance: Normal appearance. She is not ill-appearing or toxic-appearing.  HENT:     Head: Normocephalic and atraumatic.  Neck:     Comments: No midline tenderness.  Cardiovascular:     Rate and Rhythm: Normal rate.     Pulses:          Radial pulses are 2+ on  the right side and 2+ on the left side.  Pulmonary:     Effort: No respiratory distress.     Breath sounds: Normal breath sounds.  Abdominal:     General: There is no distension.  Musculoskeletal:     Cervical back: Normal range of motion and neck supple.     Comments: UEs: Patient has a 1 cm laceration to the dorsal aspect of the L 5th DIP joint with finger held in flexed position @ the DIP joint. Laceration is about 3 mm deep. Suspect tendon injury. She has abrasions to the dorsal 3rd MCP and to the dorsal proximal phalanges of the 2nd/3rd fingers as well as to the ventral aspect of the 5th distal phalanx and ventral aspect of the palm. Patient has intact AROM throughout the digits with the exception of the 5th DIP joint- cannot fully extend. Tender to palpation over wounds, especially over the third MCP as well as the fifth DIP & middle/distal phalanx. Otherwise fairly nontender. No anatomical snuff box tenderness.   Skin:    General: Skin is warm and dry.     Capillary Refill: Capillary refill takes less than 2 seconds.  Neurological:     Mental Status: She is alert.     Comments: Alert. Clear speech.  Patient has 5 out of 5 symmetric grip strength.  Sensation is grossly intact with the exception of decreased subjective sensation to the left hand 5th distal phalanx.   Psychiatric:        Mood and Affect: Mood normal.        Behavior: Behavior normal.            ED Results / Procedures / Treatments   Labs (all labs ordered are listed, but only abnormal results are displayed) Labs Reviewed - No data to display  EKG None  Radiology DG Hand Complete Left  Result Date: 08/09/2019 CLINICAL DATA:  Dog bite EXAM: LEFT HAND - COMPLETE 3+ VIEW COMPARISON:  None. FINDINGS: Frontal, oblique, and lateral views were obtained. There is flexion of the fifth DIP joint on all images. No fracture or dislocation. No joint space narrowing or erosion. No radiopaque foreign body or soft tissue air. IMPRESSION: No fracture or dislocation. No radiopaque foreign body or soft tissue air. No appreciable joint space narrowing. Note that there is flexion of the fifth DIP joint throughout this study. Electronically Signed   By: Lowella Grip III M.D.   On: 08/09/2019 15:38    Procedures Procedures (including critical care time)  Medications Ordered in ED Medications  naproxen (NAPROSYN) tablet 500 mg (has no administration in time range)  amoxicillin-clavulanate (AUGMENTIN) 875-125 MG per tablet 1 tablet (has no administration in time range)    ED Course  I have reviewed the triage vital signs and the nursing notes.  Pertinent labs & imaging results that were available during my care of the patient were reviewed by me and considered in my medical decision making (see chart for details).    MDM Rules/Calculators/A&P                     Patient presents to the emergency department status post dog bite injury to the left hand.  Wounds as pictured and documented above.  X-ray without underlying fracture or dislocation.  There is concern for fifth finger extensor tendon injury in relation to dorsal DIP laceration, patient also has subjective decreased sensation distal to this area as well--> will discuss with hand surgery on-call  17:17: CONSULT: Discussed with hand surgeon Dr. Creighton-recommends cleaning out the wound, bulky dressing, splint, place on  antibiotics.  N.p.o. after midnight with plan to go to the operating room tomorrow morning.  Patient to go to [place] entrance at 8:30 AM, likely surgery at 11 AM.  Requesting to our PCR Covid swab be obtained preoperatively.  Patient to quarantine until surgery tomorrow.  Plan carried out as discussed.  Patient is in agreement.  Her tetanus is up-to-date.  Rabies vaccine was informed to be up-to-date, she has a rabies tag number, we discussed options of care, recommended patient call animal control first thing tomorrow morning to discuss rabies tag information to ensure this is up-to-date, if not will need to return for rabies vaccination series.  Will start on Augmentin. I discussed results, treatment plan, need for follow-up, and return precautions with the patient. Provided opportunity for questions, patient confirmed understanding and is in agreement with plan.   Findings and plan of care discussed with supervising physician Dr. Roslynn Amble who is in agreement.   Final Clinical Impression(s) / ED Diagnoses Final diagnoses:  Dog bite, initial encounter  Open wound of finger with tendon injury, initial encounter    Rx / DC Orders ED Discharge Orders         Ordered    amoxicillin-clavulanate (AUGMENTIN) 875-125 MG tablet  Every 12 hours     08/09/19 1822    naproxen (NAPROSYN) 500 MG tablet  2 times daily     08/09/19 [address] [place] 08/09/19 1823    Lucrezia Starch, MD 08/13/19 [phone]

## 2019-08-09 NOTE — ED Notes (Signed)
Pt has animals rabies vaccination tag #

## 2019-08-09 NOTE — ED Notes (Signed)
Splint applied to left 4th finger for support.

## 2019-08-09 NOTE — ED Triage Notes (Signed)
Pt bit by a pit bull today at 12 pm.  Pt lives in [place]  Pt states the owner of the dog stated it had been immunized but pt felt unsure if was true.  Attempted to call vet which was in [place].  Pt has puncture wounds to left hand, and small laceration to pinky finger.  Pt has deformity to pinky.

## 2019-08-09 NOTE — Progress Notes (Signed)
Pt denies SOB, chest pain, and being under the care of a cardiologist. Pt stated that PCP is Dr. Alma Friendly. Pt denies having a stress test, echo and cardiac cath. Pt denies having a chest x ray in the last year. Pt denies recent labs. Pt made aware to stop takingAspirin (unless otherwise advised by surgeon), vitamins, fish oil and herbal medications. Do not take any NSAIDs ie: Ibuprofen, Advil,  Mobic, Naproxen (Aleve), Motrin, BC and Goody Powder. Pt made aware to hold Metformin DOS. Pt made aware to check CBG every 2 hours prior to arrival to hospital on DOS. Pt made aware to treat a CBG < 70 with 4 ounces of apple or cranberry juice, wait 15 minutes after intervention to recheck CBG, if CBG remains < 70, call Short Stay unit to speak with a nurse. Pt reminded to quarantine. Pt verbalized understanding of all pre-op instructions.

## 2019-08-10 ENCOUNTER — Encounter (HOSPITAL_COMMUNITY): Payer: Self-pay | Admitting: Orthopaedic Surgery

## 2019-08-10 ENCOUNTER — Encounter (HOSPITAL_COMMUNITY)
Admission: RE | Disposition: A | Discharge status: Home / Self Care | Payer: Self-pay | Source: Home / Self Care | Attending: Orthopaedic Surgery

## 2019-08-10 ENCOUNTER — Inpatient Hospital Stay (HOSPITAL_COMMUNITY): Payer: 59

## 2019-08-10 ENCOUNTER — Ambulatory Visit (HOSPITAL_COMMUNITY)
Admission: RE | Admit: 2019-08-10 | Discharge: 2019-08-10 | Disposition: A | Discharge status: Home / Self Care | Payer: 59 | Attending: Orthopaedic Surgery | Admitting: Orthopaedic Surgery

## 2019-08-10 ENCOUNTER — Inpatient Hospital Stay (HOSPITAL_COMMUNITY): Payer: 59 | Admitting: Certified Registered Nurse Anesthetist

## 2019-08-10 DIAGNOSIS — W540XXA Bitten by dog, initial encounter: Secondary | ICD-10-CM | POA: Insufficient documentation

## 2019-08-10 DIAGNOSIS — S66328A Laceration of extensor muscle, fascia and tendon of other finger at wrist and hand level, initial encounter: Secondary | ICD-10-CM | POA: Diagnosis not present

## 2019-08-10 DIAGNOSIS — Z885 Allergy status to narcotic agent status: Secondary | ICD-10-CM | POA: Insufficient documentation

## 2019-08-10 DIAGNOSIS — S62627B Displaced fracture of medial phalanx of left little finger, initial encounter for open fracture: Secondary | ICD-10-CM | POA: Insufficient documentation

## 2019-08-10 DIAGNOSIS — S61402A Unspecified open wound of left hand, initial encounter: Secondary | ICD-10-CM | POA: Diagnosis not present

## 2019-08-10 DIAGNOSIS — Z793 Long term (current) use of hormonal contraceptives: Secondary | ICD-10-CM | POA: Insufficient documentation

## 2019-08-10 DIAGNOSIS — Z791 Long term (current) use of non-steroidal anti-inflammatories (NSAID): Secondary | ICD-10-CM | POA: Diagnosis not present

## 2019-08-10 DIAGNOSIS — M1990 Unspecified osteoarthritis, unspecified site: Secondary | ICD-10-CM | POA: Diagnosis not present

## 2019-08-10 DIAGNOSIS — I10 Essential (primary) hypertension: Secondary | ICD-10-CM | POA: Diagnosis not present

## 2019-08-10 DIAGNOSIS — E282 Polycystic ovarian syndrome: Secondary | ICD-10-CM | POA: Insufficient documentation

## 2019-08-10 DIAGNOSIS — M12542 Traumatic arthropathy, left hand: Secondary | ICD-10-CM | POA: Diagnosis not present

## 2019-08-10 DIAGNOSIS — E119 Type 2 diabetes mellitus without complications: Secondary | ICD-10-CM | POA: Insufficient documentation

## 2019-08-10 DIAGNOSIS — Z79899 Other long term (current) drug therapy: Secondary | ICD-10-CM | POA: Insufficient documentation

## 2019-08-10 DIAGNOSIS — Z7984 Long term (current) use of oral hypoglycemic drugs: Secondary | ICD-10-CM | POA: Insufficient documentation

## 2019-08-10 DIAGNOSIS — S61452A Open bite of left hand, initial encounter: Secondary | ICD-10-CM | POA: Diagnosis present

## 2019-08-10 DIAGNOSIS — S62617B Displaced fracture of proximal phalanx of left little finger, initial encounter for open fracture: Secondary | ICD-10-CM | POA: Diagnosis not present

## 2019-08-10 DIAGNOSIS — S66397A Other injury of extensor muscle, fascia and tendon of left little finger at wrist and hand level, initial encounter: Secondary | ICD-10-CM | POA: Diagnosis not present

## 2019-08-10 DIAGNOSIS — E785 Hyperlipidemia, unspecified: Secondary | ICD-10-CM | POA: Diagnosis not present

## 2019-08-10 HISTORY — DX: Unspecified osteoarthritis, unspecified site: M19.90

## 2019-08-10 HISTORY — DX: Other seasonal allergic rhinitis: J30.2

## 2019-08-10 HISTORY — DX: Personal history of urinary calculi: Z87.442

## 2019-08-10 HISTORY — DX: Bitten by dog, initial encounter: W54.0XXA

## 2019-08-10 HISTORY — PX: I & D EXTREMITY: SHX5045

## 2019-08-10 HISTORY — DX: Depression, unspecified: F32.A

## 2019-08-10 LAB — POCT PREGNANCY, URINE: Preg Test, Ur: NEGATIVE

## 2019-08-10 LAB — GLUCOSE, CAPILLARY
Glucose-Capillary: 163 mg/dL — ABNORMAL HIGH (ref 70–99)
Glucose-Capillary: 137 mg/dL — ABNORMAL HIGH (ref 70–99)

## 2019-08-10 SURGERY — IRRIGATION AND DEBRIDEMENT EXTREMITY
Anesthesia: Monitor Anesthesia Care | Site: Finger | Laterality: Left

## 2019-08-10 MED ORDER — PROPOFOL 500 MG/50ML IV EMUL
INTRAVENOUS | Status: DC | PRN
  Administered 2019-08-10: 100 ug/kg/min via INTRAVENOUS

## 2019-08-10 MED ORDER — MIDAZOLAM HCL 2 MG/2ML IJ SOLN: 1.0000 mg | Freq: Once | INTRAMUSCULAR | Status: AC

## 2019-08-10 MED ORDER — FENTANYL CITRATE (PF) 100 MCG/2ML IJ SOLN
INTRAMUSCULAR | Status: AC
  Administered 2019-08-10: 100 ug via INTRAVENOUS
  Filled 2019-08-10: qty 2

## 2019-08-10 MED ORDER — PHENYLEPHRINE 40 MCG/ML (10ML) SYRINGE FOR IV PUSH (FOR BLOOD PRESSURE SUPPORT)
PREFILLED_SYRINGE | INTRAVENOUS | Status: AC
  Filled 2019-08-10: qty 20

## 2019-08-10 MED ORDER — LIDOCAINE HCL (CARDIAC) PF 100 MG/5ML IV SOSY
PREFILLED_SYRINGE | INTRAVENOUS | Status: DC | PRN
  Administered 2019-08-10: 100 mg via INTRAVENOUS

## 2019-08-10 MED ORDER — BUPIVACAINE HCL (PF) 0.25 % IJ SOLN
INTRAMUSCULAR | Status: AC
  Filled 2019-08-10: qty 30

## 2019-08-10 MED ORDER — FENTANYL CITRATE (PF) 100 MCG/2ML IJ SOLN: 25.0000 ug | INTRAMUSCULAR | Status: DC | PRN

## 2019-08-10 MED ORDER — FENTANYL CITRATE (PF) 250 MCG/5ML IJ SOLN
INTRAMUSCULAR | Status: AC
  Filled 2019-08-10: qty 5

## 2019-08-10 MED ORDER — ONDANSETRON HCL 4 MG/2ML IJ SOLN
INTRAMUSCULAR | Status: AC
  Filled 2019-08-10: qty 2

## 2019-08-10 MED ORDER — FENTANYL CITRATE (PF) 100 MCG/2ML IJ SOLN: 100.0000 ug | Freq: Once | INTRAMUSCULAR | Status: AC

## 2019-08-10 MED ORDER — LACTATED RINGERS IV SOLN: INTRAVENOUS | Status: DC

## 2019-08-10 MED ORDER — CEFAZOLIN SODIUM-DEXTROSE 2-3 GM-%(50ML) IV SOLR
INTRAVENOUS | Status: DC | PRN
  Administered 2019-08-10: 2 g via INTRAVENOUS

## 2019-08-10 MED ORDER — LIDOCAINE 2% (20 MG/ML) 5 ML SYRINGE
INTRAMUSCULAR | Status: AC
  Filled 2019-08-10: qty 5

## 2019-08-10 MED ORDER — PROPOFOL 10 MG/ML IV BOLUS
INTRAVENOUS | Status: AC
  Filled 2019-08-10: qty 20

## 2019-08-10 MED ORDER — EPHEDRINE SULFATE 50 MG/ML IJ SOLN
INTRAMUSCULAR | Status: DC | PRN
  Administered 2019-08-10: 10 mg via INTRAVENOUS
  Administered 2019-08-10: 5 mg via INTRAVENOUS
  Administered 2019-08-10: 10 mg via INTRAVENOUS

## 2019-08-10 MED ORDER — ONDANSETRON HCL 4 MG/2ML IJ SOLN: 4.0000 mg | Freq: Once | INTRAMUSCULAR | Status: DC | PRN

## 2019-08-10 MED ORDER — MIDAZOLAM HCL 2 MG/2ML IJ SOLN
INTRAMUSCULAR | Status: AC
  Filled 2019-08-10: qty 2

## 2019-08-10 MED ORDER — ONDANSETRON HCL 4 MG/2ML IJ SOLN
INTRAMUSCULAR | Status: DC | PRN
  Administered 2019-08-10: 4 mg via INTRAVENOUS

## 2019-08-10 MED ORDER — 0.9 % SODIUM CHLORIDE (POUR BTL) OPTIME
TOPICAL | Status: DC | PRN
  Administered 2019-08-10: 1000 mL

## 2019-08-10 MED ORDER — HYDROCODONE-ACETAMINOPHEN 5-325 MG PO TABS: 1.0000 | ORAL_TABLET | Freq: Four times a day (QID) | ORAL | 0 refills | Status: DC | PRN

## 2019-08-10 MED ORDER — PHENYLEPHRINE HCL (PRESSORS) 10 MG/ML IV SOLN
INTRAVENOUS | Status: DC | PRN
  Administered 2019-08-10 (×6): 80 ug via INTRAVENOUS

## 2019-08-10 MED ORDER — MIDAZOLAM HCL 2 MG/2ML IJ SOLN
INTRAMUSCULAR | Status: AC
  Administered 2019-08-10: 1 mg via INTRAVENOUS
  Filled 2019-08-10: qty 2

## 2019-08-10 MED ORDER — PROPOFOL 10 MG/ML IV BOLUS
INTRAVENOUS | Status: DC | PRN
  Administered 2019-08-10: 30 mg via INTRAVENOUS
  Administered 2019-08-10: 20 mg via INTRAVENOUS

## 2019-08-10 SURGICAL SUPPLY — 48 items
BNDG COHESIVE 2X5 TAN STRL LF (GAUZE/BANDAGES/DRESSINGS) ×2 IMPLANT
BNDG CONFORM 3 STRL LF (GAUZE/BANDAGES/DRESSINGS) ×2 IMPLANT
BNDG ELASTIC 4X5.8 VLCR STR LF (GAUZE/BANDAGES/DRESSINGS) ×2 IMPLANT
BNDG ESMARK 4X9 LF (GAUZE/BANDAGES/DRESSINGS) IMPLANT
BNDG GAUZE ELAST 4 BULKY (GAUZE/BANDAGES/DRESSINGS) ×6 IMPLANT
CAP PIN PROTECTOR ORTHO WHT (CAP) ×2 IMPLANT
CHLORAPREP W/TINT 26 (MISCELLANEOUS) ×2 IMPLANT
CORD BIPOLAR FORCEPS 12FT (ELECTRODE) ×2 IMPLANT
COVER SURGICAL LIGHT HANDLE (MISCELLANEOUS) ×2 IMPLANT
COVER WAND RF STERILE (DRAPES) ×2 IMPLANT
CUFF TOURN SGL QUICK 18X4 (TOURNIQUET CUFF) ×2 IMPLANT
CUFF TOURN SGL QUICK 24 (TOURNIQUET CUFF)
CUFF TRNQT CYL 24X4X16.5-23 (TOURNIQUET CUFF) IMPLANT
DRAPE OEC MINIVIEW 54X84 (DRAPES) ×2 IMPLANT
DRAPE U-SHAPE 47X51 STRL (DRAPES) ×2 IMPLANT
GAUZE SPONGE 4X4 12PLY STRL (GAUZE/BANDAGES/DRESSINGS) ×2 IMPLANT
GAUZE XEROFORM 1X8 LF (GAUZE/BANDAGES/DRESSINGS) ×2 IMPLANT
GAUZE XEROFORM 5X9 LF (GAUZE/BANDAGES/DRESSINGS) ×2 IMPLANT
GLOVE BIOGEL PI IND STRL 8 (GLOVE) ×1 IMPLANT
GLOVE BIOGEL PI INDICATOR 8 (GLOVE) ×1
GLOVE SURG SYN 7.5  E (GLOVE) ×1
GLOVE SURG SYN 7.5 E (GLOVE) ×1 IMPLANT
GOWN STRL REUS W/ TWL LRG LVL3 (GOWN DISPOSABLE) ×1 IMPLANT
GOWN STRL REUS W/TWL LRG LVL3 (GOWN DISPOSABLE) ×1
K-WIRE .035 (WIRE) ×2
KIT BASIN OR (CUSTOM PROCEDURE TRAY) ×2 IMPLANT
KIT TURNOVER KIT B (KITS) ×2 IMPLANT
KWIRE .035 (WIRE) ×1 IMPLANT
MANIFOLD NEPTUNE II (INSTRUMENTS) ×2 IMPLANT
NS IRRIG 1000ML POUR BTL (IV SOLUTION) ×2 IMPLANT
PACK ORTHO EXTREMITY (CUSTOM PROCEDURE TRAY) ×2 IMPLANT
PAD ARMBOARD 7.5X6 YLW CONV (MISCELLANEOUS) ×2 IMPLANT
PAD CAST 4YDX4 CTTN HI CHSV (CAST SUPPLIES) ×1 IMPLANT
PADDING CAST COTTON 4X4 STRL (CAST SUPPLIES) ×1
SET CYSTO W/LG BORE CLAMP LF (SET/KITS/TRAYS/PACK) ×2 IMPLANT
SPONGE LAP 4X18 RFD (DISPOSABLE) ×2 IMPLANT
SUT ETHIBOND 3-0 V-5 (SUTURE) ×2 IMPLANT
SUT PROLENE 4 0 PS 2 18 (SUTURE) ×2 IMPLANT
SWAB CULTURE ESWAB REG 1ML (MISCELLANEOUS) IMPLANT
SYR CONTROL 10ML LL (SYRINGE) IMPLANT
TOWEL GREEN STERILE (TOWEL DISPOSABLE) ×2 IMPLANT
TOWEL GREEN STERILE FF (TOWEL DISPOSABLE) ×2 IMPLANT
TUBE CONNECTING 12X1/4 (SUCTIONS) ×2 IMPLANT
TUBING TUR DISP (UROLOGICAL SUPPLIES) IMPLANT
UNDERPAD 30X30 (UNDERPADS AND DIAPERS) ×2 IMPLANT
UNDERPAD 30X36 HEAVY ABSORB (UNDERPADS AND DIAPERS) ×2 IMPLANT
WATER STERILE IRR 1000ML POUR (IV SOLUTION) ×2 IMPLANT
YANKAUER SUCT BULB TIP NO VENT (SUCTIONS) ×2 IMPLANT

## 2019-08-10 NOTE — Progress Notes (Signed)
Trouble obtaining pre-surgical lab work. Per Dr. Linna Caprice ok to cancel preop labs.

## 2019-08-10 NOTE — Discharge Instructions (Signed)
Discharge Instructions  - Keep dressings in place. Do not remove them. - The dressings must stay dry - Take all medication as prescribed. Transition to over the counter pain medication as your pain improves - Keep the hand elevated over the next 48-72 hours to help with pain and swelling - Move all digits not restricted by the dressings regularly to prevent stiffness - Please call to schedule a follow up appointment with Dr. Jeannie Fend and therapy next week at [phone] - Your pain medication have been send digitally to your pharmacy

## 2019-08-10 NOTE — Anesthesia Postprocedure Evaluation (Signed)
Anesthesia Post Note  Patient: Lorri Frederick  Procedure(s) Performed: IRRIGATION AND DEBRIDEMENT LEFT SMALL FINGER AND LEFT HAND WOUNDS, PERCUTANEOUS PINNING LEFT SMALL FINGER (Left Finger)     Patient location during evaluation: PACU Anesthesia Type: MAC Level of consciousness: awake and alert Pain management: pain level controlled Vital Signs Assessment: post-procedure vital signs reviewed and stable Respiratory status: spontaneous breathing, nonlabored ventilation, respiratory function stable and patient connected to nasal cannula oxygen Cardiovascular status: stable and blood pressure returned to baseline Postop Assessment: no apparent nausea or vomiting Anesthetic complications: no    Last Vitals:  Vitals:   08/10/19 1305 08/10/19 1320  BP: 118/66 119/68  Pulse: 73 76  Resp: 16 14  Temp:  36.7 C  SpO2: 100% 99%    Last Pain:  Vitals:   08/10/19 1320  TempSrc:   PainSc: 0-No pain                 Diogo Anne COKER

## 2019-08-10 NOTE — H&P (Signed)
ORTHOPAEDIC H&P  PCP:  Pleas Koch, NP  Chief Complaint: Left hand dog bite  HPI: Erin Marquez is a 49 y.o. female who complains of a left hand dog bite.  Yesterday she was breaking up an altercation between her dog and a stray dog when her left hand was bitten by the stray.  She sustained multiple puncture wounds to the left hand as well as a centimeter, transverse laceration over the dorsal aspect of the small finger DIP joint.  She was seen at [facility] where her wounds were cleansed and she was given some oral antibiotics.  The small finger was splinted.  Since discharge from the ER yesterday, she has noted some progressive pain and increased swelling over the dorsal aspect of the middle finger MP joint.  She has taken 1 dose of Augmentin.  She endorses mild paresthesias to the tip of the small finger but overall reports no other numbness and tingling in the hand.  She presents here for fixation of her left hand.  Past Medical History:  Diagnosis Date  . Arthritis   . Depression    PMH  . Diabetes mellitus without complication ([place])    type 2  . Dog bite    left hand  . History of kidney stones   . Hyperlipidemia   . Hypertension   . Polycystic ovarian syndrome   . Seasonal allergies    Past Surgical History:  Procedure Laterality Date  . CYSTOSCOPY/URETEROSCOPY/HOLMIUM LASER/STENT PLACEMENT Left 03/22/2019   Procedure: CYSTOSCOPY/URETEROSCOPY/STENT PLACEMENT/ BASKET STONE EXTRACTION/ LEFT RETROGRADE PYELOGRAM;  Surgeon: Ceasar Mons, MD;  Location: [facility];  Service: Urology;  Laterality: Left;  . HYMENECTOMY N/A 12/05/2018   Procedure: Vaginoplasty;  Surgeon: [name], MD;  Location: [place];  Service: Gynecology;  Laterality: N/A;  Please have four 2-0 Vicryl on a CT-1 and 82ms of 0.25% Marcaine  . VAGINA SURGERY  2010   extra skin removed  . WISDOM TOOTH EXTRACTION     Social History    Socioeconomic History  . Marital status: Married    Spouse name: Not on file  . Number of children: Not on file  . Years of education: Not on file  . Highest education level: Not on file  Occupational History  . Not on file  Tobacco Use  . Smoking status: Never Smoker  . Smokeless tobacco: Never Used  Substance and Sexual Activity  . Alcohol use: Yes    Alcohol/week: 1.0 standard drinks    Types: 1 Standard drinks or equivalent per week    Comment: Monthly  . Drug use: No  . Sexual activity: Yes    Birth control/protection: None, Surgical    Comment: vasectomy  Other Topics Concern  . Not on file  Social History Narrative  . Not on file   Social Determinants of Health   Financial Resource Strain:   . Difficulty of Paying Living Expenses:   Food Insecurity:   . Worried About RCharity fundraiserin the Last Year:   . RArboriculturistin the Last Year:   Transportation Needs:   . LFilm/video editor(Medical):   .Marland KitchenLack of Transportation (Non-Medical):   Physical Activity:   . Days of Exercise per Week:   . Minutes of Exercise per Session:   Stress:   . Feeling of Stress :   Social Connections:   . Frequency of Communication with Friends and  Family:   . Frequency of Social Gatherings with Friends and Family:   . Attends Religious Services:   . Active Member of Clubs or Organizations:   . Attends Archivist Meetings:   Marland Kitchen Marital Status:    Family History  Problem Relation Age of Onset  . Hypertension Mother   . Stroke Mother   . Diabetes Father   . Hyperthyroidism Father   . Prostate cancer Father    Allergies  Allergen Reactions  . Peanut Oil Anaphylaxis    Slurred speech Slurred speech  . Codeine Nausea Only and Nausea And Vomiting   Prior to Admission medications   Medication Sig Start Date End Date Taking? Authorizing Provider  acetaminophen (TYLENOL) 650 MG CR tablet Take 1 tablet by mouth at bedtime. And as needed    [provider]  amoxicillin-clavulanate (AUGMENTIN) 875-125 MG tablet Take 1 tablet by mouth every 12 (twelve) hours. 08/09/19   Petrucelli, Samantha R, PA-C  atorvastatin (LIPITOR) 40 MG tablet Take 1 tablet (40 mg total) by mouth daily. For cholesterol. 06/28/19   Pleas Koch, NP  blood glucose meter kit and supplies KIT Dispense based on patient and insurance preference. Use up to four times daily as directed. (FOR ICD-9 250.00, 250.01). 07/04/18   Pleas Koch, NP  Blood Glucose Monitoring Suppl (FREESTYLE LITE) DEVI  07/04/18   [provider]  Cholecalciferol (VITAMIN D3) 5000 units CAPS Take 1 capsule by mouth daily.    [provider]  fexofenadine (ALLEGRA) 180 MG tablet Take 180 mg by mouth daily.    [provider]  glucose blood (FREESTYLE LITE) test strip Use as instructed to test blood sugar up to 4 times daily 07/25/18   Pleas Koch, NP  Lancets (FREESTYLE) lancets  07/04/18   [provider]  lisinopril (ZESTRIL) 10 MG tablet Take 1 tablet (10 mg total) by mouth daily. For blood pressure. 09/10/18   Pleas Koch, NP  meloxicam (MOBIC) 15 MG tablet Take 15 mg by mouth daily.    [provider]  metFORMIN (GLUCOPHAGE-XR) 500 MG 24 hr tablet Take 2 tablets (1,000 mg total) by mouth daily with breakfast. For diabetes. 04/01/19   Pleas Koch, NP  naproxen (NAPROSYN) 500 MG tablet Take 1 tablet (500 mg total) by mouth 2 (two) times daily. 08/09/19   Petrucelli, Samantha R, PA-C  norethindrone (AYGESTIN) 5 MG tablet Take 1 tablet (5 mg total) by mouth daily. To initiate a cycle 01/28/19   [name], MD  Omega-3 Fatty Acids (FISH OIL) 1200 MG CAPS 2 cap twice daily Patient taking differently: 1 in am 2 in pm 07/05/17   Opalski, Deborah, DO  Semaglutide,0.25 or 0.5MG/DOS, 2 MG/1.5ML SOPN Inject into the skin. 08/05/19   [provider]  vitamin B-12 (CYANOCOBALAMIN) 100 MCG tablet Take 1 capsule by mouth daily.     [provider]   DG Hand Complete Left  Result Date: 08/09/2019 CLINICAL DATA:  Dog bite EXAM: LEFT HAND - COMPLETE 3+ VIEW COMPARISON:  None. FINDINGS: Frontal, oblique, and lateral views were obtained. There is flexion of the fifth DIP joint on all images. No fracture or dislocation. No joint space narrowing or erosion. No radiopaque foreign body or soft tissue air. IMPRESSION: No fracture or dislocation. No radiopaque foreign body or soft tissue air. No appreciable joint space narrowing. Note that there is flexion of the fifth DIP joint throughout this study. Electronically Signed   By: Gwyndolyn Saxon  Jasmine December III M.D.   On: 08/09/2019 15:38    Positive ROS: All other systems have been reviewed and were otherwise negative with the exception of those mentioned in the HPI and as above.  Physical Exam: General: Alert, no acute distress Cardiovascular: No pedal edema Respiratory: No cyanosis, no use of accessory musculature Skin: No lesions in the area of chief complaint Psychiatric: Patient is competent for consent with normal mood and affect Lymphatic: No axillary or cervical lymphadenopathy  MUSCULOSKELETAL: Examination of the left hand shows multiple puncture wounds and a transverse laceration over the dorsal aspect of the small finger DIP joint.  Dorsal to the middle finger MP joint there is a small puncture wound with some surrounding swelling and erythema.  There is a scant amount of purulent drainage which can be expressed from the wound.  This is moderately tender throughout this area as well.  She has decreased index, middle and small finger range of motion secondary to pain.  Over the small finger, there is a 1 cm, transverse laceration over the DIP joint.  The the distal phalanx is flexed and she is unable to further, actively extend the small finger.  There is no surrounding erythema or purulent drainage from the wound.  She has intact though subjective decreased sensation to both  radial and ulnar aspects of the small finger.  The tips of all digits are warm and well-perfused with brisk capillary refill.  Assessment: Left hand dog bite with early infection over the middle finger MP joint and an open, extensor tendon laceration to the small finger at the DIP joint  Plan: Plan to proceed forward with irrigation debridement today in the OR of the middle and small fingers.  Additionally we will plan to repair the extensor tendon and any other damage structure to either the middle and small finger including tendons, arteries and nerves.  The risks of surgery were discussed with the patient which include continued infection, bleeding, damage to surrounding structures including blood vessels and nerves, pain, stiffness and need for additional procedures.  After having this discussion informed consent was obtained and the patient did wish to proceed forward with surgery.  She will be discharged home postoperatively.  Plan will be to continue her with oral antibiotics and close follow-up with me in the therapy next week.  Therapy protocol will depend some on all required structures to be repaired.    Verner Mould, MD [phone]   08/10/2019 9:19 AM

## 2019-08-10 NOTE — Anesthesia Procedure Notes (Signed)
Anesthesia Regional Block: Supraclavicular block   Pre-Anesthetic Checklist: ,, timeout performed, Correct Patient, Correct Site, Correct Laterality, Correct Procedure, Correct Position, site marked, Risks and benefits discussed,  Surgical consent,  Pre-op evaluation,  At surgeon's request and post-op pain management  Laterality: Left  Prep: chloraprep       Needles:  Injection technique: Single-shot  Needle Type: Stimulator Needle - 80          Additional Needles:   Procedures:, nerve stimulator,,,,,,,  Narrative:  Start time: 08/10/2019 10:20 AM End time: 08/10/2019 10:30 AM Injection made incrementally with aspirations every 5 mL.  Performed by: Personally  Anesthesiologist: Roberts Gaudy, MD  Additional Notes: 20  Cc 0.75% Ropivacaine injected easily

## 2019-08-10 NOTE — Op Note (Signed)
PREOPERATIVE DIAGNOSIS: Left hand dog bite  POSTOPERATIVE DIAGNOSIS: Left hand dog bite with left middle finger MCP traumatic arthrotomy.  Left small finger middle phalanx open fracture and extensor tendon laceration  ATTENDING PHYSICIAN: Maudry Mayhew. Jeannie Fend, III, MD who was present and scrubbed for the entire case   ASSISTANT SURGEON: None.   ANESTHESIA: Regional with MAC  SURGICAL PROCEDURES:  1.  Exploration of penetrating wound to the dorsal, left middle finger MCP joint with irrigation debridement 2.  Irrigation debridement of open fracture to the left small finger middle phalanx including skin, subcutaneous tissue and bone 3.  Open reduction and internal fixation of left small finger middle phalanx condylar fracture 4.  Repair of the left small finger extensor tendon over the DIP joint  SURGICAL INDICATIONS: Patient is a 49 year old female who presented to the ER yesterday after having her left hand bitten by dog.  She states she was out with her own dog when her neighbors relatives dog got loose and came and was attacking her own of dog.  While breaking up a altercation she was bitten by the attacking dog.  She was seen at [facility] where she was found to have a multiple puncture wounds to the hand as well as a a transverse laceration over the DIP joint of the small finger.  Radiographs obtained in the ER did not show evidence of a fracture.  She was subsequently placed into splint and her wounds were cleansed.  She was subsequently then came in today for definitive treatment of her hand.  Since discharge from the hospital she noticed some increased swelling and redness around the puncture wound over the dorsal aspect of the middle finger MP joint.  Additionally she had continued pain to the small finger.  After discussing treatment options she did wish to proceed forward with the surgical irrigation debridement of her hand as well as repair of any structures as  indicated.  FINDING: There was a puncture wound over the dorsal aspect of the middle finger MP joint.  This did extend down to the MP joint itself to the middle finger resulting in a traumatic arthrotomy.  Successful irrigation and debridement of this joint was achieved.  Additionally there was a transverse laceration over the dorsal aspect of the DIP joint.  The extensor tendon was lacerated.  Additionally there was a transverse fracture of the middle phalanx and very elevating the distal condyles.  Irrigation debridement of the open fracture was performed as well as open reduction internal fixation with a single K wire.  The extensor tendon was also repaired.  DESCRIPTION OF PROCEDURE: The patient was identified in the preop holding area where the risk benefits and alternatives of the procedure were discussed with the patient.  These include but are not limited to infection, bleeding, damage to surrounding structures including blood vessels and nerves, pain, stiffness, malunion, nonunion, implant failure and need for additional procedures.  Informed consent was obtained at that time the patient's left hand was marked with a surgical marking pen.  She then underwent a left upper extremity plexus block by anesthesia.  She was brought to the operative suite where a timeout was performed identifying the correct patient operative site.  She was positioned supine on the operative table with her hand outstretched on a hand table.  A tourniquet is placed on the upper arm and the patient was induced under MAC sedation.  The left upper extremity was then prepped and draped in usual sterile fashion.  The limb was exsanguinated and the tourniquet was inflated.  Attention was first turned to the small puncture wound over the dorsal MP joint to the middle finger.  The wound was extended both proximally and distally utilizing a 15 blade.  Blunt dissection was continued down through the subcutaneous tissues.  The extensor  tendon was visualized and found to be fully intact.  There was a tract of soft tissue injury though which did extend down to the MP joint of the middle phalanx.  An opening the joint there was a blush of some cloudy fluid.  This area was cultured for both aerobic and anaerobic bacteria.  An opening within the joint capsule was then made using a scissors to allow for continued drainage.  The wound was then copiously irrigated with normal saline and the skin was loosely closed with 2 interrupted 4-0 Prolene sutures.  Attention was then turned to the small finger.  There is a transverse laceration over the dorsal aspect of the DIP joint.  This was extended both proximally and distally and skin flaps were elevated.  In doing so there was found to have complete, transverse laceration through the extensor mechanism.  Just deep to this though there was also found to be a transverse fracture through the middle phalanx involving the articular condyles.  This area was then copiously irrigated and debrided using rongeurs to debride skin, subcutaneous tissues and bone.  Once thorough irrigation debridement had been performed the fracture was reduced.  A 0.035 K wire was advanced through the tip of the digit and across the DIP joint.  The articular surface was reduced back to the middle phalanx in a near anatomic alignment and pinned in place using the retrograde K wire.  This was advanced into the subchondral bone proximally within the middle phalanx.  Fluoroscopic images in both PA and lateral planes were obtained which showed near anatomic alignment of the fracture.  The pin was cut external to her skin and a pin cap was placed.  At this point the extensor mechanism was mobilized and reapproximated.  It was secured in place with interrupted 3-0 Ethibond suture in a figure-of-eight fashion.  The wound was once again irrigated copiously and the skin was closed with interrupted 4-0 Prolene sutures.  Xeroform, 4 x 4's and a  well-padded digital splint was applied to the small finger.  The dressings did cover and provide bulk to the dorsal laceration incision over the middle finger MP joint.  The tourniquet was released and the patient had return of brisk capillary refill to all of her digits including the tip of the small finger.  At this point the patient was awoken from her anesthesia and taken the PACU in stable condition.  She tolerated the procedure well and there were no complications.  RADIOGRAPHIC INTERPRETATION: PA and lateral fluoroscopic images were obtained intraoperative.  These show near-anatomic alignment of transverse middle phalanx fracture just proximal to the articular condyles of the middle phalanx.  There is a single K wire advanced retrograde through the distal phalanx and DIP joint.  ESTIMATED BLOOD LOSS: Less than 20 mL  TOURNIQUET TIME: 58 minutes  SPECIMENS: Aerobic and anaerobic cultures x1  POSTOPERATIVE PLAN: Patient will be discharged home today.  I will see her back in clinic next week for wound check.  We will get her set up in therapy to continue some wound care as well as have a custom-made splint to immobilize her small finger DIP joint.  She is  to continue her Augmentin which was prescribed last night in the ER.  Pain medication were also sent to her pharmacy today.  IMPLANTS: 0.035 K wires x1

## 2019-08-10 NOTE — Anesthesia Preprocedure Evaluation (Addendum)
Anesthesia Evaluation  Patient identified by MRN, date of birth, ID band Patient awake    Reviewed: Allergy & Precautions, NPO status , Patient's Chart, lab work & pertinent test results  Airway Mallampati: II  TM Distance: >3 FB Neck ROM: Full    Dental  (+) Teeth Intact, Dental Advisory Given   Pulmonary    breath sounds clear to auscultation       Cardiovascular hypertension,  Rhythm:Regular Rate:Normal     Neuro/Psych    GI/Hepatic   Endo/Other  diabetes  Renal/GU      Musculoskeletal   Abdominal   Peds  Hematology   Anesthesia Other Findings   Reproductive/Obstetrics                             Anesthesia Physical Anesthesia Plan  ASA: II  Anesthesia Plan: MAC   Post-op Pain Management:  Regional for Post-op pain   Induction: Intravenous  PONV Risk Score and Plan: Ondansetron  Airway Management Planned: Natural Airway and Simple Face Mask  Additional Equipment:   Intra-op Plan:   Post-operative Plan:   Informed Consent: I have reviewed the patients History and Physical, chart, labs and discussed the procedure including the risks, benefits and alternatives for the proposed anesthesia with the patient or authorized representative who has indicated his/her understanding and acceptance.     Dental advisory given  Plan Discussed with: CRNA and Anesthesiologist  Anesthesia Plan Comments:         Anesthesia Quick Evaluation

## 2019-08-12 ENCOUNTER — Encounter: Payer: Self-pay | Admitting: *Deleted

## 2019-08-15 ENCOUNTER — Encounter: Payer: Self-pay | Admitting: Certified Registered Nurse Anesthetist

## 2019-08-15 DIAGNOSIS — M79642 Pain in left hand: Secondary | ICD-10-CM | POA: Diagnosis not present

## 2019-08-15 DIAGNOSIS — S61452A Open bite of left hand, initial encounter: Secondary | ICD-10-CM | POA: Diagnosis not present

## 2019-08-15 DIAGNOSIS — Z4789 Encounter for other orthopedic aftercare: Secondary | ICD-10-CM | POA: Diagnosis not present

## 2019-08-15 LAB — AEROBIC/ANAEROBIC CULTURE W GRAM STAIN (SURGICAL/DEEP WOUND): Culture: NORMAL

## 2019-08-15 NOTE — Transfer of Care (Signed)
Immediate Anesthesia Transfer of Care Note  Patient: Erin Marquez  Procedure(s) Performed: IRRIGATION AND DEBRIDEMENT LEFT SMALL FINGER AND LEFT HAND WOUNDS, PERCUTANEOUS PINNING LEFT SMALL FINGER (Left Finger)  Patient Location: PACU  Anesthesia Type:MAC combined with regional for post-op pain  Level of Consciousness: awake, alert  and oriented  Airway & Oxygen Therapy: Patient Spontanous Breathing and Patient connected to nasal cannula oxygen  Post-op Assessment: Report given to RN, Post -op Vital signs reviewed and stable and Patient moving all extremities  Post vital signs: Reviewed and stable  Last Vitals:  Vitals Value Taken Time  BP 119/68 08/10/19 1320  Temp 36.7 C 08/10/19 1320  Pulse 76 08/10/19 1320  Resp 14 08/10/19 1320  SpO2 99 % 08/10/19 1320    Last Pain:  Vitals:   08/10/19 1320  TempSrc:   PainSc: 0-No pain      Patients Stated Pain Goal: 5 (67/61/95 0932)  Complications: No apparent anesthesia complications

## 2019-08-19 DIAGNOSIS — M79642 Pain in left hand: Secondary | ICD-10-CM | POA: Diagnosis not present

## 2019-08-23 DIAGNOSIS — M79642 Pain in left hand: Secondary | ICD-10-CM | POA: Diagnosis not present

## 2019-08-26 ENCOUNTER — Ambulatory Visit: Payer: 59 | Attending: Internal Medicine

## 2019-08-26 DIAGNOSIS — Z23 Encounter for immunization: Secondary | ICD-10-CM

## 2019-08-26 NOTE — Progress Notes (Signed)
   Covid-19 Vaccination Clinic  Name:  Erin Marquez    MRN: [id number] DOB: 1970-12-18  08/26/2019  [name] was observed post Covid-19 immunization for 15 minutes without incident. She was provided with Vaccine Information Sheet and instruction to access the V-Safe system.   [name] was instructed to call 911 with any severe reactions post vaccine: Marland Kitchen Difficulty breathing  . Swelling of face and throat  . A fast heartbeat  . A bad rash all over body  . Dizziness and weakness   Immunizations Administered    Name Date Dose VIS Date Route   Pfizer COVID-19 Vaccine 08/26/2019  8:50 AM 0.3 mL 07/03/2018 Intramuscular   Manufacturer: [place]   Lot: [id number]   [place]: [id number]

## 2019-08-27 DIAGNOSIS — M79642 Pain in left hand: Secondary | ICD-10-CM | POA: Diagnosis not present

## 2019-08-28 DIAGNOSIS — S61452A Open bite of left hand, initial encounter: Secondary | ICD-10-CM | POA: Diagnosis not present

## 2019-08-28 DIAGNOSIS — Z4789 Encounter for other orthopedic aftercare: Secondary | ICD-10-CM | POA: Diagnosis not present

## 2019-08-29 ENCOUNTER — Encounter: Payer: Self-pay | Admitting: Radiology

## 2019-08-30 DIAGNOSIS — M79642 Pain in left hand: Secondary | ICD-10-CM | POA: Diagnosis not present

## 2019-09-03 DIAGNOSIS — M79642 Pain in left hand: Secondary | ICD-10-CM | POA: Diagnosis not present

## 2019-09-05 DIAGNOSIS — M79642 Pain in left hand: Secondary | ICD-10-CM | POA: Diagnosis not present

## 2019-09-10 DIAGNOSIS — M79642 Pain in left hand: Secondary | ICD-10-CM | POA: Diagnosis not present

## 2019-09-11 DIAGNOSIS — Z4789 Encounter for other orthopedic aftercare: Secondary | ICD-10-CM | POA: Diagnosis not present

## 2019-09-11 DIAGNOSIS — S61452A Open bite of left hand, initial encounter: Secondary | ICD-10-CM | POA: Diagnosis not present

## 2019-09-13 DIAGNOSIS — M79642 Pain in left hand: Secondary | ICD-10-CM | POA: Diagnosis not present

## 2019-09-17 DIAGNOSIS — M79642 Pain in left hand: Secondary | ICD-10-CM | POA: Diagnosis not present

## 2019-09-19 DIAGNOSIS — M79642 Pain in left hand: Secondary | ICD-10-CM | POA: Diagnosis not present

## 2019-09-24 DIAGNOSIS — M79642 Pain in left hand: Secondary | ICD-10-CM | POA: Diagnosis not present

## 2019-09-26 MED FILL — OZEMPIC 0.25 OR 0.5 MG/DOSE: 2 | 28 days supply | Qty: 2 | Fill #1

## 2019-09-27 ENCOUNTER — Other Ambulatory Visit: Payer: Self-pay | Admitting: Primary Care

## 2019-09-27 DIAGNOSIS — M79642 Pain in left hand: Secondary | ICD-10-CM | POA: Diagnosis not present

## 2019-09-27 DIAGNOSIS — E1159 Type 2 diabetes mellitus with other circulatory complications: Secondary | ICD-10-CM

## 2019-09-27 DIAGNOSIS — E118 Type 2 diabetes mellitus with unspecified complications: Secondary | ICD-10-CM

## 2019-09-27 DIAGNOSIS — I152 Hypertension secondary to endocrine disorders: Secondary | ICD-10-CM

## 2019-09-27 MED FILL — metFORMIN HCL ER 500 MG TB2: 500 | 90 days supply | Qty: 180 | Fill #0

## 2019-09-27 MED FILL — LISINOPRIL 10 MG TABS: 10 | 90 days supply | Qty: 90 | Fill #0

## 2019-09-30 DIAGNOSIS — M79642 Pain in left hand: Secondary | ICD-10-CM | POA: Diagnosis not present

## 2019-10-02 DIAGNOSIS — M25642 Stiffness of left hand, not elsewhere classified: Secondary | ICD-10-CM | POA: Diagnosis not present

## 2019-10-04 DIAGNOSIS — M25642 Stiffness of left hand, not elsewhere classified: Secondary | ICD-10-CM | POA: Diagnosis not present

## 2019-10-04 DIAGNOSIS — Z4789 Encounter for other orthopedic aftercare: Secondary | ICD-10-CM | POA: Diagnosis not present

## 2019-10-04 DIAGNOSIS — M20032 Swan-neck deformity of left finger(s): Secondary | ICD-10-CM | POA: Diagnosis not present

## 2019-10-04 DIAGNOSIS — S61452A Open bite of left hand, initial encounter: Secondary | ICD-10-CM | POA: Diagnosis not present

## 2019-10-09 DIAGNOSIS — H02402 Unspecified ptosis of left eyelid: Secondary | ICD-10-CM | POA: Diagnosis not present

## 2019-10-09 DIAGNOSIS — H2513 Age-related nuclear cataract, bilateral: Secondary | ICD-10-CM | POA: Diagnosis not present

## 2019-10-09 DIAGNOSIS — E119 Type 2 diabetes mellitus without complications: Secondary | ICD-10-CM | POA: Diagnosis not present

## 2019-10-09 LAB — HM DIABETES EYE EXAM

## 2019-10-10 ENCOUNTER — Encounter: Payer: Self-pay | Admitting: Primary Care

## 2019-10-10 DIAGNOSIS — M25642 Stiffness of left hand, not elsewhere classified: Secondary | ICD-10-CM | POA: Diagnosis not present

## 2019-10-10 MED FILL — ATORVASTATIN CALCIUM 40 MG: 40 | 90 days supply | Qty: 90 | Fill #1

## 2019-10-14 ENCOUNTER — Ambulatory Visit
Admission: RE | Admit: 2019-10-14 | Discharge: 2019-10-14 | Disposition: A | Discharge status: Home / Self Care | Payer: 59 | Source: Ambulatory Visit | Attending: Primary Care | Admitting: Primary Care

## 2019-10-14 ENCOUNTER — Other Ambulatory Visit: Payer: Self-pay

## 2019-10-14 DIAGNOSIS — Z1231 Encounter for screening mammogram for malignant neoplasm of breast: Secondary | ICD-10-CM | POA: Diagnosis not present

## 2019-10-18 DIAGNOSIS — M79642 Pain in left hand: Secondary | ICD-10-CM | POA: Diagnosis not present

## 2019-10-18 DIAGNOSIS — S61452A Open bite of left hand, initial encounter: Secondary | ICD-10-CM | POA: Diagnosis not present

## 2019-10-18 DIAGNOSIS — M20032 Swan-neck deformity of left finger(s): Secondary | ICD-10-CM | POA: Diagnosis not present

## 2019-10-18 DIAGNOSIS — Z4789 Encounter for other orthopedic aftercare: Secondary | ICD-10-CM | POA: Diagnosis not present

## 2019-10-21 DIAGNOSIS — M79642 Pain in left hand: Secondary | ICD-10-CM | POA: Diagnosis not present

## 2019-10-24 DIAGNOSIS — M79642 Pain in left hand: Secondary | ICD-10-CM | POA: Diagnosis not present

## 2019-10-29 DIAGNOSIS — M79642 Pain in left hand: Secondary | ICD-10-CM | POA: Diagnosis not present

## 2019-11-01 DIAGNOSIS — M79642 Pain in left hand: Secondary | ICD-10-CM | POA: Diagnosis not present

## 2019-11-07 ENCOUNTER — Ambulatory Visit (INDEPENDENT_AMBULATORY_CARE_PROVIDER_SITE_OTHER): Payer: 59 | Admitting: Orthopaedic Surgery

## 2019-11-07 ENCOUNTER — Other Ambulatory Visit: Payer: Self-pay

## 2019-11-07 ENCOUNTER — Encounter: Payer: Self-pay | Admitting: Orthopaedic Surgery

## 2019-11-07 DIAGNOSIS — W540XXD Bitten by dog, subsequent encounter: Secondary | ICD-10-CM | POA: Diagnosis not present

## 2019-11-07 DIAGNOSIS — S61452D Open bite of left hand, subsequent encounter: Secondary | ICD-10-CM

## 2019-11-07 NOTE — Progress Notes (Signed)
Office Visit Note   Patient: Erin Marquez           Date of Birth: 02/12/1971           MRN: [id number] Visit Date: 11/07/2019              Requested by: Pleas Koch, NP [place],  [place] [id number] PCP: Pleas Koch, NP   Assessment & Plan: Visit Diagnoses:  1. Dog bite of left hand, subsequent encounter     Plan: Based on my findings I feel that the swelling is normal and to be expected for the type of injury and the surgeries that she had.  I did review the operative records and I feel that Dr. Jeannie Fend did everything he could to correct the mallet finger but unfortunately the extensor tendon tissue quality was likely poor from the nature of the dog bite injury.  I feel that she would benefit from continued hand therapy but do not feel that any surgical intervention is warranted at this time.  I did also recommend that if she was interested in getting a third opinion that Dr. Caralyn Guile is who I would recommend.  Follow-Up Instructions: Return if symptoms worsen or fail to improve.   Orders:  No orders of the defined types were placed in this encounter.  No orders of the defined types were placed in this encounter.     Procedures: No procedures performed   Clinical Data: No additional findings.   Subjective: Chief Complaint  Patient presents with  . Left Hand - Injury    Left little finger and left middle finger    Erin Marquez is a very pleasant 49 year old female who is here for second opinion.  Approximately 3 months ago she sustained a significant left hand injury from a dog bite by a pitbull.  She was taken to the operating room by Dr.Creighton who performed washout of a traumatic arthrotomy of the third MCP joint as well as a repair of the extensor tendon of the small finger at the DIP joint with pinning.  She has done extensive hand therapy as well as splinting.  She continues to have stiffness and sensitivity especially when touched to the small  finger.  She has felt little improvement from hand therapy especially in terms of the mallet finger.   Review of Systems  Constitutional: Negative.   HENT: Negative.   Eyes: Negative.   Respiratory: Negative.   Cardiovascular: Negative.   Endocrine: Negative.   Musculoskeletal: Negative.   Neurological: Negative.   Hematological: Negative.   Psychiatric/Behavioral: Negative.   All other systems reviewed and are negative.    Objective: Vital Signs: There were no vitals taken for this visit.  Physical Exam Vitals and nursing note reviewed.  Constitutional:      Appearance: She is well-developed.  HENT:     Head: Normocephalic and atraumatic.  Pulmonary:     Effort: Pulmonary effort is normal.  Abdominal:     Palpations: Abdomen is soft.  Musculoskeletal:     Cervical back: Neck supple.  Skin:    General: Skin is warm.     Capillary Refill: Capillary refill takes less than 2 seconds.  Neurological:     Mental Status: She is alert and oriented to person, place, and time.  Psychiatric:        Behavior: Behavior normal.        Thought Content: Thought content normal.  Judgment: Judgment normal.     Ortho Exam Left hand that shows healed traumatic and surgical scars overlying the third MCP joint and DIP of the small finger.  She does have a considerable extensor lag of approximately 45 degrees of the DIP joint of the small finger.  No neurovascular compromise.  She has some persistent swelling of the hand.  Overall she is able to make a full composite fist. Specialty Comments:  No specialty comments available.  Imaging: No results found.   PMFS History: Patient Active Problem List   Diagnosis Date Noted  . Decreased renal function 07/10/2019  . Preventative health care 07/10/2019  . Rash and nonspecific skin eruption 12/27/2018  . Otalgia 11/15/2018  . Redundant hymenal ring tissue 11/06/2018  . Abnormal TSH 07/04/2018  . Seborrheic keratosis 09/06/2017    . PCOS (polycystic ovarian syndrome) 06/13/2017  . Type 2 diabetes mellitus with complication, without long-term current use of insulin ([place]) 06/13/2017  . Hypertension associated with diabetes ([place]) 06/13/2017  . Mixed diabetic hyperlipidemia associated with type 2 diabetes mellitus ([place]) 06/13/2017  . Vitamin D deficiency 06/13/2017  . Environmental and seasonal allergies 06/13/2017  . Chronic Arthralgias- bilateral hands and feet 06/13/2017  . Family history of rheumatoid arthritis 06/13/2017  . 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 Elevated liver enzymes- ALT and AST since  06/13/2017  . Depression, recurrent ([place]) 06/16/2014  . Sciatica 05/13/2014   Past Medical History:  Diagnosis Date  . Arthritis   . Depression    PMH  . Diabetes mellitus without complication ([place])    type 2  . Dog bite    left hand  . History of kidney stones   . Hyperlipidemia   . Hypertension   . Polycystic ovarian syndrome   . Seasonal allergies     Family History  Problem Relation Age of Onset  . Hypertension Mother   . Stroke Mother   . Diabetes Father   . Hyperthyroidism Father   . Prostate cancer Father     Past Surgical History:  Procedure Laterality Date  . CYSTOSCOPY/URETEROSCOPY/HOLMIUM LASER/STENT PLACEMENT Left 03/22/2019   Procedure: CYSTOSCOPY/URETEROSCOPY/STENT PLACEMENT/ BASKET STONE EXTRACTION/ LEFT RETROGRADE PYELOGRAM;  Surgeon: Ceasar Mons, MD;  Location: [facility];  Service: Urology;  Laterality: Left;  . HYMENECTOMY N/A 12/05/2018   Procedure: Vaginoplasty;  Surgeon: [name], MD;  Location: [facility];  Service: Gynecology;  Laterality: N/A;  Please have four 2-0 Vicryl on a CT-1 and 79mls of 0.25% Marcaine  . I & D EXTREMITY Left 08/10/2019   Procedure: IRRIGATION AND DEBRIDEMENT LEFT SMALL FINGER AND LEFT HAND WOUNDS, PERCUTANEOUS PINNING LEFT SMALL FINGER;  Surgeon: Verner Mould, MD;  Location: [facility];   Service: Orthopedics;  Laterality: Left;  Marland Kitchen VAGINA SURGERY  2010   extra skin removed  . WISDOM TOOTH EXTRACTION     Social History   Occupational History  . Not on file  Tobacco Use  . Smoking status: Never Smoker  . Smokeless tobacco: Never Used  Vaping Use  . Vaping Use: Never used  Substance and Sexual Activity  . Alcohol use: Yes    Alcohol/week: 1.0 standard drink    Types: 1 Standard drinks or equivalent per week    Comment: Monthly  . Drug use: No  . Sexual activity: Yes    Birth control/protection: None, Surgical    Comment: vasectomy

## 2019-11-14 MED FILL — OZEMPIC 0.25 OR 0.5 MG/DOSE: 2 | 28 days supply | Qty: 2 | Fill #3

## 2019-11-19 DIAGNOSIS — S61452A Open bite of left hand, initial encounter: Secondary | ICD-10-CM | POA: Diagnosis not present

## 2019-11-19 DIAGNOSIS — M20032 Swan-neck deformity of left finger(s): Secondary | ICD-10-CM | POA: Diagnosis not present

## 2019-12-16 ENCOUNTER — Other Ambulatory Visit (HOSPITAL_COMMUNITY): Payer: Self-pay | Admitting: Endocrinology

## 2019-12-16 MED FILL — OZEMPIC 0.25 OR 0.5 MG/DOSE: 2 | 28 days supply | Qty: 2 | Fill #0

## 2020-01-02 ENCOUNTER — Ambulatory Visit (INDEPENDENT_AMBULATORY_CARE_PROVIDER_SITE_OTHER): Payer: 59 | Admitting: Family Medicine

## 2020-01-02 ENCOUNTER — Other Ambulatory Visit: Payer: Self-pay

## 2020-01-02 ENCOUNTER — Encounter: Payer: Self-pay | Admitting: Family Medicine

## 2020-01-02 ENCOUNTER — Other Ambulatory Visit (HOSPITAL_COMMUNITY)
Admission: RE | Admit: 2020-01-02 | Discharge: 2020-01-02 | Disposition: A | Discharge status: Home / Self Care | Payer: 59 | Source: Ambulatory Visit | Attending: Family Medicine | Admitting: Family Medicine

## 2020-01-02 VITALS — BP 126/77 | HR 74 | Ht 64.0 in | Wt 186.4 lb

## 2020-01-02 DIAGNOSIS — Z01419 Encounter for gynecological examination (general) (routine) without abnormal findings: Secondary | ICD-10-CM

## 2020-01-02 DIAGNOSIS — Z124 Encounter for screening for malignant neoplasm of cervix: Secondary | ICD-10-CM | POA: Insufficient documentation

## 2020-01-02 NOTE — Progress Notes (Signed)
  Subjective:     Erin Marquez is a 49 y.o. female and is here for a comprehensive physical exam. The patient reports no problems.Dog bite, some PT, and hopeful to avoid more surgery. Possible PTSD.   The following portions of the patient's history were reviewed and updated as appropriate: allergies, current medications, past family history, past medical history, past social history, past surgical history and problem list.  Review of Systems Pertinent items noted in HPI and remainder of comprehensive ROS otherwise negative.   Objective:    BP 126/77   Pulse 74   Ht 5\' 4"  (1.626 m)   Wt 186 lb 6.4 oz (84.6 kg)   LMP 12/24/2019 (Exact Date)   BMI 32.00 kg/m  General appearance: alert, cooperative and appears stated age Head: Normocephalic, without obvious abnormality, atraumatic Neck: no adenopathy, supple, symmetrical, trachea midline and thyroid not enlarged, symmetric, no tenderness/mass/nodules Lungs: clear to auscultation bilaterally Breasts: normal appearance, no masses or tenderness Heart: regular rate and rhythm, S1, S2 normal, no murmur, click, rub or gallop Abdomen: soft, non-tender; bowel sounds normal; no masses,  no organomegaly Pelvic: cervix normal in appearance, external genitalia normal, no adnexal masses or tenderness, no cervical motion tenderness, uterus normal size, shape, and consistency and vagina normal without discharge Extremities: extremities normal, atraumatic, no cyanosis or edema Pulses: 2+ and symmetric Skin: Skin color, texture, turgor normal. No rashes or lesions Lymph nodes: Cervical, supraclavicular, and axillary nodes normal. Neurologic: Grossly normal    Assessment:    Healthy female exam.      Plan:     Encounter for gynecological examination without abnormal finding  Screening for cervical cancer - Plan: Cytology - PAP( [place])  Return in 1 year (on 01/01/2021).  See After Visit Summary for Counseling Recommendations

## 2020-01-03 LAB — CYTOLOGY - PAP
Comment: NEGATIVE
Diagnosis: NEGATIVE
High risk HPV: NEGATIVE

## 2020-01-06 MED FILL — metFORMIN HCL ER 500 MG TB2: 500 | 90 days supply | Qty: 180 | Fill #1

## 2020-01-06 MED FILL — ATORVASTATIN CALCIUM 40 MG: 40 | 90 days supply | Qty: 90 | Fill #2

## 2020-01-06 MED FILL — LISINOPRIL 10 MG TABS: 10 | 90 days supply | Qty: 90 | Fill #1

## 2020-01-14 MED FILL — OZEMPIC 0.25 OR 0.5 MG/DOSE: 2 | 28 days supply | Qty: 2 | Fill #1

## 2020-01-24 DIAGNOSIS — S61452A Open bite of left hand, initial encounter: Secondary | ICD-10-CM | POA: Diagnosis not present

## 2020-01-24 DIAGNOSIS — M20032 Swan-neck deformity of left finger(s): Secondary | ICD-10-CM | POA: Diagnosis not present

## 2020-02-21 LAB — HM HEPATITIS C SCREENING LAB: HM Hepatitis Screen: NEGATIVE

## 2020-02-21 LAB — HM HIV SCREENING LAB: HM HIV Screening: NEGATIVE

## 2020-03-04 MED FILL — OZEMPIC 0.25 OR 0.5 MG/DOSE: 2 | 28 days supply | Qty: 2 | Fill #2

## 2020-03-09 DIAGNOSIS — Z7984 Long term (current) use of oral hypoglycemic drugs: Secondary | ICD-10-CM | POA: Diagnosis not present

## 2020-03-09 DIAGNOSIS — E1165 Type 2 diabetes mellitus with hyperglycemia: Secondary | ICD-10-CM | POA: Diagnosis not present

## 2020-03-09 DIAGNOSIS — Z79899 Other long term (current) drug therapy: Secondary | ICD-10-CM | POA: Diagnosis not present

## 2020-03-09 LAB — HEMOGLOBIN A1C: Hemoglobin A1C: 7.3

## 2020-03-19 ENCOUNTER — Other Ambulatory Visit (HOSPITAL_COMMUNITY): Payer: Self-pay | Admitting: Internal Medicine

## 2020-03-19 MED FILL — FLUARIX QUADRIVALENT 0.5 ML: 0.5 | 1 days supply | Qty: 1 | Fill #0

## 2020-04-06 ENCOUNTER — Other Ambulatory Visit: Payer: Self-pay | Admitting: Primary Care

## 2020-04-06 ENCOUNTER — Other Ambulatory Visit: Payer: Self-pay

## 2020-04-06 DIAGNOSIS — E118 Type 2 diabetes mellitus with unspecified complications: Secondary | ICD-10-CM

## 2020-04-06 MED FILL — metFORMIN HCL ER 500 MG TB2: 500 | 30 days supply | Qty: 60 | Fill #0

## 2020-04-06 MED FILL — LISINOPRIL 10 MG TABS: 10 | 90 days supply | Qty: 90 | Fill #2

## 2020-04-20 MED FILL — OZEMPIC 0.25 OR 0.5 MG/DOSE: 2 | 28 days supply | Qty: 2 | Fill #3

## 2020-04-20 MED FILL — ATORVASTATIN 40 MG TABLET: 40 | 90 days supply | Qty: 90 | Fill #3

## 2020-05-11 DIAGNOSIS — E118 Type 2 diabetes mellitus with unspecified complications: Secondary | ICD-10-CM

## 2020-05-13 ENCOUNTER — Other Ambulatory Visit: Payer: Self-pay | Admitting: Primary Care

## 2020-05-13 DIAGNOSIS — E118 Type 2 diabetes mellitus with unspecified complications: Secondary | ICD-10-CM

## 2020-05-13 NOTE — Telephone Encounter (Signed)
Note to Pharmacy: Will need to be seen in office for more refills. Last OV 07/31/19 Last fill 04/06/20  #60/0

## 2020-05-14 ENCOUNTER — Other Ambulatory Visit: Payer: Self-pay | Admitting: Primary Care

## 2020-05-14 MED ORDER — METFORMIN HCL ER 500 MG PO TB24: ORAL_TABLET | ORAL | 0 refills | Status: DC

## 2020-05-14 MED FILL — metFORMIN HCL ER 500 MG TB2: 500 | 90 days supply | Qty: 180 | Fill #0

## 2020-05-14 NOTE — Telephone Encounter (Signed)
Received fax from pharmacy for refill. Have called patient l/m to call office. We need to make app for follow up before refills can be sent in.

## 2020-06-07 ENCOUNTER — Other Ambulatory Visit (HOSPITAL_COMMUNITY): Payer: Self-pay | Admitting: Endocrinology

## 2020-06-08 MED FILL — OZEMPIC 0.25 OR 0.5 MG/DOSE: 2 | 84 days supply | Qty: 5 | Fill #0

## 2020-06-09 ENCOUNTER — Telehealth: Payer: Self-pay | Admitting: Radiology

## 2020-06-09 NOTE — Telephone Encounter (Signed)
Left message for patient to call [id number] to schedule appointment

## 2020-06-12 DIAGNOSIS — M9901 Segmental and somatic dysfunction of cervical region: Secondary | ICD-10-CM | POA: Diagnosis not present

## 2020-06-12 DIAGNOSIS — M5412 Radiculopathy, cervical region: Secondary | ICD-10-CM | POA: Diagnosis not present

## 2020-06-12 DIAGNOSIS — M9903 Segmental and somatic dysfunction of lumbar region: Secondary | ICD-10-CM | POA: Diagnosis not present

## 2020-06-12 DIAGNOSIS — M5442 Lumbago with sciatica, left side: Secondary | ICD-10-CM | POA: Diagnosis not present

## 2020-06-12 DIAGNOSIS — M9902 Segmental and somatic dysfunction of thoracic region: Secondary | ICD-10-CM | POA: Diagnosis not present

## 2020-06-12 DIAGNOSIS — M5414 Radiculopathy, thoracic region: Secondary | ICD-10-CM | POA: Diagnosis not present

## 2020-06-15 DIAGNOSIS — M5412 Radiculopathy, cervical region: Secondary | ICD-10-CM | POA: Diagnosis not present

## 2020-06-15 DIAGNOSIS — M9902 Segmental and somatic dysfunction of thoracic region: Secondary | ICD-10-CM | POA: Diagnosis not present

## 2020-06-15 DIAGNOSIS — M9903 Segmental and somatic dysfunction of lumbar region: Secondary | ICD-10-CM | POA: Diagnosis not present

## 2020-06-15 DIAGNOSIS — M5442 Lumbago with sciatica, left side: Secondary | ICD-10-CM | POA: Diagnosis not present

## 2020-06-15 DIAGNOSIS — M5414 Radiculopathy, thoracic region: Secondary | ICD-10-CM | POA: Diagnosis not present

## 2020-06-15 DIAGNOSIS — M9901 Segmental and somatic dysfunction of cervical region: Secondary | ICD-10-CM | POA: Diagnosis not present

## 2020-06-16 DIAGNOSIS — M5414 Radiculopathy, thoracic region: Secondary | ICD-10-CM | POA: Diagnosis not present

## 2020-06-16 DIAGNOSIS — M5442 Lumbago with sciatica, left side: Secondary | ICD-10-CM | POA: Diagnosis not present

## 2020-06-16 DIAGNOSIS — M9901 Segmental and somatic dysfunction of cervical region: Secondary | ICD-10-CM | POA: Diagnosis not present

## 2020-06-16 DIAGNOSIS — M5412 Radiculopathy, cervical region: Secondary | ICD-10-CM | POA: Diagnosis not present

## 2020-06-16 DIAGNOSIS — M9903 Segmental and somatic dysfunction of lumbar region: Secondary | ICD-10-CM | POA: Diagnosis not present

## 2020-06-16 DIAGNOSIS — M9902 Segmental and somatic dysfunction of thoracic region: Secondary | ICD-10-CM | POA: Diagnosis not present

## 2020-06-17 DIAGNOSIS — M5414 Radiculopathy, thoracic region: Secondary | ICD-10-CM | POA: Diagnosis not present

## 2020-06-17 DIAGNOSIS — M5442 Lumbago with sciatica, left side: Secondary | ICD-10-CM | POA: Diagnosis not present

## 2020-06-17 DIAGNOSIS — M9902 Segmental and somatic dysfunction of thoracic region: Secondary | ICD-10-CM | POA: Diagnosis not present

## 2020-06-17 DIAGNOSIS — M9903 Segmental and somatic dysfunction of lumbar region: Secondary | ICD-10-CM | POA: Diagnosis not present

## 2020-06-17 DIAGNOSIS — M9901 Segmental and somatic dysfunction of cervical region: Secondary | ICD-10-CM | POA: Diagnosis not present

## 2020-06-17 DIAGNOSIS — M5412 Radiculopathy, cervical region: Secondary | ICD-10-CM | POA: Diagnosis not present

## 2020-06-18 ENCOUNTER — Encounter: Payer: Self-pay | Admitting: Emergency Medicine

## 2020-06-18 ENCOUNTER — Other Ambulatory Visit: Payer: Self-pay

## 2020-06-18 ENCOUNTER — Ambulatory Visit
Admission: EM | Admit: 2020-06-18 | Discharge: 2020-06-18 | Disposition: A | Discharge status: Home / Self Care | Payer: 59 | Attending: Family Medicine | Admitting: Family Medicine

## 2020-06-18 DIAGNOSIS — M5010 Cervical disc disorder with radiculopathy, unspecified cervical region: Secondary | ICD-10-CM

## 2020-06-18 MED ORDER — MELOXICAM 15 MG PO TABS: 15.0000 mg | ORAL_TABLET | Freq: Every day | ORAL | 0 refills | Status: DC

## 2020-06-18 MED ORDER — KETOROLAC TROMETHAMINE 30 MG/ML IJ SOLN
30.0000 mg | Freq: Once | INTRAMUSCULAR | Status: AC
  Administered 2020-06-18: 30 mg via INTRAMUSCULAR

## 2020-06-18 MED ORDER — CYCLOBENZAPRINE HCL 10 MG PO TABS: 10.0000 mg | ORAL_TABLET | Freq: Every day | ORAL | 0 refills | Status: DC

## 2020-06-18 NOTE — ED Triage Notes (Signed)
Pt c/o upper back pain onset 2 weeks ... reports pain radiates down her right arm and to collar bone  Sts she's not sure if she made the sx worse after visiting the Chiropractor   Reports pain increases when she turns her head to the left.   Taking OTC pain meds w/no relief and hot/cold compressions  A&O x4... NAD.Marland Kitchen. ambulatory

## 2020-06-18 NOTE — Discharge Instructions (Signed)
Resume meloxicam daily tomorrow to reduce inflammation.  Take cyclobenzaprine at bedtime only or if you will be home and not operate a motor vehicle as medication causes drowsiness.  Follow-up with your primary care provider to schedule follow-up appointment.
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
## 2020-06-21 ENCOUNTER — Ambulatory Visit
Admission: EM | Admit: 2020-06-21 | Discharge: 2020-06-21 | Disposition: A | Discharge status: Home / Self Care | Payer: 59 | Attending: Family Medicine | Admitting: Family Medicine

## 2020-06-21 ENCOUNTER — Other Ambulatory Visit: Payer: Self-pay

## 2020-06-21 DIAGNOSIS — M5412 Radiculopathy, cervical region: Secondary | ICD-10-CM | POA: Diagnosis not present

## 2020-06-21 IMAGING — MG DIGITAL SCREENING BILAT W/ TOMO W/ CAD
8 series · 9 of 24 positions shown · non-contrast
Comparison: Previous exam(s).

CLINICAL DATA: Screening.

EXAM:
DIGITAL SCREENING BILATERAL MAMMOGRAM WITH TOMO AND CAD

[R MLO synth-2D]
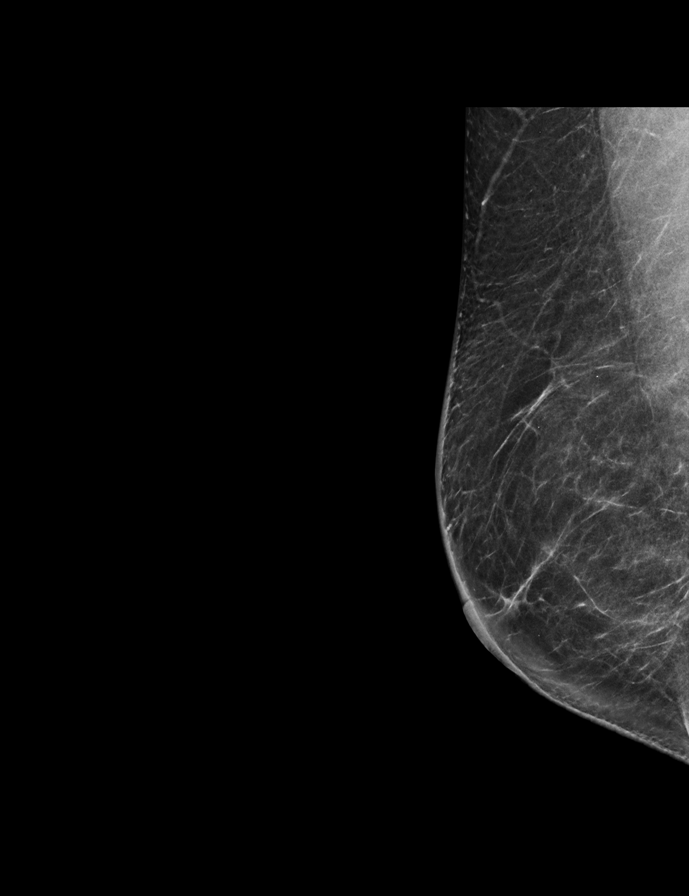

[R CC synth-2D]
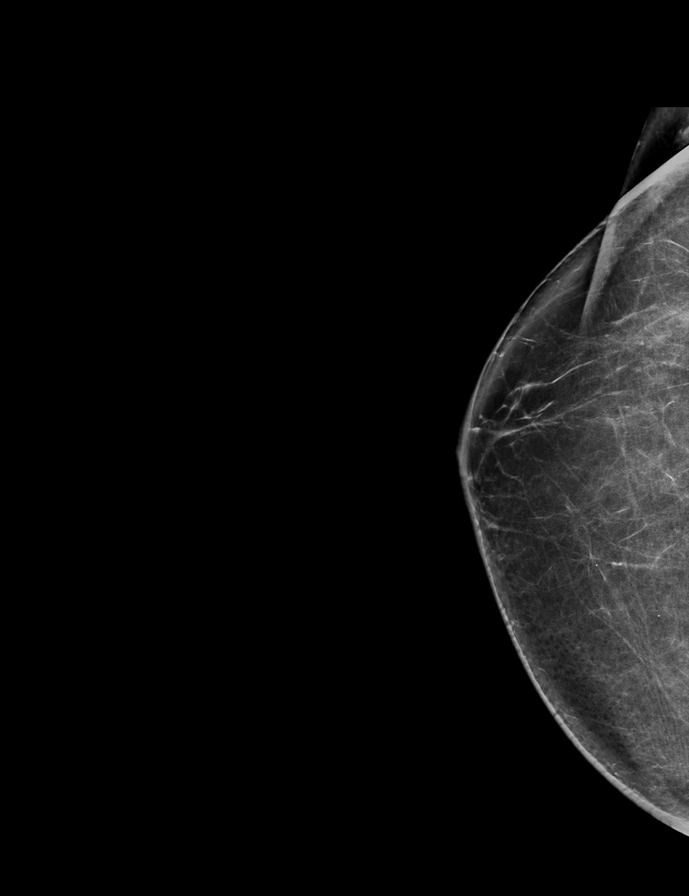

[L CC synth-2D]
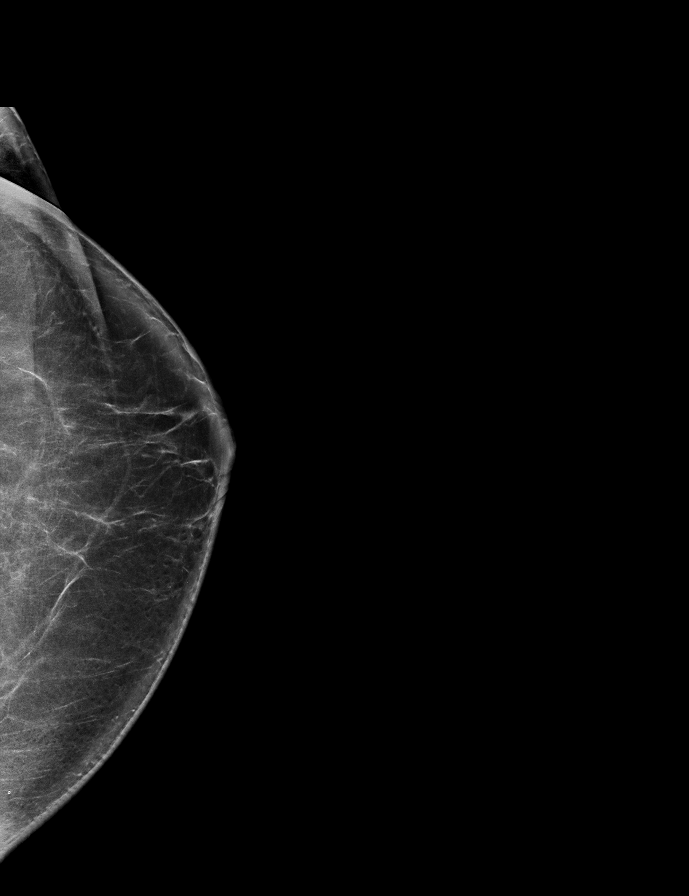

[L MLO synth-2D]
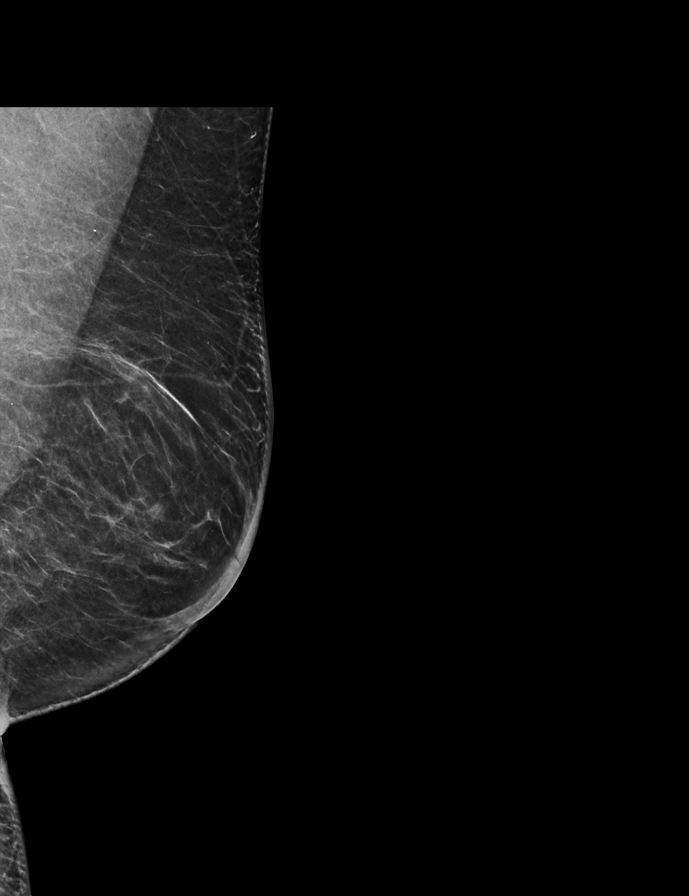

[R MLO tomo · 2 of 64 frames shown]
[frame 21/64]
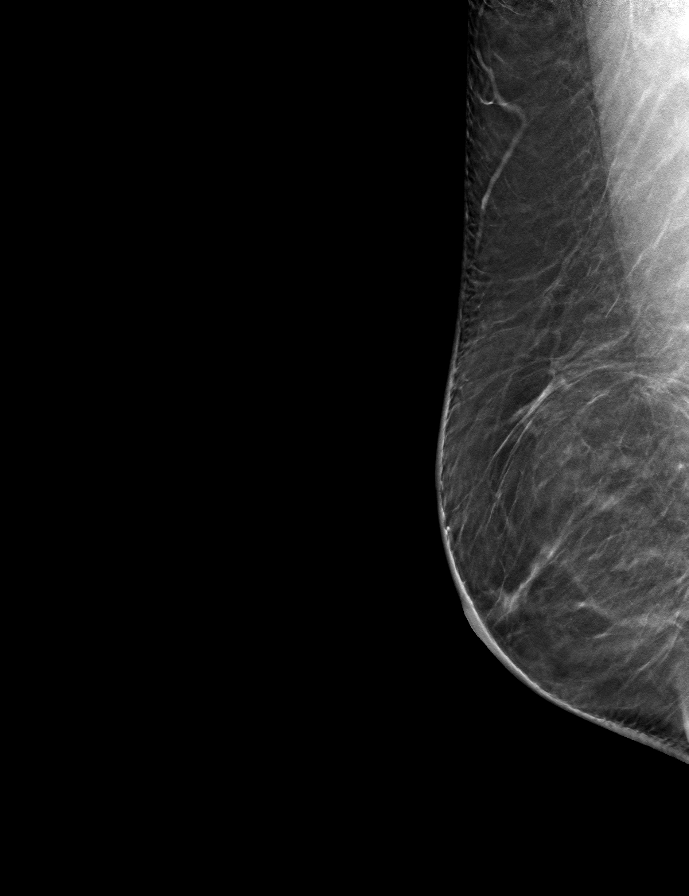
[frame 33/64]
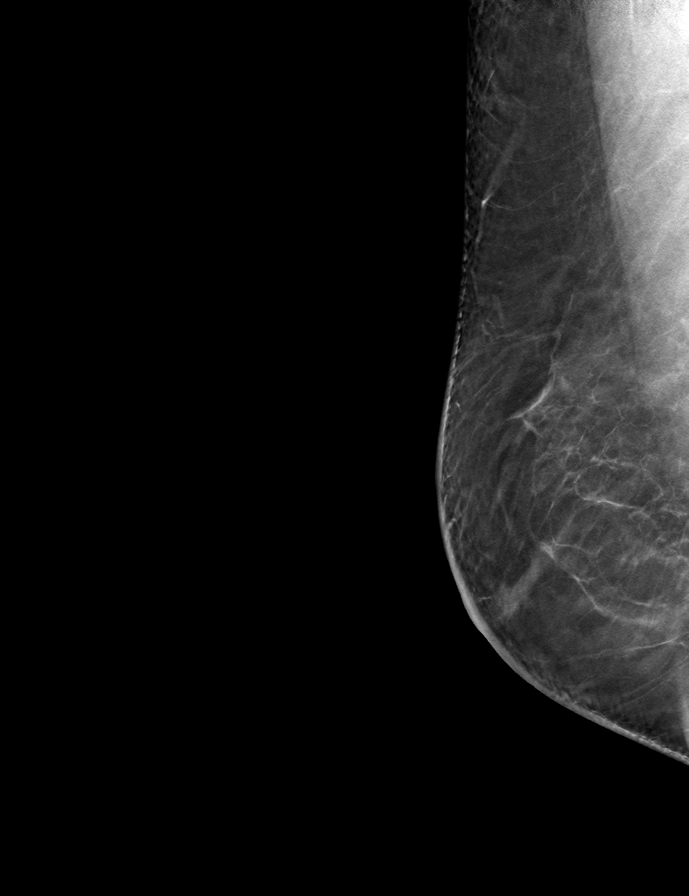

[L CC tomo · tomo slice 35/68.0]
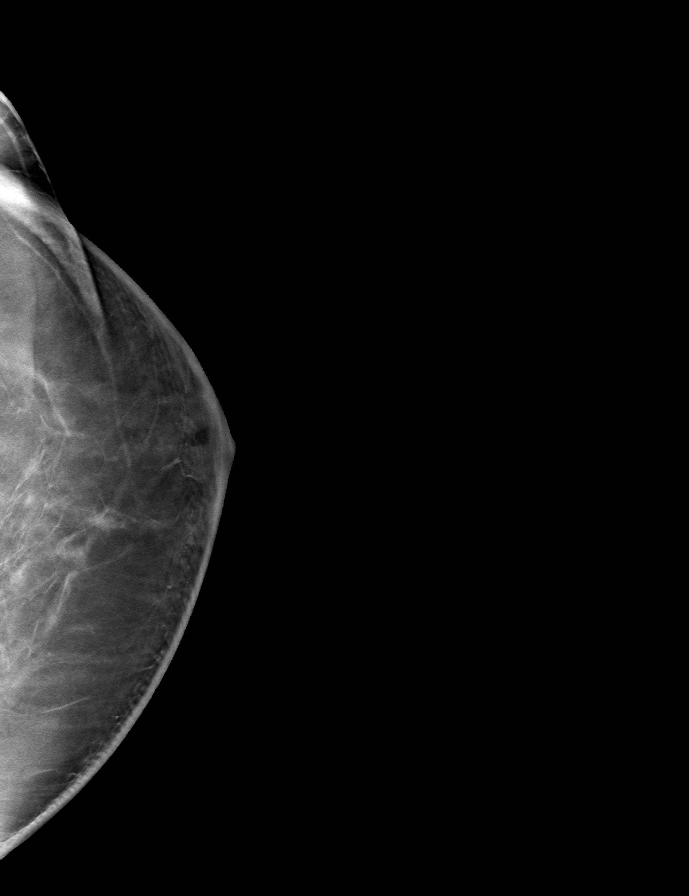

[R CC tomo · tomo slice 33/65.0]
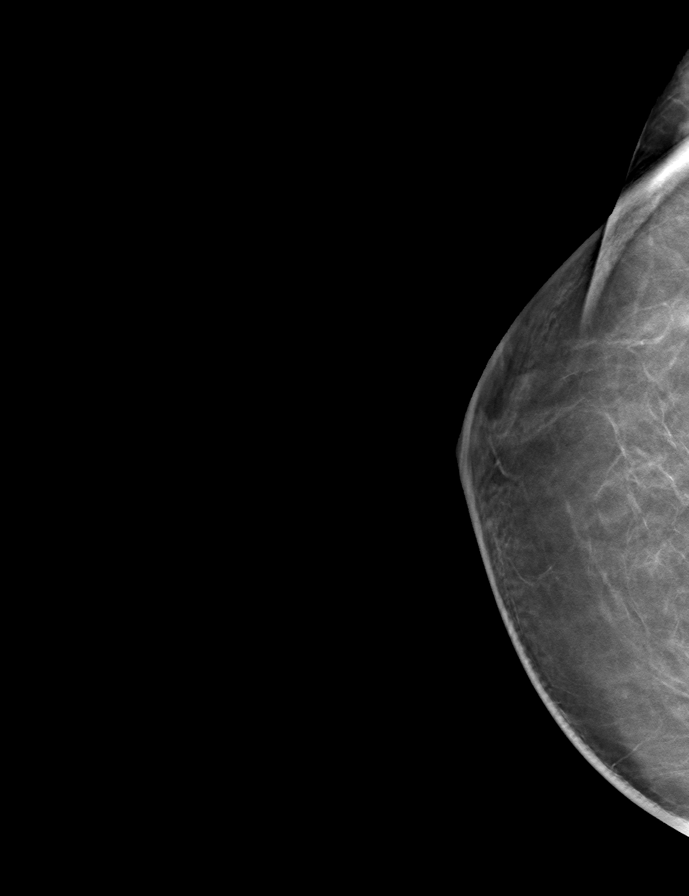

[L MLO tomo · tomo slice 32/63.0]
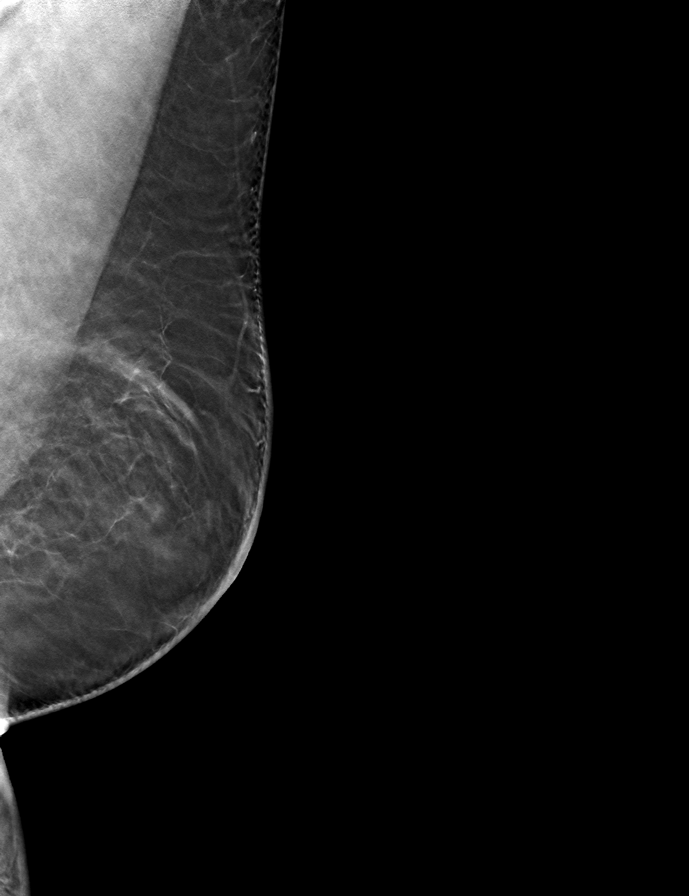

[9 of 24 positions shown; findings below may reference images not displayed]

ACR Breast Density Category b: There are scattered areas of
fibroglandular density.
FINDINGS: There are no findings suspicious for malignancy. Images were
processed with CAD.
IMPRESSION: No mammographic evidence of malignancy. A result letter of this
screening mammogram will be mailed directly to the patient.

RECOMMENDATION:
Screening mammogram in one year. (Code:[id number])

BI-RADS CATEGORY  1: Negative.

## 2020-06-21 MED ORDER — PREDNISONE 20 MG PO TABS: 40.0000 mg | ORAL_TABLET | Freq: Every day | ORAL | 0 refills | Status: DC

## 2020-06-21 NOTE — Discharge Instructions (Signed)
Monitor your blood sugars closely while taking the prednisone. Hold the meloxicam until this medication is completed, then you may resume.
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
## 2020-06-21 NOTE — ED Triage Notes (Signed)
Pt complains of neck pain that is worsening since previous visit as well as arm numbness. Pt has been diagnosed with a pinched nerve in her neck and she states it is causing arm numbness. Pt has seen a chiropractor multiple times since last visit. Pt was advised to see sports medicine and has an upcoming appointment.

## 2020-06-24 ENCOUNTER — Ambulatory Visit: Payer: 59 | Admitting: Family Medicine

## 2020-06-24 ENCOUNTER — Other Ambulatory Visit: Payer: Self-pay

## 2020-06-24 ENCOUNTER — Encounter: Payer: Self-pay | Admitting: Family Medicine

## 2020-06-24 ENCOUNTER — Ambulatory Visit (INDEPENDENT_AMBULATORY_CARE_PROVIDER_SITE_OTHER)
Admission: RE | Admit: 2020-06-24 | Discharge: 2020-06-24 | Disposition: A | Discharge status: Home / Self Care | Payer: 59 | Source: Ambulatory Visit | Attending: Family Medicine | Admitting: Family Medicine

## 2020-06-24 ENCOUNTER — Other Ambulatory Visit: Payer: Self-pay | Admitting: Family Medicine

## 2020-06-24 VITALS — BP 120/80 | HR 94 | Temp 97.5°F | Ht 64.0 in | Wt 192.5 lb

## 2020-06-24 DIAGNOSIS — R29898 Other symptoms and signs involving the musculoskeletal system: Secondary | ICD-10-CM

## 2020-06-24 DIAGNOSIS — R200 Anesthesia of skin: Secondary | ICD-10-CM

## 2020-06-24 DIAGNOSIS — M50122 Cervical disc disorder at C5-C6 level with radiculopathy: Secondary | ICD-10-CM | POA: Diagnosis not present

## 2020-06-24 DIAGNOSIS — M5412 Radiculopathy, cervical region: Secondary | ICD-10-CM | POA: Diagnosis not present

## 2020-06-24 DIAGNOSIS — R29818 Other symptoms and signs involving the nervous system: Secondary | ICD-10-CM

## 2020-06-24 DIAGNOSIS — E118 Type 2 diabetes mellitus with unspecified complications: Secondary | ICD-10-CM

## 2020-06-24 MED ORDER — PREDNISONE 20 MG PO TABS: ORAL_TABLET | ORAL | 0 refills | Status: DC

## 2020-06-24 MED ORDER — DIAZEPAM 2 MG PO TABS: ORAL_TABLET | ORAL | 0 refills | Status: DC

## 2020-06-24 MED FILL — predniSONE 20 MG TABS: 20 | 11 days supply | Qty: 15 | Fill #0

## 2020-06-24 NOTE — Progress Notes (Signed)
Erin Farone T. Zebulon Gantt, MD, [place]  [facility] and [place] at [facility] [place], [address]  Phone: [phone]  FAX: [place] - 50 y.o. female  MRN [id number]  Date of Birth: 24-Sep-1970  Date: 06/24/2020  PCP: Pleas Koch, NP  Referral: Pleas Koch, NP  Chief Complaint  Patient presents with  . Pinched Nerve    Neck-Seen at [facility] 06/18/20 & 06/21/2020.  She has also been seen by her chiropractor  . Numbness    Arm    This visit occurred during the SARS-CoV-2 public health emergency.  Safety protocols were in place, including screening questions prior to the visit, additional usage of staff PPE, and extensive cleaning of exam room while observing appropriate contact time as indicated for disinfecting solutions.   Subjective:   Erin Marquez is a 50 y.o. very pleasant female patient with Body mass index is 33.04 kg/m. who presents with the following:  Radicular R neck pain about 2 weeks.  This has been progressive in character, but concerning in that she has had progressive loss of sensation, strength, and fine motor skills on her right upper extremity as well as hand.  She is actually been seen twice in urgent care for urgent evaluation and she was ultimately given some Toradol, Flexeril, and after her second visit was given a short course of some oral prednisone.  None of this really seem to help, symptoms progressed, and she did think that the prednisone may have helped a little bit.  In addition to pain, she describes diffuse numbness of the right upper extremity from the upper arm all the way down to her hand and fingers.  She has not had any specific trauma, fall, or other injury.  Additionally, she has a diffuse loss of strength throughout the upper extremity including flexion, extension at the elbow as well as all movements with the hand and wrist.  She cannot supinate or  pronate either, or with significantly decreased strength.  She has also progressively lost fine motor coordination and the ability to pick up small things and do basic things such as cooking and having a hard time manipulating utensils for eating.  She has relatively diffuse pain, and she does note she additionally has some pain in the posterior aspect and in her neck as well as her right upper back and shoulder blade region.  She is right-hand dominant.  Lab Results  Component Value Date   HGBA1C 8.6 (H) 07/05/2019    R biceps Triceps Wrist ext Grip Supination and pronation  r hand and arm are very weak and feels numb Takes some mobic  Trouble writing and doing basically cooking  Sensation Anterior arm More sens r upper arm Definitely dorsal forearm, ? Volar Palm decr Dorsum 2-5 decreased    Review of Systems is noted in the HPI, as appropriate   Patient Active Problem List   Diagnosis Date Noted  . Decreased renal function 07/10/2019  . Preventative health care 07/10/2019  . Rash and nonspecific skin eruption 12/27/2018  . Otalgia 11/15/2018  . Redundant hymenal ring tissue 11/06/2018  . Abnormal TSH 07/04/2018  . Seborrheic keratosis 09/06/2017  . PCOS (polycystic ovarian syndrome) 06/13/2017  . Type 2 diabetes mellitus with complication, without long-term current use of insulin ([place]) 06/13/2017  . Hypertension associated with diabetes ([place]) 06/13/2017  . Mixed diabetic hyperlipidemia associated with type 2 diabetes mellitus ([place]) 06/13/2017  .  Vitamin D deficiency 06/13/2017  . Environmental and seasonal allergies 06/13/2017  . Chronic Arthralgias- bilateral hands and feet 06/13/2017  . Family history of rheumatoid arthritis 06/13/2017  . 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 Elevated liver enzymes- ALT and AST since  06/13/2017  . Depression, recurrent ([place]) 06/16/2014  . Sciatica 05/13/2014    Past Medical History:  Diagnosis Date  .  Arthritis   . Depression    PMH  . Diabetes mellitus without complication ([place])    type 2  . Dog bite    left hand  . History of kidney stones   . Hyperlipidemia   . Hypertension   . Polycystic ovarian syndrome   . Seasonal allergies     Past Surgical History:  Procedure Laterality Date  . CYSTOSCOPY/URETEROSCOPY/HOLMIUM LASER/STENT PLACEMENT Left 03/22/2019   Procedure: CYSTOSCOPY/URETEROSCOPY/STENT PLACEMENT/ BASKET STONE EXTRACTION/ LEFT RETROGRADE PYELOGRAM;  Surgeon: Ceasar Mons, MD;  Location: [facility];  Service: Urology;  Laterality: Left;  . HYMENECTOMY N/A 12/05/2018   Procedure: Vaginoplasty;  Surgeon: [name], MD;  Location: [place];  Service: Gynecology;  Laterality: N/A;  Please have four 2-0 Vicryl on a CT-1 and 24ms of 0.25% Marcaine  . I & D EXTREMITY Left 08/10/2019   Procedure: IRRIGATION AND DEBRIDEMENT LEFT SMALL FINGER AND LEFT HAND WOUNDS, PERCUTANEOUS PINNING LEFT SMALL FINGER;  Surgeon: CVerner Mould MD;  Location: [facility]  Service: Orthopedics;  Laterality: Left;  .Marland KitchenVAGINA SURGERY  2010   extra skin removed  . WISDOM TOOTH EXTRACTION      Family History  Problem Relation Age of Onset  . Hypertension Mother   . Stroke Mother   . Diabetes Father   . Hyperthyroidism Father   . Prostate cancer Father      Objective:   BP 120/80   Pulse 94   Temp (!) 97.5 F (36.4 C) (Temporal)   Ht '5\' 4"'  (1.626 m)   Wt 192 lb 8 oz (87.3 kg)   LMP 06/18/2020   SpO2 99%   BMI 33.04 kg/m   GEN: No acute distress; alert,appropriate. PULM: Breathing comfortably in no respiratory distress PSYCH: Normally interactive.    Cervical spine with a loss of approximately 40% of motion in all directions.  Approximate 65% loss of motion in lateral bending.  She does have pain throughout the posterior aspect of her neck and on the right side in the upper trapezius region as well as in the scapular region.  The  patient has upper extremity numbness, radicular pain, and decreased strength without any movement of the neck throughout the examination.  Sensation is altered to both soft touch as well as pinprick throughout and essentially the entirety of the right upper extremity.  The upper arm appears to be hypersensitive to pinprick.    The dorsum of the forearm is decidedly different compared to the contralateral side.  The volar aspect of the forearm he has a minor decrease loss of sensation compared to the contralateral side.  Entire palmar aspect of the hand is decreased to both pinprick and soft touch.  The dorsum of the hand is decreased to soft touch and pinprick from 2 through 5, as well as the dorsum of the wrist.  Strength testing: Arm flexion at the elbow: 2/5 Arm extension at the elbow: 2/5 Wrist extension is 2/5 Wrist flexion is 3 -/5 Grip on the right side is markedly abnormal compared to the left side,  and she is minimally able to grip Supination and pronation are also decidedly abnormal and decreased compared to the contralateral side  She has full range of motion at the shoulder without any significant difficulty.  Strength testing at the shoulder is 5/5.  Michel Bickers test, Jobe test, and Neer test do not provoke any symptoms.  The shoulder is stable on exam.  Radiology: Radiology interpretation of the patient's C-spine films has not returned at this time, but on my review the patient does have some mild to moderate degenerative disc disease in the lower aspect of her cervical spine.  No fractures are appreciated.  Grossly normal alignment.  Assessment and Plan:     ICD-10-CM   1. Cervical radiculopathy, acute  M54.12 DG Cervical Spine Complete    predniSONE (DELTASONE) 20 MG tablet    MR Cervical Spine Wo Contrast  2. Type 2 diabetes mellitus with complication, without long-term current use of insulin (HCC)  E11.8   3. Right arm weakness  R29.898 DG Cervical Spine Complete     predniSONE (DELTASONE) 20 MG tablet    MR Cervical Spine Wo Contrast  4. Wrist weakness  R29.898 DG Cervical Spine Complete    predniSONE (DELTASONE) 20 MG tablet    MR Cervical Spine Wo Contrast  5. Decreased grip strength of right hand  R29.898 DG Cervical Spine Complete    predniSONE (DELTASONE) 20 MG tablet    MR Cervical Spine Wo Contrast  6. Fine motor impairment  R29.818 DG Cervical Spine Complete   R29.898 predniSONE (DELTASONE) 20 MG tablet    MR Cervical Spine Wo Contrast  7. Right arm numbness  R20.0 DG Cervical Spine Complete    predniSONE (DELTASONE) 20 MG tablet    MR Cervical Spine Wo Contrast  8. Numbness of right hand  R20.0 DG Cervical Spine Complete    predniSONE (DELTASONE) 20 MG tablet    MR Cervical Spine Wo Contrast   Medical decision making in this case is high.  Acute threat to bodily function with threat to cervical spine and upper extremity function.  Markedly abnormal impairment in sensation and upper extremity strength throughout virtually the entirety of the right upper extremity.  Exam noted above is markedly abnormal and progressive and in my opinion needs to be addressed urgently.  Obtain an MRI of the cervical spine without contrast to evaluate for spinal cord compression and compromise, cervical myelopathy, multilevel disc herniation with foraminal compromise.  MRI findings will dictate plan of care.  I have asked for the patient's MRI to be done urgently.  Meds ordered this encounter  Medications  . predniSONE (DELTASONE) 20 MG tablet    Sig: 2 tabs po for 4 days, then 1 tab po for 7 days    Dispense:  15 tablet    Refill:  0  . diazepam (VALIUM) 2 MG tablet    Sig: Take 1 tablet 40 minutes before your MRI    Dispense:  1 tablet    Refill:  0   Medications Discontinued During This Encounter  Medication Reason  . glipiZIDE (GLUCOTROL) 10 MG tablet Completed Course  . predniSONE (DELTASONE) 20 MG tablet Completed Course   Orders Placed This  Encounter  Procedures  . DG Cervical Spine Complete  . MR Cervical Spine Wo Contrast    Follow-up: No follow-ups on file.  Signed,  Maud Deed. Taran Hable, MD   Outpatient Encounter Medications as of 06/24/2020  Medication Sig  . acetaminophen (TYLENOL) 650 MG CR tablet  Take 1 tablet by mouth at bedtime. And as needed  . atorvastatin (LIPITOR) 40 MG tablet Take 1 tablet (40 mg total) by mouth daily. For cholesterol.  . blood glucose meter kit and supplies KIT Dispense based on patient and insurance preference. Use up to four times daily as directed. (FOR ICD-9 250.00, 250.01).  . Blood Glucose Monitoring Suppl (FREESTYLE LITE) DEVI   . Cholecalciferol (VITAMIN D3) 5000 units CAPS Take 1 capsule by mouth daily.  . cyclobenzaprine (FLEXERIL) 10 MG tablet Take 1 tablet (10 mg total) by mouth at bedtime.  . diazepam (VALIUM) 2 MG tablet Take 1 tablet 40 minutes before your MRI  . fexofenadine (ALLEGRA) 180 MG tablet Take 180 mg by mouth daily.  Marland Kitchen glucose blood (FREESTYLE LITE) test strip Use as instructed to test blood sugar up to 4 times daily  . Lancets (FREESTYLE) lancets   . lisinopril (ZESTRIL) 10 MG tablet TAKE 1 TABLET BY MOUTH ONCE A DAY FOR BLOOD PRESSURE  . meloxicam (MOBIC) 15 MG tablet Take 1 tablet (15 mg total) by mouth daily.  . metFORMIN (GLUCOPHAGE-XR) 500 MG 24 hr tablet TAKE 2 TABLETS BY MOUTH DAILY WITH BREAKFAST FOR DIABETES  . Naproxen Sod-diphenhydrAMINE 220-25 MG TABS Take by mouth.  . norethindrone (AYGESTIN) 5 MG tablet Take 1 tablet (5 mg total) by mouth daily. To initiate a cycle  . Omega-3 Fatty Acids (FISH OIL) 1200 MG CAPS 2 cap twice daily (Patient taking differently: 1 in am 2 in pm)  . predniSONE (DELTASONE) 20 MG tablet 2 tabs po for 4 days, then 1 tab po for 7 days  . Semaglutide,0.25 or 0.5MG/DOS, 2 MG/1.5ML SOPN Inject into the skin.  Marland Kitchen vitamin B-12 (CYANOCOBALAMIN) 100 MCG tablet Take 1 capsule by mouth daily.  . [DISCONTINUED] glipiZIDE (GLUCOTROL) 10  MG tablet   . [DISCONTINUED] predniSONE (DELTASONE) 20 MG tablet Take 2 tablets (40 mg total) by mouth daily with breakfast.   No facility-administered encounter medications on file as of 06/24/2020.

## 2020-06-28 ENCOUNTER — Other Ambulatory Visit: Payer: Self-pay

## 2020-06-28 ENCOUNTER — Ambulatory Visit
Admission: RE | Admit: 2020-06-28 | Discharge: 2020-06-28 | Disposition: A | Discharge status: Home / Self Care | Payer: 59 | Source: Ambulatory Visit | Attending: Family Medicine | Admitting: Family Medicine

## 2020-06-28 DIAGNOSIS — M50223 Other cervical disc displacement at C6-C7 level: Secondary | ICD-10-CM | POA: Diagnosis not present

## 2020-06-28 DIAGNOSIS — M5021 Other cervical disc displacement,  high cervical region: Secondary | ICD-10-CM | POA: Diagnosis not present

## 2020-06-28 DIAGNOSIS — M50222 Other cervical disc displacement at C5-C6 level: Secondary | ICD-10-CM | POA: Diagnosis not present

## 2020-06-28 DIAGNOSIS — M5412 Radiculopathy, cervical region: Secondary | ICD-10-CM

## 2020-06-28 DIAGNOSIS — M50221 Other cervical disc displacement at C4-C5 level: Secondary | ICD-10-CM | POA: Diagnosis not present

## 2020-06-28 DIAGNOSIS — M4802 Spinal stenosis, cervical region: Secondary | ICD-10-CM | POA: Diagnosis not present

## 2020-06-28 DIAGNOSIS — M47812 Spondylosis without myelopathy or radiculopathy, cervical region: Secondary | ICD-10-CM | POA: Diagnosis not present

## 2020-06-28 DIAGNOSIS — R29898 Other symptoms and signs involving the musculoskeletal system: Secondary | ICD-10-CM

## 2020-06-28 DIAGNOSIS — R200 Anesthesia of skin: Secondary | ICD-10-CM

## 2020-06-28 DIAGNOSIS — R29818 Other symptoms and signs involving the nervous system: Secondary | ICD-10-CM

## 2020-06-29 ENCOUNTER — Other Ambulatory Visit: Payer: Self-pay | Admitting: Family Medicine

## 2020-06-29 DIAGNOSIS — M5412 Radiculopathy, cervical region: Secondary | ICD-10-CM

## 2020-06-29 DIAGNOSIS — R29818 Other symptoms and signs involving the nervous system: Secondary | ICD-10-CM

## 2020-06-29 DIAGNOSIS — R29898 Other symptoms and signs involving the musculoskeletal system: Secondary | ICD-10-CM

## 2020-06-29 DIAGNOSIS — M5020 Other cervical disc displacement, unspecified cervical region: Secondary | ICD-10-CM

## 2020-06-29 DIAGNOSIS — R200 Anesthesia of skin: Secondary | ICD-10-CM

## 2020-06-29 NOTE — Progress Notes (Signed)
Consult neurosurgery  DG Cervical Spine Complete  Result Date: 06/26/2020 CLINICAL DATA:  50 year old female with radicular pain. Loss of strength and sensation. EXAM: CERVICAL SPINE - COMPLETE 4+ VIEW COMPARISON:  None. FINDINGS: Straightening and mild reversal of cervical lordosis. Normal prevertebral soft tissue contour. Cervicothoracic junction alignment is within normal limits. Normal cervical AP alignment. Normal C1-C2 alignment and joint space. Bilateral posterior element alignment is within normal limits. No acute osseous abnormality identified. Moderate to severe C5-C6 disc space loss and endplate spurring. Other disc spaces relatively preserved. No significant facet hypertrophy. Negative visible upper chest. IMPRESSION: 1. 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 2.  No acute osseous abnormality identified. Electronically Signed   By: Genevie Deann M.D.   On: 06/26/2020 11:55   MR Cervical Spine Wo Contrast  Result Date: 06/28/2020 CLINICAL DATA:  Numbness or tingling.  Paresthesia EXAM: MRI CERVICAL SPINE WITHOUT CONTRAST TECHNIQUE: Multiplanar, multisequence MR imaging of the cervical spine was performed. No intravenous contrast was administered. COMPARISON:  None. FINDINGS: Alignment: Degenerative reversal of cervical lordosis. Vertebrae: No fracture, evidence of discitis, or bone lesion. Cord: Normal signal and morphology. Posterior Fossa, vertebral arteries, paraspinal tissues: Negative. Disc levels: C2-3: Unremarkable. C3-4: Small central disc protrusion C4-5: Central to right paracentral protrusion with right uncovertebral spurring. No compressive stenosis C5-6: Disc narrowing and bulging with left paracentral to foraminal protrusion and buttressing uncovertebral osteophyte. Advanced left foraminal stenosis. Spinal stenosis which flattens the cord greater on the left. No visible cord signal abnormality. C6-7: Disc bulging with left foraminal protrusion and mild to moderate left  foraminal stenosis. C7-T1:Unremarkable. IMPRESSION: 1. Disc degeneration most notable at C5-6 where there is related cord flattening and advanced left foraminal impingement. 2. C6-7 left foraminal protrusion which could affect the left C7 nerve root. Electronically Signed   By: Monte Fantasia M.D.   On: 06/28/2020 07:40       ICD-10-CM   1. Cervical disc herniation  M50.20 Ambulatory referral to Neurosurgery  2. Cervical radiculopathy, acute  M54.12 Ambulatory referral to Neurosurgery  3. Right arm weakness  R29.898 Ambulatory referral to Neurosurgery  4. Wrist weakness  R29.898 Ambulatory referral to Neurosurgery  5. Decreased grip strength of right hand  R29.898 Ambulatory referral to Neurosurgery  6. Numbness of right hand  R20.0 Ambulatory referral to Neurosurgery  7. Fine motor impairment  R29.818 Ambulatory referral to Neurosurgery   R29.898     Orders Placed This Encounter  Procedures  . Ambulatory referral to Neurosurgery     Signed,  Frederico Hamman T. Nayshawn Mesta, MD

## 2020-06-30 DIAGNOSIS — M5412 Radiculopathy, cervical region: Secondary | ICD-10-CM | POA: Diagnosis not present

## 2020-06-30 DIAGNOSIS — I10 Essential (primary) hypertension: Secondary | ICD-10-CM | POA: Diagnosis not present

## 2020-06-30 DIAGNOSIS — Z6831 Body mass index (BMI) 31.0-31.9, adult: Secondary | ICD-10-CM | POA: Diagnosis not present

## 2020-07-07 HISTORY — PX: CERVICAL FUSION: SHX112

## 2020-07-10 DIAGNOSIS — M4802 Spinal stenosis, cervical region: Secondary | ICD-10-CM | POA: Diagnosis not present

## 2020-07-14 ENCOUNTER — Other Ambulatory Visit: Payer: Self-pay | Admitting: Primary Care

## 2020-07-14 ENCOUNTER — Other Ambulatory Visit: Payer: Self-pay

## 2020-07-14 ENCOUNTER — Ambulatory Visit (INDEPENDENT_AMBULATORY_CARE_PROVIDER_SITE_OTHER): Payer: 59 | Admitting: Primary Care

## 2020-07-14 VITALS — BP 124/70 | HR 102 | Temp 97.8°F | Ht 66.75 in | Wt 188.0 lb

## 2020-07-14 DIAGNOSIS — E118 Type 2 diabetes mellitus with unspecified complications: Secondary | ICD-10-CM

## 2020-07-14 DIAGNOSIS — E1159 Type 2 diabetes mellitus with other circulatory complications: Secondary | ICD-10-CM

## 2020-07-14 DIAGNOSIS — E782 Mixed hyperlipidemia: Secondary | ICD-10-CM | POA: Diagnosis not present

## 2020-07-14 DIAGNOSIS — Z0000 Encounter for general adult medical examination without abnormal findings: Secondary | ICD-10-CM

## 2020-07-14 DIAGNOSIS — E559 Vitamin D deficiency, unspecified: Secondary | ICD-10-CM

## 2020-07-14 DIAGNOSIS — Z8261 Family history of arthritis: Secondary | ICD-10-CM

## 2020-07-14 DIAGNOSIS — G8929 Other chronic pain: Secondary | ICD-10-CM | POA: Insufficient documentation

## 2020-07-14 DIAGNOSIS — M542 Cervicalgia: Secondary | ICD-10-CM | POA: Diagnosis not present

## 2020-07-14 DIAGNOSIS — Z1231 Encounter for screening mammogram for malignant neoplasm of breast: Secondary | ICD-10-CM | POA: Diagnosis not present

## 2020-07-14 DIAGNOSIS — I152 Hypertension secondary to endocrine disorders: Secondary | ICD-10-CM | POA: Diagnosis not present

## 2020-07-14 DIAGNOSIS — E1169 Type 2 diabetes mellitus with other specified complication: Secondary | ICD-10-CM | POA: Diagnosis not present

## 2020-07-14 DIAGNOSIS — R7989 Other specified abnormal findings of blood chemistry: Secondary | ICD-10-CM | POA: Diagnosis not present

## 2020-07-14 HISTORY — DX: Other chronic pain: G89.29

## 2020-07-14 LAB — POCT GLYCOSYLATED HEMOGLOBIN (HGB A1C): Hemoglobin A1C: 10.9 % — AB (ref 4.0–5.6)

## 2020-07-14 MED ORDER — METFORMIN HCL ER 500 MG PO TB24
1000.0000 mg | ORAL_TABLET | Freq: Two times a day (BID) | ORAL | 3 refills | Status: DC
Start: 2020-07-14 — End: 2020-07-14

## 2020-07-14 MED FILL — metFORMIN HCL ER 500 MG TB2: 500 | 90 days supply | Qty: 360 | Fill #0

## 2020-07-14 NOTE — Progress Notes (Signed)
Subjective:    Patient ID: Erin Marquez, female    DOB: [birth year], 49 y.o.   MRN: [id number]  HPI   This visit occurred during the SARS-CoV-2 public health emergency.  Safety protocols were in place, including screening questions prior to the visit, additional usage of staff PPE, and extensive cleaning of exam room while observing appropriate contact time as indicated for disinfecting solutions.   Erin Marquez is a 50 year old female who presents today for complete physical.  Immunizations: -Tetanus: 2016 -Influenza: Completed this season -Pneumonia: 2021 -Covid-19: Completed 2 vaccines, plans on getting the booster.  Diet:  She endorses a healthy diet.  Exercise: No regular exercise, but is walking the dog 30 min daily  Eye exam: UTD Dental exam: Completes semi-annually   Pap Smear: 2021 Mammogram: Due in June 2022 Colonoscopy: Never completed  Hep C Screen: Due  BP Readings from Last 3 Encounters:  07/14/20 124/70  06/24/20 120/80  06/21/20 122/84    Review of Systems  Constitutional: Negative for unexpected weight change.  HENT: Negative for rhinorrhea.   Eyes: Negative for visual disturbance.  Respiratory: Negative for cough and shortness of breath.   Cardiovascular: Negative for chest pain.  Gastrointestinal: Negative for constipation and diarrhea.  Genitourinary: Negative for difficulty urinating.  Musculoskeletal: Positive for arthralgias and neck pain.  Skin: Negative for rash.  Allergic/Immunologic: Positive for environmental allergies.  Neurological: Positive for numbness. Negative for dizziness and headaches.  Psychiatric/Behavioral: The patient is not nervous/anxious.        Past Medical History:  Diagnosis Date  . Arthritis   . Depression    PMH  . Diabetes mellitus without complication ([place])    type 2  . Dog bite    left hand  . History of kidney stones   . Hyperlipidemia   . Hypertension   . Polycystic ovarian syndrome   . Seasonal allergies       Social History   Socioeconomic History  . Marital status: Married    Spouse name: Not on file  . Number of children: Not on file  . Years of education: Not on file  . Highest education level: Not on file  Occupational History  . Not on file  Tobacco Use  . Smoking status: Never Smoker  . Smokeless tobacco: Never Used  Vaping Use  . Vaping Use: Never used  Substance and Sexual Activity  . Alcohol use: Yes    Alcohol/week: 1.0 standard drink    Types: 1 Standard drinks or equivalent per week    Comment: Monthly  . Drug use: No  . Sexual activity: Yes    Birth control/protection: None, Surgical    Comment: vasectomy  Other Topics Concern  . Not on file  Social History Narrative  . Not on file   Social Determinants of Health   Financial Resource Strain: Not on file  Food Insecurity: Not on file  Transportation Needs: Not on file  Physical Activity: Not on file  Stress: Not on file  Social Connections: Not on file  Intimate Partner Violence: Not on file    Past Surgical History:  Procedure Laterality Date  . CYSTOSCOPY/URETEROSCOPY/HOLMIUM LASER/STENT PLACEMENT Left 03/22/2019   Procedure: CYSTOSCOPY/URETEROSCOPY/STENT PLACEMENT/ BASKET STONE EXTRACTION/ LEFT RETROGRADE PYELOGRAM;  Surgeon: Ceasar Mons, MD;  Location[facility];  Service: Urology;  Laterality: Left;  . HYMENECTOMY N/A 12/05/2018   Procedure: Vaginoplasty;  Surgeon: [name], MD;  Location: [facility];  Service:  Gynecology;  Laterality: N/A;  Please have four 2-0 Vicryl on a CT-1 and 30mls of 0.25% Marcaine  . I & D EXTREMITY Left 08/10/2019   Procedure: IRRIGATION AND DEBRIDEMENT LEFT SMALL FINGER AND LEFT HAND WOUNDS, PERCUTANEOUS PINNING LEFT SMALL FINGER;  Surgeon: Creighton, James J III, MD;  Location: [facility] OR;  Service: Orthopedics;  Laterality: Left;  . VAGINA SURGERY  2010   extra skin removed  . WISDOM TOOTH EXTRACTION      Family History   Problem Relation Age of Onset  . Hypertension Mother   . Stroke Mother   . Diabetes Father   . Hyperthyroidism Father   . Prostate cancer Father     Allergies  Allergen Reactions  . Peanut Oil Anaphylaxis    Slurred speech Slurred speech  . Codeine Nausea Only and Nausea And Vomiting    Current Outpatient Medications on File Prior to Visit  Medication Sig Dispense Refill  . acetaminophen (TYLENOL) 650 MG CR tablet Take 1 tablet by mouth at bedtime. And as needed    . atorvastatin (LIPITOR) 40 MG tablet Take 1 tablet (40 mg total) by mouth daily. For cholesterol. 90 tablet 3  . blood glucose meter kit and supplies KIT Dispense based on patient and insurance preference. Use up to four times daily as directed. (FOR ICD-9 250.00, 250.01). 1 each 0  . Blood Glucose Monitoring Suppl (FREESTYLE LITE) DEVI     . Cholecalciferol (VITAMIN D3) 5000 units CAPS Take 1 capsule by mouth daily.    . cyclobenzaprine (FLEXERIL) 10 MG tablet Take 1 tablet (10 mg total) by mouth at bedtime. 40 tablet 0  . fexofenadine (ALLEGRA) 180 MG tablet Take 180 mg by mouth daily.    . glucose blood (FREESTYLE LITE) test strip Use as instructed to test blood sugar up to 4 times daily 300 each 2  . Lancets (FREESTYLE) lancets     . lisinopril (ZESTRIL) 10 MG tablet TAKE 1 TABLET BY MOUTH ONCE A DAY FOR BLOOD PRESSURE 90 tablet 2  . meloxicam (MOBIC) 15 MG tablet Take 1 tablet (15 mg total) by mouth daily. 30 tablet 0  . metFORMIN (GLUCOPHAGE-XR) 500 MG 24 hr tablet TAKE 2 TABLETS BY MOUTH DAILY WITH BREAKFAST FOR DIABETES 180 tablet 0  . norethindrone (AYGESTIN) 5 MG tablet Take 1 tablet (5 mg total) by mouth daily. To initiate a cycle 30 tablet 1  . Omega-3 Fatty Acids (FISH OIL) 1200 MG CAPS 2 cap twice daily (Patient taking differently: 1 in am 2 in pm)    . Semaglutide,0.25 or 0.5MG/DOS, 2 MG/1.5ML SOPN Inject into the skin.    . vitamin B-12 (CYANOCOBALAMIN) 100 MCG tablet Take 1 capsule by mouth daily.      No current facility-administered medications on file prior to visit.    BP 124/70   Pulse (!) 102   Temp 97.8 F (36.6 C) (Temporal)   Ht 5' 6.75" (1.695 m)   Wt 188 lb (85.3 kg)   LMP 06/18/2020   SpO2 100%   BMI 29.67 kg/m    Objective:   Physical Exam Constitutional:      Appearance: She is well-nourished.  HENT:     Right Ear: Tympanic membrane and ear canal normal.     Left Ear: Tympanic membrane and ear canal normal.     Mouth/Throat:     Mouth: Oropharynx is clear and moist.  Eyes:     Extraocular Movements: EOM normal.     Pupils: Pupils are   equal, round, and reactive to light.  Cardiovascular:     Rate and Rhythm: Normal rate and regular rhythm.  Pulmonary:     Effort: Pulmonary effort is normal.     Breath sounds: Normal breath sounds.  Abdominal:     General: Bowel sounds are normal.     Palpations: Abdomen is soft.     Tenderness: There is no abdominal tenderness.  Musculoskeletal:        General: Normal range of motion.     Cervical back: Neck supple.  Skin:    General: Skin is warm and dry.  Neurological:     Mental Status: She is alert and oriented to person, place, and time.     Cranial Nerves: No cranial nerve deficit.     Deep Tendon Reflexes:     Reflex Scores:      Patellar reflexes are 2+ on the right side and 2+ on the left side. Psychiatric:        Mood and Affect: Mood and affect and mood normal.            Assessment & Plan:

## 2020-07-14 NOTE — Assessment & Plan Note (Signed)
Labs for rheumatoid arthritis negative in 2017, see care everywhere.

## 2020-07-14 NOTE — Assessment & Plan Note (Signed)
Well controlled on lisinopril 10 mg, CMP pending. Continue same.

## 2020-07-14 NOTE — Assessment & Plan Note (Signed)
Checking TSH again this year.  Lab pending.

## 2020-07-14 NOTE — Patient Instructions (Signed)
Stop by the lab prior to leaving today. I will notify you of your results once received.   We increase the dose of your Metformin XR to 2 tablets in the morning with breakfast and 2 tablets at night with dinner.  Continue to work on [employer]. Try to increase daily exercise when possible.  Please message me on my chart when you are ready for the colonoscopy referral.  Follow-up with endocrinology as scheduled.  It was a pleasure to see you today!   Preventive Care 21-45 Years Old, Female Preventive care refers to lifestyle choices and visits with your health care provider that can promote health and wellness. This includes:  A yearly physical exam. This is also called an annual wellness visit.  Regular dental and eye exams.  Immunizations.  Screening for certain conditions.  Healthy lifestyle choices, such as: ? Eating a healthy diet. ? Getting regular exercise. ? Not using drugs or products that contain nicotine and tobacco. ? Limiting alcohol use. What can I expect for my preventive care visit? Physical exam Your health care provider will check your:  Height and weight. These may be used to calculate your BMI (body mass index). BMI is a measurement that tells if you are at a healthy weight.  Heart rate and blood pressure.  Body temperature.  Skin for abnormal spots. Counseling Your health care provider may ask you questions about your:  Past medical problems.  Family's medical history.  Alcohol, tobacco, and drug use.  Emotional well-being.  Home life and relationship well-being.  Sexual activity.  Diet, exercise, and sleep habits.  Work and work Statistician.  Access to firearms.  Method of birth control.  Menstrual cycle.  Pregnancy history. What immunizations do I need? Vaccines are usually given at various ages, according to a schedule. Your health care provider will recommend vaccines for you based on your age, medical history, and  lifestyle or other factors, such as travel or where you work.   What tests do I need? Blood tests  Lipid and cholesterol levels. These may be checked every 5 years, or more often if you are over 57 years old.  Hepatitis C test.  Hepatitis B test. Screening  Lung cancer screening. You may have this screening every year starting at age 31 if you have a 30-pack-year history of smoking and currently smoke or have quit within the past 15 years.  Colorectal cancer screening. ? All adults should have this screening starting at age 51 and continuing until age 76. ? Your health care provider may recommend screening at age 48 if you are at increased risk. ? You will have tests every 1-10 years, depending on your results and the type of screening test.  Diabetes screening. ? This is done by checking your blood sugar (glucose) after you have not eaten for a while (fasting). ? You may have this done every 1-3 years.  Mammogram. ? This may be done every 1-2 years. ? Talk with your health care provider about when you should start having regular mammograms. This may depend on whether you have a family history of breast cancer.  BRCA-related cancer screening. This may be done if you have a family history of breast, ovarian, tubal, or peritoneal cancers.  Pelvic exam and Pap test. ? This may be done every 3 years starting at age 6. ? Starting at age 36, this may be done every 5 years if you have a Pap test in combination with an HPV test. Other  tests  STD (sexually transmitted disease) testing, if you are at risk.  Bone density scan. This is done to screen for osteoporosis. You may have this scan if you are at high risk for osteoporosis. Talk with your health care provider about your test results, treatment options, and if necessary, the need for more tests. Follow these instructions at home: Eating and drinking  Eat a diet that includes fresh fruits and vegetables, whole grains, lean protein,  and low-fat dairy products.  Take vitamin and mineral supplements as recommended by your health care provider.  Do not drink alcohol if: ? Your health care provider tells you not to drink. ? You are pregnant, may be pregnant, or are planning to become pregnant.  If you drink alcohol: ? Limit how much you have to 0-1 drink a day. ? Be aware of how much alcohol is in your drink. In the [place] one drink equals one 12 oz bottle of beer (355 mL), one 5 oz glass of wine (148 mL), or one 1 oz glass of hard liquor (44 mL).   Lifestyle  Take daily care of your teeth and gums. Brush your teeth every morning and night with fluoride toothpaste. Floss one time each day.  Stay active. Exercise for at least 30 minutes 5 or more days each week.  Do not use any products that contain nicotine or tobacco, such as cigarettes, e-cigarettes, and chewing tobacco. If you need help quitting, ask your health care provider.  Do not use drugs.  If you are sexually active, practice safe sex. Use a condom or other form of protection to prevent STIs (sexually transmitted infections).  If you do not wish to become pregnant, use a form of birth control. If you plan to become pregnant, see your health care provider for a prepregnancy visit.  If told by your health care provider, take low-dose aspirin daily starting at age 46.  Find healthy ways to cope with stress, such as: ? Meditation, yoga, or listening to music. ? Journaling. ? Talking to a trusted person. ? Spending time with friends and family. Safety  Always wear your seat belt while driving or riding in a vehicle.  Do not drive: ? If you have been drinking alcohol. Do not ride with someone who has been drinking. ? When you are tired or distracted. ? While texting.  Wear a helmet and other protective equipment during sports activities.  If you have firearms in your house, make sure you follow all gun safety procedures. What's next?  Visit your  health care provider once a year for an annual wellness visit.  Ask your health care provider how often you should have your eyes and teeth checked.  Stay up to date on all vaccines. This information is not intended to replace advice given to you by your health care provider. Make sure you discuss any questions you have with your health care provider. Document Revised: 01/28/2020 Document Reviewed: 01/04/2018 Elsevier Patient Education  2021 [employer].

## 2020-07-14 NOTE — Assessment & Plan Note (Signed)
Pending cervical spine fusion for next month, follow with neurosurgery.

## 2020-07-14 NOTE — Assessment & Plan Note (Signed)
Uncontrolled with A1C of 10.6, follows with endocrinology.  Cannot tolerate 0.5 mg dose of semaglutide due to GI side effects.  Recommend increase Metformin XR 500 to 2 tablets twice daily, she agrees.  Prescription sent to pharmacy.  She has an endocrinology appointment scheduled for May 2022, recommended follow-up at this time.

## 2020-07-14 NOTE — Assessment & Plan Note (Signed)
Compliant to atorvastatin 40 mg, repeat lipid panel pending today.

## 2020-07-14 NOTE — Assessment & Plan Note (Signed)
Immunizations up-to-date, plans on moving forward COVID booster. Pap smear up-to-date. Mammogram due in June 2022, ordered and pending. Colonoscopy due, she currently declines that she will undergo cervical spine fusion soon, she will update when ready and we will place referral at that time.  Encouraged healthy diet and regular exercise. Exam today stable Labs pending and reviewed.

## 2020-07-15 LAB — CBC
HCT: 38 % (ref 36.0–46.0)
Hemoglobin: 12.9 g/dL (ref 12.0–15.0)
MCHC: 34 g/dL (ref 30.0–36.0)
MCV: 90.6 fl (ref 78.0–100.0)
Platelets: 232 10*3/uL (ref 150.0–400.0)
RBC: 4.19 Mil/uL (ref 3.87–5.11)
RDW: 12.6 % (ref 11.5–15.5)
WBC: 12 10*3/uL — ABNORMAL HIGH (ref 4.0–10.5)

## 2020-07-15 LAB — LIPID PANEL
Cholesterol: 172 mg/dL (ref 0–200)
HDL: 37.7 mg/dL — ABNORMAL LOW (ref >39.00)
Total CHOL/HDL Ratio: 5
Triglycerides: 542 mg/dL — ABNORMAL HIGH (ref 0.0–149.0)

## 2020-07-15 LAB — VITAMIN D 25 HYDROXY (VIT D DEFICIENCY, FRACTURES): VITD: 80.75 ng/mL (ref 30.00–100.00)

## 2020-07-15 LAB — COMPREHENSIVE METABOLIC PANEL
ALT: 36 U/L — ABNORMAL HIGH (ref 0–35)
AST: 27 U/L (ref 0–37)
Albumin: 4.3 g/dL (ref 3.5–5.2)
Alkaline Phosphatase: 34 U/L — ABNORMAL LOW (ref 39–117)
BUN: 16 mg/dL (ref 6–23)
CO2: 26 mEq/L (ref 19–32)
Calcium: 10.1 mg/dL (ref 8.4–10.5)
Chloride: 96 mEq/L (ref 96–112)
Creatinine, Ser: 1.03 mg/dL (ref 0.40–1.20)
GFR: 64.79 mL/min (ref >60.00)
Glucose, Bld: 210 mg/dL — ABNORMAL HIGH (ref 70–99)
Potassium: 4.2 mEq/L (ref 3.5–5.1)
Sodium: 134 mEq/L — ABNORMAL LOW (ref 135–145)
Total Bilirubin: 1.6 mg/dL — ABNORMAL HIGH (ref 0.2–1.2)
Total Protein: 7.4 g/dL (ref 6.0–8.3)

## 2020-07-15 LAB — IBC + FERRITIN
Ferritin: 96.9 ng/mL (ref 10.0–291.0)
Iron: 87 ug/dL (ref 42–145)
Saturation Ratios: 22.2 % (ref 20.0–50.0)
Transferrin: 280 mg/dL (ref 212.0–360.0)

## 2020-07-15 LAB — LDL CHOLESTEROL, DIRECT: Direct LDL: 69 mg/dL

## 2020-07-15 LAB — TSH: TSH: 2.26 u[IU]/mL (ref 0.35–4.50)

## 2020-07-16 ENCOUNTER — Ambulatory Visit (INDEPENDENT_AMBULATORY_CARE_PROVIDER_SITE_OTHER): Payer: 59 | Admitting: Family Medicine

## 2020-07-16 ENCOUNTER — Encounter: Payer: Self-pay | Admitting: Family Medicine

## 2020-07-16 ENCOUNTER — Other Ambulatory Visit: Payer: Self-pay

## 2020-07-16 VITALS — BP 143/86 | HR 113 | Ht 66.0 in | Wt 188.0 lb

## 2020-07-16 DIAGNOSIS — N814 Uterovaginal prolapse, unspecified: Secondary | ICD-10-CM | POA: Insufficient documentation

## 2020-07-16 NOTE — Assessment & Plan Note (Addendum)
Onset last year, worse with COVID, has h/o anterior and posterior repair with some sort of sling in the past. She is not leaking urine, but given prior surgery, as well as level of prolapse, think uro/gyn is best bet, likely to need some sort of vaginal vault suspension procedure. Will refer.

## 2020-07-16 NOTE — Progress Notes (Signed)
   Subjective:    Patient ID: Erin Marquez is a 50 y.o. female presenting with possible prolapse  on 07/16/2020  HPI: Notices something is popping through her vagina with coughing and sneezing and when bending over. Reports bladder tacking + sling following her last childbirth. Notes some swelling since January. H/o SVD x 2, largest baby almost 10 lbs and "got stuck". She is not losing urine at present.  Review of Systems  Constitutional: Negative for chills and fever.  Respiratory: Negative for shortness of breath.   Cardiovascular: Negative for chest pain.  Gastrointestinal: Negative for abdominal pain, nausea and vomiting.  Genitourinary: Negative for dysuria.  Skin: Negative for rash.      Objective:    BP (!) 143/86   Pulse (!) 113   Ht 5\' 6"  (1.676 m)   Wt 188 lb (85.3 kg)   LMP 06/18/2020   BMI 30.34 kg/m  Physical Exam Constitutional:      General: She is not in acute distress.    Appearance: She is well-developed.  HENT:     Head: Normocephalic and atraumatic.  Eyes:     General: No scleral icterus. Cardiovascular:     Rate and Rhythm: Normal rate.  Pulmonary:     Effort: Pulmonary effort is normal.  Abdominal:     Palpations: Abdomen is soft.  Genitourinary:    Comments: In lithotomy, with valsalva, the cervix comes past the introitus. Anteriorly the bladder is well supported. There is some bulging further back anteriorly. Posteriorly I do not see a rectocele. Musculoskeletal:     Cervical back: Neck supple.  Skin:    General: Skin is warm and dry.  Neurological:     Mental Status: She is alert and oriented to person, place, and time.         Assessment & Plan:   Problem List Items Addressed This Visit      Unprioritized   Uterine prolapse - Primary    Onset last year, worse with COVID, has h/o anterior and posterior repair with some sort of sling in the past. She is not leaking urine, but given prior surgery, as well as level of prolapse, think  uro/gyn is best bet, likely to need some sort of vaginal vault suspension procedure. Will refer.      Relevant Orders   Ambulatory referral to Urogynecology      Total time in review of prior notes from previous GYN MD,history taking, review with patient, exam, note writing, discussion of options, she strongly desires [place], which I do not do but would likely be necessary for vaginal vault suspension, discussed her prior surgeries, and meaning of prolapse, causes, etc.: 32 minutes.  Return if symptoms worsen or fail to improve.  [name] 07/16/2020 2:53 PM

## 2020-07-17 ENCOUNTER — Other Ambulatory Visit: Payer: Self-pay | Admitting: Primary Care

## 2020-07-17 DIAGNOSIS — E1169 Type 2 diabetes mellitus with other specified complication: Secondary | ICD-10-CM

## 2020-07-17 DIAGNOSIS — I152 Hypertension secondary to endocrine disorders: Secondary | ICD-10-CM

## 2020-07-17 DIAGNOSIS — E1159 Type 2 diabetes mellitus with other circulatory complications: Secondary | ICD-10-CM

## 2020-07-17 DIAGNOSIS — E782 Mixed hyperlipidemia: Secondary | ICD-10-CM

## 2020-07-17 MED FILL — LISINOPRIL 10 MG TABS: 10 | 90 days supply | Qty: 90 | Fill #0

## 2020-07-17 MED FILL — ATORVASTATIN 40 MG TABLET: 40 | 90 days supply | Qty: 90 | Fill #0

## 2020-07-22 ENCOUNTER — Other Ambulatory Visit (HOSPITAL_COMMUNITY): Payer: Self-pay | Admitting: Endocrinology

## 2020-07-24 ENCOUNTER — Other Ambulatory Visit (HOSPITAL_COMMUNITY): Payer: Self-pay | Admitting: Neurosurgery

## 2020-07-24 DIAGNOSIS — M5412 Radiculopathy, cervical region: Secondary | ICD-10-CM | POA: Diagnosis not present

## 2020-07-24 DIAGNOSIS — M50122 Cervical disc disorder at C5-C6 level with radiculopathy: Secondary | ICD-10-CM | POA: Diagnosis not present

## 2020-07-24 DIAGNOSIS — M2578 Osteophyte, vertebrae: Secondary | ICD-10-CM | POA: Diagnosis not present

## 2020-07-24 DIAGNOSIS — M4712 Other spondylosis with myelopathy, cervical region: Secondary | ICD-10-CM | POA: Diagnosis not present

## 2020-09-01 DIAGNOSIS — M5412 Radiculopathy, cervical region: Secondary | ICD-10-CM | POA: Diagnosis not present

## 2020-09-01 NOTE — Progress Notes (Signed)
[facility] New Patient Evaluation and Consultation  Referring Provider: [name], MD PCP: Pleas Koch, NP Date of Service: 09/02/2020  SUBJECTIVE Chief Complaint: New Patient (Initial Visit) (prolapse)  History of Present Illness: Erin Marquez is a 50 y.o. White or Caucasian female seen in consultation at the request of Dr. Kennon Rounds for evaluation of prolapse.    Review of records from Dr Kennon Rounds significant for: History of A/P repair and sling. Cervix noted prolapsing past the introitus.   Urinary Symptoms: Does not leak urine regularly- occasionally with have leakage with cough or sneeze.  Pad use: 1 liners/ mini-pads per day.   She is not bothered by her UI symptoms.  Day time voids- normal, every few hours.  Nocturia: 1 times per night to void. Voiding dysfunction: she empties her bladder well.  does not use a catheter to empty bladder.  When urinating, she feels a weak stream  UTIs: 0 UTI's in the last year.   Reports history of kidney or bladder stones  Pelvic Organ Prolapse Symptoms:                  She Admits to a feeling of a bulge the vaginal area.Has been present since Nov 2021 She Admits to seeing a bulge.  This bulge is bothersome. Had a history of "excess skin" and had vaginoplasty in 2020 with Dr Kennon Rounds.  Also had surgery around 2008 for "excess skin" and sling.  Has a feeling of something "popping through" when she bends over. Noticed it more with coughing.   Bowel Symptom: Bowel movements: 1-2 time(s) per day Stool consistency: soft  or loose Straining: no.  Splinting: no.  Incomplete evacuation: no.  She Denies accidental bowel leakage / fecal incontinence Bowel regimen: none   Sexual Function Sexually active: yes.  Pain with sex: Yes, at the vaginal opening  Pelvic Pain Denies pelvic pain   Past Medical History:  Past Medical History:  Diagnosis Date  . Anxiety   . Arthritis   . Depression    PMH  . Diabetes mellitus  without complication ([place])    type 2  . Dog bite    left hand  . History of kidney stones   . Hyperlipidemia   . Hypertension   . Polycystic ovarian syndrome   . Seasonal allergies      Past Surgical History:   Past Surgical History:  Procedure Laterality Date  . CERVICAL FUSION  07/2020  . CYSTOSCOPY/URETEROSCOPY/HOLMIUM LASER/STENT PLACEMENT Left 03/22/2019   Procedure: CYSTOSCOPY/URETEROSCOPY/STENT PLACEMENT/ BASKET STONE EXTRACTION/ LEFT RETROGRADE PYELOGRAM;  Surgeon: Ceasar Mons, MD;  Location: [facility];  Service: Urology;  Laterality: Left;  . HYMENECTOMY N/A 12/05/2018   Procedure: Vaginoplasty;  Surgeon: [name], MD;  Location: [place];  Service: Gynecology;  Laterality: N/A;  Please have four 2-0 Vicryl on a CT-1 and 76ms of 0.25% Marcaine  . I & D EXTREMITY Left 08/10/2019   Procedure: IRRIGATION AND DEBRIDEMENT LEFT SMALL FINGER AND LEFT HAND WOUNDS, PERCUTANEOUS PINNING LEFT SMALL FINGER;  Surgeon: CVerner Mould MD;  [place] [facility]  Service: Orthopedics;  Laterality: Left;  .Marland KitchenVAGINA SURGERY  2010   extra skin removed  . WISDOM TOOTH EXTRACTION       Past OB/GYN History: G2 P2 Vaginal deliveries: 2,  Forceps/ Vacuum deliveries: 0, Cesarean section: 0 Menopausal: irregular periods Contraception: none. Last pap smear was 12/2019- negative.  Any history of abnormal pap smears:  no.   Medications: She has a current medication list which includes the following prescription(s): acetaminophen, atorvastatin, blood glucose meter kit and supplies, freestyle lite, vitamin d3, fexofenadine, glipizide, glucose blood, freestyle, lisinopril, meloxicam, metformin, norethindrone, fish oil, and vitamin b-12.   Allergies: Patient is allergic to peanut oil and codeine.   Social History:  Social History   Tobacco Use  . Smoking status: Never Smoker  . Smokeless tobacco: Never Used  Vaping Use  . Vaping Use: Never  used  Substance Use Topics  . Alcohol use: Yes    Alcohol/week: 1.0 standard drink    Types: 1 Standard drinks or equivalent per week    Comment: Monthly  . Drug use: No    Relationship status: married She lives with husband and chidlren.   She is not employed. Regular exercise: Yes: walking History of abuse: No  Family History:   Family History  Problem Relation Age of Onset  . Hypertension Mother   . Stroke Mother   . Diabetes Father   . Hyperthyroidism Father   . Prostate cancer Father   . Anxiety disorder Daughter      Review of Systems: Review of Systems  Constitutional: Positive for malaise/fatigue. Negative for fever and weight loss.  Respiratory: Negative for cough, shortness of breath and wheezing.   Cardiovascular: Negative for chest pain, palpitations and leg swelling.  Gastrointestinal: Negative for abdominal pain and blood in stool.  Genitourinary: Negative for dysuria.  Musculoskeletal: Negative for myalgias.  Skin: Negative for rash.  Neurological: Negative for dizziness and headaches.  Endo/Heme/Allergies: Does not bruise/bleed easily.  Psychiatric/Behavioral: Negative for depression. The patient is nervous/anxious.      OBJECTIVE Physical Exam: Vitals:   09/02/20 1422  BP: 113/77  Pulse: 82  Weight: 188 lb (85.3 kg)  Height: '5\' 5"'  (1.651 m)    Physical Exam Constitutional:      General: She is not in acute distress. Pulmonary:     Effort: Pulmonary effort is normal.  Abdominal:     General: There is no distension.     Palpations: Abdomen is soft.     Tenderness: There is no abdominal tenderness. There is no rebound.  Musculoskeletal:        General: No swelling. Normal range of motion.  Skin:    General: Skin is warm and dry.     Findings: No rash.  Neurological:     Mental Status: She is alert and oriented to person, place, and time.  Psychiatric:        Mood and Affect: Mood normal.        Behavior: Behavior normal.      GU  / Detailed Urogynecologic Evaluation:  Pelvic Exam: Normal external female genitalia; Bartholin's and Skene's glands normal in appearance; urethral meatus normal in appearance, no urethral masses or discharge.   CST: negative  Speculum exam reveals normal vaginal mucosa without atrophy. Cervix normal appearance. Uterus normal single, nontender. Adnexa no mass, fullness, tenderness.    Pelvic floor strength II/V  Pelvic floor musculature: Right levator non-tender, Right obturator non-tender, Left levator non-tender, Left obturator non-tender  POP-Q:   POP-Q  -0.5                                            Aa   -0.5  Ba  0                                              C   4.5                                            Gh  5                                            Pb  8.5                                            tvl   -2                                            Ap  -2                                            Bp  -3.5                                              D     Rectal Exam:  Normal external rectum  Post-Void Residual (PVR) by Bladder Scan: In order to evaluate bladder emptying, we discussed obtaining a postvoid residual and she agreed to this procedure.  Procedure: The ultrasound unit was placed on the patient's abdomen in the suprapubic region after the patient had voided. A PVR of 7 ml was obtained by bladder scan.  Laboratory Results: POC urine: negative  I visualized the urine specimen, noting the specimen to be clear yellow  ASSESSMENT AND PLAN Ms. Linders is a 50 y.o. with:  1. Uterovaginal prolapse, incomplete   2. Prolapse of anterior vaginal wall   3. Prolapse of posterior vaginal wall   4. Urinary frequency     1. Stage II anterior, Stage I posterior, Stage II apical prolapse For treatment of pelvic organ prolapse, we discussed options for management including expectant management, conservative  management, and surgical management, such as Kegels, a pessary, pelvic floor physical therapy, and specific surgical procedures. - She is interested in surgery. We discussed two options for prolapse repair:   1) vaginal repair without mesh  - Pros - safer, no mesh complications  - Cons - not as strong as mesh repair, higher risk of recurrence    2) laparoscopic repair with mesh  - Pros - stronger, better long-term success  - Cons - risks of mesh implant (erosion into vagina or bladder, adhering to the rectum, pain) - these risks are lower than with a vaginal mesh but still exist  - She is more interested in Robotic hysterectomy with sacrocolpopexy, and handouts  were provided about this procedure. We discussed that the next procedure date available is in September. She declined referral to another urogynecologist in the area. Will plan for this date. She will need a pre-op visit within 30 days of the procedure.  - Recent A1c was 7.3, will plan to repeat prior to the procedure. Needs to be < 8 to proceed with surgery.   2. SUI - We discussed that although she has had a sling in the past and has rare stress incontinence now, this may worsen after prolapse repair. She does not feel that she wants this addressed at this time and would prefer interval repeat sling if leakage worsens after surgery.   3. Nocturia/ frequency - not bothersome at this time.   Will plan for prolapse surgery and have her return for pre-op closer to surgery date.   Jaquita Folds, MD   Medical Decision Making:  - Reviewed/ ordered a clinical laboratory test - Review and summation of prior records

## 2020-09-02 ENCOUNTER — Ambulatory Visit (INDEPENDENT_AMBULATORY_CARE_PROVIDER_SITE_OTHER): Payer: 59 | Admitting: Obstetrics and Gynecology

## 2020-09-02 ENCOUNTER — Other Ambulatory Visit: Payer: Self-pay

## 2020-09-02 ENCOUNTER — Encounter: Payer: Self-pay | Admitting: Obstetrics and Gynecology

## 2020-09-02 VITALS — BP 113/77 | HR 82 | Ht 65.0 in | Wt 188.0 lb

## 2020-09-02 DIAGNOSIS — N816 Rectocele: Secondary | ICD-10-CM

## 2020-09-02 DIAGNOSIS — N8110 Cystocele, unspecified: Secondary | ICD-10-CM | POA: Diagnosis not present

## 2020-09-02 DIAGNOSIS — R350 Frequency of micturition: Secondary | ICD-10-CM

## 2020-09-02 DIAGNOSIS — N812 Incomplete uterovaginal prolapse: Secondary | ICD-10-CM | POA: Diagnosis not present

## 2020-09-02 DIAGNOSIS — N393 Stress incontinence (female) (male): Secondary | ICD-10-CM

## 2020-09-02 LAB — POCT URINALYSIS DIPSTICK
Appearance: NORMAL
Bilirubin, UA: NEGATIVE
Glucose, UA: NEGATIVE
Ketones, UA: NEGATIVE
Leukocytes, UA: NEGATIVE
Nitrite, UA: NEGATIVE
Protein, UA: NEGATIVE
Spec Grav, UA: 1.025 (ref 1.010–1.025)
Urobilinogen, UA: 0.2 E.U./dL
pH, UA: 5 (ref 5.0–8.0)

## 2020-09-02 NOTE — Patient Instructions (Signed)
You have a stage 2 (out of 4) prolapse.  We discussed the fact that it is not life threatening but there are several treatment options. For treatment of pelvic organ prolapse, we discussed options for management including expectant management, conservative management, and surgical management, such as Kegels, a pessary, pelvic floor physical therapy, and specific surgical procedures.    

## 2020-09-03 ENCOUNTER — Encounter: Payer: Self-pay | Admitting: Obstetrics and Gynecology

## 2020-09-03 ENCOUNTER — Other Ambulatory Visit: Payer: Self-pay | Admitting: *Deleted

## 2020-09-03 DIAGNOSIS — N814 Uterovaginal prolapse, unspecified: Secondary | ICD-10-CM

## 2020-09-07 DIAGNOSIS — Z7984 Long term (current) use of oral hypoglycemic drugs: Secondary | ICD-10-CM | POA: Diagnosis not present

## 2020-09-07 DIAGNOSIS — Z79899 Other long term (current) drug therapy: Secondary | ICD-10-CM | POA: Diagnosis not present

## 2020-09-07 DIAGNOSIS — E1165 Type 2 diabetes mellitus with hyperglycemia: Secondary | ICD-10-CM | POA: Diagnosis not present

## 2020-09-17 DIAGNOSIS — N393 Stress incontinence (female) (male): Secondary | ICD-10-CM | POA: Diagnosis not present

## 2020-09-17 DIAGNOSIS — N812 Incomplete uterovaginal prolapse: Secondary | ICD-10-CM | POA: Diagnosis not present

## 2020-10-08 ENCOUNTER — Other Ambulatory Visit (HOSPITAL_COMMUNITY): Payer: Self-pay

## 2020-10-08 MED FILL — Glipizide Tab 5 MG: ORAL | 90 days supply | Qty: 90 | Fill #0 | Status: AC

## 2020-10-12 DIAGNOSIS — H2513 Age-related nuclear cataract, bilateral: Secondary | ICD-10-CM | POA: Diagnosis not present

## 2020-10-12 DIAGNOSIS — H02402 Unspecified ptosis of left eyelid: Secondary | ICD-10-CM | POA: Diagnosis not present

## 2020-10-12 DIAGNOSIS — E119 Type 2 diabetes mellitus without complications: Secondary | ICD-10-CM | POA: Diagnosis not present

## 2020-10-12 LAB — HM DIABETES EYE EXAM

## 2020-10-13 DIAGNOSIS — M5412 Radiculopathy, cervical region: Secondary | ICD-10-CM | POA: Diagnosis not present

## 2020-10-19 DIAGNOSIS — N814 Uterovaginal prolapse, unspecified: Secondary | ICD-10-CM | POA: Diagnosis not present

## 2020-10-21 DIAGNOSIS — N812 Incomplete uterovaginal prolapse: Secondary | ICD-10-CM | POA: Diagnosis not present

## 2020-10-21 DIAGNOSIS — N393 Stress incontinence (female) (male): Secondary | ICD-10-CM | POA: Diagnosis not present

## 2020-10-23 ENCOUNTER — Other Ambulatory Visit (HOSPITAL_COMMUNITY): Payer: Self-pay

## 2020-10-23 MED FILL — Atorvastatin Calcium Tab 40 MG (Base Equivalent): ORAL | 90 days supply | Qty: 90 | Fill #0 | Status: AC

## 2020-10-23 MED FILL — Lisinopril Tab 10 MG: ORAL | 90 days supply | Qty: 90 | Fill #0 | Status: AC

## 2020-10-27 ENCOUNTER — Other Ambulatory Visit: Payer: Self-pay

## 2020-10-27 ENCOUNTER — Ambulatory Visit
Admission: RE | Admit: 2020-10-27 | Discharge: 2020-10-27 | Disposition: A | Discharge status: Home / Self Care | Payer: 59 | Source: Ambulatory Visit | Attending: Primary Care | Admitting: Primary Care

## 2020-10-27 DIAGNOSIS — Z1231 Encounter for screening mammogram for malignant neoplasm of breast: Secondary | ICD-10-CM | POA: Diagnosis not present

## 2020-10-28 ENCOUNTER — Encounter: Payer: Self-pay | Admitting: Primary Care

## 2020-10-30 ENCOUNTER — Other Ambulatory Visit (HOSPITAL_COMMUNITY): Payer: Self-pay

## 2020-10-30 DIAGNOSIS — M5030 Other cervical disc degeneration, unspecified cervical region: Secondary | ICD-10-CM | POA: Diagnosis not present

## 2020-10-30 DIAGNOSIS — N814 Uterovaginal prolapse, unspecified: Secondary | ICD-10-CM | POA: Diagnosis not present

## 2020-10-30 DIAGNOSIS — Z6832 Body mass index (BMI) 32.0-32.9, adult: Secondary | ICD-10-CM | POA: Diagnosis not present

## 2020-10-30 DIAGNOSIS — E669 Obesity, unspecified: Secondary | ICD-10-CM | POA: Diagnosis not present

## 2020-10-30 DIAGNOSIS — E785 Hyperlipidemia, unspecified: Secondary | ICD-10-CM | POA: Diagnosis not present

## 2020-10-30 DIAGNOSIS — E119 Type 2 diabetes mellitus without complications: Secondary | ICD-10-CM | POA: Diagnosis not present

## 2020-10-30 DIAGNOSIS — N812 Incomplete uterovaginal prolapse: Secondary | ICD-10-CM | POA: Diagnosis not present

## 2020-10-30 DIAGNOSIS — I10 Essential (primary) hypertension: Secondary | ICD-10-CM | POA: Diagnosis not present

## 2020-10-30 DIAGNOSIS — Z79899 Other long term (current) drug therapy: Secondary | ICD-10-CM | POA: Diagnosis not present

## 2020-10-30 DIAGNOSIS — Z7984 Long term (current) use of oral hypoglycemic drugs: Secondary | ICD-10-CM | POA: Diagnosis not present

## 2020-10-30 DIAGNOSIS — N816 Rectocele: Secondary | ICD-10-CM | POA: Diagnosis not present

## 2020-10-30 DIAGNOSIS — N888 Other specified noninflammatory disorders of cervix uteri: Secondary | ICD-10-CM | POA: Diagnosis not present

## 2020-10-30 MED ORDER — PREMARIN 0.625 MG PO TABS
ORAL_TABLET | ORAL | 2 refills | Status: DC
  Filled 2020-10-30: qty 30, 30d supply, fill #0

## 2020-10-30 MED ORDER — ACETAMINOPHEN 325 MG PO TABS: ORAL_TABLET | ORAL | 2 refills | Status: DC

## 2020-10-30 MED ORDER — TRAMADOL HCL 50 MG PO TABS
ORAL_TABLET | ORAL | 0 refills | Status: DC
  Filled 2020-10-30: qty 30, 8d supply, fill #0

## 2020-10-30 MED ORDER — IBUPROFEN 600 MG PO TABS
600.0000 mg | ORAL_TABLET | Freq: Four times a day (QID) | ORAL | 2 refills | Status: DC
  Filled 2020-10-30: qty 60, 15d supply, fill #0

## 2020-11-05 ENCOUNTER — Other Ambulatory Visit (HOSPITAL_COMMUNITY): Payer: Self-pay

## 2020-11-05 DIAGNOSIS — N814 Uterovaginal prolapse, unspecified: Secondary | ICD-10-CM | POA: Diagnosis not present

## 2020-11-05 DIAGNOSIS — R82998 Other abnormal findings in urine: Secondary | ICD-10-CM | POA: Diagnosis not present

## 2020-11-05 DIAGNOSIS — R310 Gross hematuria: Secondary | ICD-10-CM | POA: Diagnosis not present

## 2020-11-05 DIAGNOSIS — D7289 Other specified disorders of white blood cells: Secondary | ICD-10-CM | POA: Diagnosis not present

## 2020-11-05 DIAGNOSIS — N813 Complete uterovaginal prolapse: Secondary | ICD-10-CM | POA: Diagnosis not present

## 2020-11-05 MED ORDER — ESTRADIOL 0.1 MG/GM VA CREA
TOPICAL_CREAM | VAGINAL | 3 refills | Status: DC
  Filled 2020-11-05: qty 42.5, 30d supply, fill #0

## 2020-11-06 HISTORY — PX: HYSTERECTOMY ABDOMINAL WITH SALPINGECTOMY: SHX6725

## 2020-11-20 ENCOUNTER — Encounter: Payer: Self-pay | Admitting: Radiology

## 2020-11-23 ENCOUNTER — Other Ambulatory Visit: Payer: Self-pay

## 2020-11-23 ENCOUNTER — Other Ambulatory Visit (HOSPITAL_COMMUNITY): Payer: Self-pay

## 2020-11-23 MED FILL — Semaglutide Soln Pen-inj 0.25 or 0.5 MG/DOSE (2 MG/1.5ML): SUBCUTANEOUS | 84 days supply | Qty: 4.5 | Fill #0 | Status: AC

## 2020-12-10 DIAGNOSIS — N812 Incomplete uterovaginal prolapse: Secondary | ICD-10-CM | POA: Diagnosis not present

## 2020-12-10 DIAGNOSIS — N814 Uterovaginal prolapse, unspecified: Secondary | ICD-10-CM | POA: Diagnosis not present

## 2020-12-10 DIAGNOSIS — R82998 Other abnormal findings in urine: Secondary | ICD-10-CM | POA: Diagnosis not present

## 2020-12-22 ENCOUNTER — Encounter: Payer: Self-pay | Admitting: Obstetrics and Gynecology

## 2020-12-22 ENCOUNTER — Ambulatory Visit (INDEPENDENT_AMBULATORY_CARE_PROVIDER_SITE_OTHER): Payer: 59 | Admitting: Obstetrics and Gynecology

## 2020-12-22 ENCOUNTER — Other Ambulatory Visit (HOSPITAL_COMMUNITY): Payer: Self-pay

## 2020-12-22 ENCOUNTER — Other Ambulatory Visit: Payer: Self-pay | Admitting: Obstetrics and Gynecology

## 2020-12-22 ENCOUNTER — Other Ambulatory Visit: Payer: Self-pay

## 2020-12-22 VITALS — BP 158/88 | HR 98 | Ht 65.0 in | Wt 192.0 lb

## 2020-12-22 DIAGNOSIS — N761 Subacute and chronic vaginitis: Secondary | ICD-10-CM

## 2020-12-22 MED ORDER — METRONIDAZOLE 500 MG PO TABS
500.0000 mg | ORAL_TABLET | Freq: Two times a day (BID) | ORAL | 0 refills | Status: DC
  Filled 2020-12-22: qty 14, 7d supply, fill #0

## 2020-12-22 MED ORDER — FLUCONAZOLE 150 MG PO TABS
150.0000 mg | ORAL_TABLET | ORAL | 0 refills | Status: AC
  Filled 2020-12-22: qty 2, 6d supply, fill #0

## 2020-12-22 NOTE — Progress Notes (Signed)
Obstetrics and Gynecology Visit Return Patient Evaluation  Appointment Date: 12/22/2020  Primary Care Provider: Pleas Koch  OBGYN Clinic: [facility]  Chief Complaint: chronic vulvovaginitis  History of Present Illness:  Erin Marquez is a 50 y.o. with above CC. Patient had a TVH/BS/b/l USLS/posterior repair on 6/24 by [place] [facility].  Patient states she's had this issue prior to surgery but thought it was due to the prolapse but s/s have been continuing post op, stable (no better or worse). Post op, she has been on vaginal estrace cream by her Urogyn and no affect on the s/s. She states the s/s are more so external and feels more irritated and not really itchy. She saw her Urogyn 8/4 and she states she was told to continue the vaginal estrogen indefinitely and to RTC in one year and was advised topical cortisone for the itching.  She has used the topical hydrocortisone but has used OTC benozocaine with no real affect.   Review of Systems: as noted in the History of Present Illness.   Patient Active Problem List   Diagnosis Date Noted   Uterine prolapse 07/16/2020   Chronic neck pain 07/14/2020   Decreased renal function 07/10/2019   Preventative health care 07/10/2019   Rash and nonspecific skin eruption 12/27/2018   Otalgia 11/15/2018   Abnormal TSH 07/04/2018   Seborrheic keratosis 09/06/2017   PCOS (polycystic ovarian syndrome) 06/13/2017   Type 2 diabetes mellitus with complication, without long-term current use of insulin ([place]) 06/13/2017   Hypertension associated with diabetes ([place]) 06/13/2017   Mixed diabetic hyperlipidemia associated with type 2 diabetes mellitus ([place]) 06/13/2017   Vitamin D deficiency 06/13/2017   Environmental and seasonal allergies 06/13/2017   Chronic Arthralgias- bilateral hands and feet 06/13/2017   Family history of rheumatoid arthritis 06/13/2017   SK (solar keratosis)- two on face 06/13/2017   Elevated  liver enzymes- ALT and AST since  06/13/2017   Depression, recurrent ([place]) 06/16/2014   Sciatica 05/13/2014   Medications:  Franne Grip. [place] had no medications administered during this visit. Current Outpatient Medications  Medication Sig Dispense Refill   acetaminophen (TYLENOL) 650 MG CR tablet Take 1 tablet by mouth at bedtime. And as needed     atorvastatin (LIPITOR) 40 MG tablet TAKE 1 TABLET BY MOUTH DAILY. FOR CHOLESTEROL. 90 tablet 3   Cholecalciferol (VITAMIN D3) 5000 units CAPS Take 1 capsule by mouth daily.     cyclobenzaprine (FLEXERIL) 10 MG tablet Take 10 mg by mouth 3 (three) times daily as needed for muscle spasms.     estradiol (ESTRACE) 0.1 MG/GM vaginal cream Place a pea-sized amount in the vagina nightly X 2 weeks, then use every other night 42 g 3   fexofenadine (ALLEGRA) 180 MG tablet Take 180 mg by mouth daily.     glipiZIDE (GLUCOTROL) 5 MG tablet TAKE 1 TABLET (5 MG TOTAL) BY MOUTH DAILY. 180 tablet 3   ibuprofen (ADVIL) 600 MG tablet Take 1 tablet by mouth every 6 hours 60 tablet 2   lisinopril (ZESTRIL) 10 MG tablet TAKE 1 TABLET BY MOUTH ONCE A DAY FOR BLOOD PRESSURE 90 tablet 3   meloxicam (MOBIC) 15 MG tablet Take 1 tablet (15 mg total) by mouth daily. 30 tablet 0   metFORMIN (GLUCOPHAGE-XR) 500 MG 24 hr tablet TAKE 2 TABLETS BY MOUTH 2 TIMES DAILY WITH A MEAL FOR DIABETES 360 tablet 3   Omega-3 Fatty Acids (FISH OIL) 1200 MG CAPS 2 cap twice  daily (Patient taking differently: 1 in am 2 in pm)     Semaglutide,0.25 or 0.5MG/DOS, 2 MG/1.5ML SOPN INJECT 0.5 MG TOTAL INTO THE SKIN ONCE A WEEK. 4.5 mL 3   vitamin B-12 (CYANOCOBALAMIN) 100 MCG tablet Take 1 capsule by mouth daily.     acetaminophen (TYLENOL) 325 MG tablet Take 2 tablets by mouth every 6 hours 60 tablet 2   blood glucose meter kit and supplies KIT Dispense based on patient and insurance preference. Use up to four times daily as directed. (FOR ICD-9 250.00, 250.01). 1 each 0   Blood Glucose Monitoring Suppl  (FREESTYLE LITE) DEVI      glucose blood (FREESTYLE LITE) test strip Use as instructed to test blood sugar up to 4 times daily 300 each 2   Lancets (FREESTYLE) lancets      traMADol (ULTRAM) 50 MG tablet Take 1 tablet  by mouth every 6  hours as needed for up to 8 days for moderate pain (4-6). (Patient not taking: Reported on 12/22/2020) 30 tablet 0   No current facility-administered medications for this visit.    Allergies: is allergic to peanut oil and codeine.  Physical Exam:  BP (!) 158/88   Pulse 98   Ht 5' 5" (1.651 m)   Wt 192 lb (87.1 kg)   LMP 10/27/2020   BMI 31.95 kg/m  Body mass index is 31.95 kg/m. General appearance: Well nourished, well developed female in no acute distress.  Abdomen: diffusely non tender to palpation, non distended, and no masses, hernias Neuro/Psych:  Normal mood and affect.    Pelvic exam:  EGBUS: normal with mild b/l erythema from the lower mons to the rectum  vaginal vault and cervix: within normal limits   Assessment: pt stable  Plan:  1. Chronic vaginitis F/u more specific swab. Diflucan and flagyl sent in. I told her that good CBG control is very helpful to control future s/s, and I told her to touch base with her urogyn to see how long to be on topical estrogen for  - NuSwab Vaginitis Plus (VG+)   RTC: f/u after swab  Durene Romans MD Attending Center for [facility])

## 2020-12-27 LAB — NUSWAB VAGINITIS PLUS (VG+)
Candida albicans, NAA: NEGATIVE
Candida glabrata, NAA: NEGATIVE
Chlamydia trachomatis, NAA: NEGATIVE
Neisseria gonorrhoeae, NAA: NEGATIVE
Trich vag by NAA: NEGATIVE

## 2021-01-13 ENCOUNTER — Other Ambulatory Visit (HOSPITAL_COMMUNITY): Payer: Self-pay

## 2021-01-13 ENCOUNTER — Other Ambulatory Visit: Payer: Self-pay | Admitting: Obstetrics and Gynecology

## 2021-01-13 MED ORDER — NYSTATIN-TRIAMCINOLONE 100000-0.1 UNIT/GM-% EX OINT
1.0000 "application " | TOPICAL_OINTMENT | Freq: Two times a day (BID) | CUTANEOUS | 0 refills | Status: AC
  Filled 2021-01-13: qty 30, 14d supply, fill #0

## 2021-01-14 ENCOUNTER — Other Ambulatory Visit (HOSPITAL_COMMUNITY): Payer: Self-pay

## 2021-01-19 MED FILL — Glipizide Tab 5 MG: ORAL | 90 days supply | Qty: 90 | Fill #1 | Status: AC

## 2021-01-20 ENCOUNTER — Other Ambulatory Visit (HOSPITAL_COMMUNITY): Payer: Self-pay

## 2021-01-24 ENCOUNTER — Other Ambulatory Visit (HOSPITAL_COMMUNITY): Payer: Self-pay

## 2021-01-24 MED FILL — Atorvastatin Calcium Tab 40 MG (Base Equivalent): ORAL | 90 days supply | Qty: 90 | Fill #1 | Status: AC

## 2021-01-24 MED FILL — Metformin HCl Tab ER 24HR 500 MG: ORAL | 90 days supply | Qty: 360 | Fill #0 | Status: AC

## 2021-01-24 MED FILL — Lisinopril Tab 10 MG: ORAL | 90 days supply | Qty: 90 | Fill #1 | Status: AC

## 2021-01-25 ENCOUNTER — Other Ambulatory Visit (HOSPITAL_COMMUNITY): Payer: Self-pay

## 2021-02-01 ENCOUNTER — Other Ambulatory Visit (HOSPITAL_COMMUNITY): Payer: Self-pay

## 2021-03-15 ENCOUNTER — Other Ambulatory Visit (HOSPITAL_COMMUNITY): Payer: Self-pay

## 2021-03-15 DIAGNOSIS — Z7985 Long-term (current) use of injectable non-insulin antidiabetic drugs: Secondary | ICD-10-CM | POA: Diagnosis not present

## 2021-03-15 DIAGNOSIS — E1165 Type 2 diabetes mellitus with hyperglycemia: Secondary | ICD-10-CM | POA: Diagnosis not present

## 2021-03-15 DIAGNOSIS — Z7984 Long term (current) use of oral hypoglycemic drugs: Secondary | ICD-10-CM | POA: Diagnosis not present

## 2021-03-15 MED ORDER — CYCLOBENZAPRINE HCL 10 MG PO TABS
ORAL_TABLET | ORAL | 0 refills | Status: DC
  Filled 2021-03-15: qty 40, 14d supply, fill #0

## 2021-03-24 ENCOUNTER — Encounter: Payer: Self-pay | Admitting: Primary Care

## 2021-04-26 ENCOUNTER — Other Ambulatory Visit (HOSPITAL_COMMUNITY): Payer: Self-pay

## 2021-04-26 MED FILL — Lisinopril Tab 10 MG: ORAL | 90 days supply | Qty: 90 | Fill #2 | Status: AC

## 2021-04-26 MED FILL — Semaglutide Soln Pen-inj 0.25 or 0.5 MG/DOSE (2 MG/1.5ML): SUBCUTANEOUS | 28 days supply | Qty: 1.5 | Fill #1 | Status: AC

## 2021-04-26 MED FILL — Atorvastatin Calcium Tab 40 MG (Base Equivalent): ORAL | 90 days supply | Qty: 90 | Fill #2 | Status: AC

## 2021-04-27 ENCOUNTER — Other Ambulatory Visit (HOSPITAL_COMMUNITY): Payer: Self-pay

## 2021-06-14 ENCOUNTER — Other Ambulatory Visit (HOSPITAL_COMMUNITY): Payer: Self-pay

## 2021-06-14 MED FILL — Metformin HCl Tab ER 24HR 500 MG: ORAL | 90 days supply | Qty: 360 | Fill #1 | Status: AC

## 2021-06-16 ENCOUNTER — Other Ambulatory Visit (HOSPITAL_COMMUNITY): Payer: Self-pay

## 2021-06-29 ENCOUNTER — Other Ambulatory Visit (HOSPITAL_COMMUNITY): Payer: Self-pay

## 2021-07-05 ENCOUNTER — Other Ambulatory Visit (HOSPITAL_COMMUNITY): Payer: Self-pay

## 2021-07-05 DIAGNOSIS — Z7984 Long term (current) use of oral hypoglycemic drugs: Secondary | ICD-10-CM | POA: Diagnosis not present

## 2021-07-05 DIAGNOSIS — Z8349 Family history of other endocrine, nutritional and metabolic diseases: Secondary | ICD-10-CM | POA: Diagnosis not present

## 2021-07-05 DIAGNOSIS — E1165 Type 2 diabetes mellitus with hyperglycemia: Secondary | ICD-10-CM | POA: Diagnosis not present

## 2021-07-05 DIAGNOSIS — R5383 Other fatigue: Secondary | ICD-10-CM | POA: Diagnosis not present

## 2021-07-05 DIAGNOSIS — Z7985 Long-term (current) use of injectable non-insulin antidiabetic drugs: Secondary | ICD-10-CM | POA: Diagnosis not present

## 2021-07-05 MED ORDER — FARXIGA 10 MG PO TABS
10.0000 mg | ORAL_TABLET | Freq: Every day | ORAL | 3 refills | Status: DC
  Filled 2021-07-05: qty 30, 30d supply, fill #0
  Filled 2021-08-02: qty 30, 30d supply, fill #1
  Filled 2021-09-08: qty 30, 30d supply, fill #2

## 2021-07-05 MED ORDER — OZEMPIC (0.25 OR 0.5 MG/DOSE) 2 MG/1.5ML ~~LOC~~ SOPN
PEN_INJECTOR | SUBCUTANEOUS | 3 refills | Status: DC
  Filled 2021-07-05: qty 4.5, 84d supply, fill #0
  Filled 2021-11-22: qty 4.5, 84d supply, fill #1

## 2021-08-02 ENCOUNTER — Other Ambulatory Visit: Payer: Self-pay | Admitting: Primary Care

## 2021-08-02 ENCOUNTER — Other Ambulatory Visit (HOSPITAL_COMMUNITY): Payer: Self-pay

## 2021-08-02 DIAGNOSIS — E1159 Type 2 diabetes mellitus with other circulatory complications: Secondary | ICD-10-CM

## 2021-08-02 DIAGNOSIS — E1169 Type 2 diabetes mellitus with other specified complication: Secondary | ICD-10-CM

## 2021-08-03 ENCOUNTER — Other Ambulatory Visit (HOSPITAL_COMMUNITY): Payer: Self-pay

## 2021-08-03 MED ORDER — ATORVASTATIN CALCIUM 40 MG PO TABS
40.0000 mg | ORAL_TABLET | Freq: Every day | ORAL | 0 refills | Status: DC
  Filled 2021-08-03: qty 30, 30d supply, fill #0

## 2021-08-03 MED ORDER — LISINOPRIL 10 MG PO TABS
10.0000 mg | ORAL_TABLET | Freq: Every day | ORAL | 0 refills | Status: DC
Start: 2021-08-03 — End: 2021-09-08
  Filled 2021-08-03: qty 30, 30d supply, fill #0

## 2021-09-08 ENCOUNTER — Ambulatory Visit (INDEPENDENT_AMBULATORY_CARE_PROVIDER_SITE_OTHER): Payer: 59 | Admitting: Primary Care

## 2021-09-08 ENCOUNTER — Other Ambulatory Visit (HOSPITAL_COMMUNITY): Payer: Self-pay

## 2021-09-08 ENCOUNTER — Encounter: Payer: Self-pay | Admitting: Primary Care

## 2021-09-08 VITALS — BP 116/80 | HR 91 | Ht 65.0 in | Wt 182.0 lb

## 2021-09-08 DIAGNOSIS — Z0000 Encounter for general adult medical examination without abnormal findings: Secondary | ICD-10-CM | POA: Diagnosis not present

## 2021-09-08 DIAGNOSIS — M542 Cervicalgia: Secondary | ICD-10-CM

## 2021-09-08 DIAGNOSIS — E1159 Type 2 diabetes mellitus with other circulatory complications: Secondary | ICD-10-CM | POA: Diagnosis not present

## 2021-09-08 DIAGNOSIS — N289 Disorder of kidney and ureter, unspecified: Secondary | ICD-10-CM | POA: Diagnosis not present

## 2021-09-08 DIAGNOSIS — Z1211 Encounter for screening for malignant neoplasm of colon: Secondary | ICD-10-CM | POA: Diagnosis not present

## 2021-09-08 DIAGNOSIS — Z1231 Encounter for screening mammogram for malignant neoplasm of breast: Secondary | ICD-10-CM

## 2021-09-08 DIAGNOSIS — E1169 Type 2 diabetes mellitus with other specified complication: Secondary | ICD-10-CM | POA: Diagnosis not present

## 2021-09-08 DIAGNOSIS — E118 Type 2 diabetes mellitus with unspecified complications: Secondary | ICD-10-CM

## 2021-09-08 DIAGNOSIS — R5382 Chronic fatigue, unspecified: Secondary | ICD-10-CM | POA: Diagnosis not present

## 2021-09-08 DIAGNOSIS — I152 Hypertension secondary to endocrine disorders: Secondary | ICD-10-CM | POA: Diagnosis not present

## 2021-09-08 DIAGNOSIS — E1165 Type 2 diabetes mellitus with hyperglycemia: Secondary | ICD-10-CM | POA: Diagnosis not present

## 2021-09-08 DIAGNOSIS — F411 Generalized anxiety disorder: Secondary | ICD-10-CM | POA: Insufficient documentation

## 2021-09-08 DIAGNOSIS — G8929 Other chronic pain: Secondary | ICD-10-CM

## 2021-09-08 DIAGNOSIS — F339 Major depressive disorder, recurrent, unspecified: Secondary | ICD-10-CM

## 2021-09-08 DIAGNOSIS — E782 Mixed hyperlipidemia: Secondary | ICD-10-CM | POA: Diagnosis not present

## 2021-09-08 LAB — LIPID PANEL
Cholesterol: 143 mg/dL (ref 0–200)
HDL: 37.6 mg/dL — ABNORMAL LOW (ref >39.00)
NonHDL: 105.45
Total CHOL/HDL Ratio: 4
Triglycerides: 262 mg/dL — ABNORMAL HIGH (ref 0.0–149.0)
VLDL: 52.4 mg/dL — ABNORMAL HIGH (ref 0.0–40.0)

## 2021-09-08 LAB — COMPREHENSIVE METABOLIC PANEL
ALT: 18 U/L (ref 0–35)
AST: 18 U/L (ref 0–37)
Albumin: 4.6 g/dL (ref 3.5–5.2)
Alkaline Phosphatase: 36 U/L — ABNORMAL LOW (ref 39–117)
BUN: 22 mg/dL (ref 6–23)
CO2: 24 mEq/L (ref 19–32)
Calcium: 9.5 mg/dL (ref 8.4–10.5)
Chloride: 101 mEq/L (ref 96–112)
Creatinine, Ser: 1.26 mg/dL — ABNORMAL HIGH (ref 0.40–1.20)
GFR: 50.46 mL/min — ABNORMAL LOW (ref >60.00)
Glucose, Bld: 122 mg/dL — ABNORMAL HIGH (ref 70–99)
Potassium: 4.2 mEq/L (ref 3.5–5.1)
Sodium: 134 mEq/L — ABNORMAL LOW (ref 135–145)
Total Bilirubin: 1.3 mg/dL — ABNORMAL HIGH (ref 0.2–1.2)
Total Protein: 7.9 g/dL (ref 6.0–8.3)

## 2021-09-08 LAB — LDL CHOLESTEROL, DIRECT: Direct LDL: 77 mg/dL

## 2021-09-08 LAB — CBC
HCT: 40.5 % (ref 36.0–46.0)
Hemoglobin: 13.6 g/dL (ref 12.0–15.0)
MCHC: 33.6 g/dL (ref 30.0–36.0)
MCV: 93.8 fl (ref 78.0–100.0)
Platelets: 205 10*3/uL (ref 150.0–400.0)
RBC: 4.32 Mil/uL (ref 3.87–5.11)
RDW: 13.3 % (ref 11.5–15.5)
WBC: 9.6 10*3/uL (ref 4.0–10.5)

## 2021-09-08 LAB — VITAMIN B12: Vitamin B-12: >1504 pg/mL — ABNORMAL HIGH (ref 211–911)

## 2021-09-08 MED ORDER — METFORMIN HCL ER 500 MG PO TB24: ORAL_TABLET | ORAL | 0 refills | Status: DC

## 2021-09-08 MED ORDER — ATORVASTATIN CALCIUM 40 MG PO TABS
40.0000 mg | ORAL_TABLET | Freq: Every day | ORAL | 3 refills | Status: DC
  Filled 2021-09-08: qty 90, 90d supply, fill #0
  Filled 2021-12-05: qty 90, 90d supply, fill #1
  Filled 2022-03-07: qty 90, 90d supply, fill #2
  Filled 2022-06-03: qty 90, 90d supply, fill #3

## 2021-09-08 MED ORDER — LISINOPRIL 10 MG PO TABS
10.0000 mg | ORAL_TABLET | Freq: Every day | ORAL | 3 refills | Status: DC
  Filled 2021-09-08: qty 90, 90d supply, fill #0
  Filled 2021-12-05: qty 90, 90d supply, fill #1
  Filled 2022-03-07: qty 90, 90d supply, fill #2
  Filled 2022-06-03: qty 90, 90d supply, fill #3

## 2021-09-08 NOTE — Assessment & Plan Note (Signed)
Following with endocrinology. ?A1C reviewed from February 2023. ? ?Continue Farxiga 10 mg daily, Ozempic 0.25 mg weekly, Metformin XR 1000 mg in AM and XR 500 mg in PM.  ? ?Follow up due in June 2023. ?

## 2021-09-08 NOTE — Progress Notes (Signed)
? ?Subjective:  ? ? Patient ID: Erin Marquez, female    DOB: November 04, 1970, 51 y.o.   MRN: [id number] ? ?HPI ? ?Erin Marquez is a very pleasant 51 y.o. female who presents today for complete physical and follow up of chronic conditions. ? ?She would also like to mention chronic fatigue. Chronic fatigue since her hysterectomy June 2022. She's been told by GYN that this was normal and she needed to fight through. She denies snoring. She does have chronic anxiety. She is taking vitamin B12 5000 mcg daily. TSH was normal from February 2023. ? ?Immunizations: ?-Tetanus: 2016 ?-Influenza: Completed last season  ?-Covid-19: 2 vaccines ? ?Diet: Fair diet.  ?Exercise: No regular exercise. ? ?Eye exam: Completes annually  ?Dental exam: Completes semi-annually  ? ?Pap Smear: Hysterectomy  ?Mammogram: Completed in June 2022 ?Colonoscopy: Never completed, declined last year. Agrees today. ? ?BP Readings from Last 3 Encounters:  ?09/08/21 116/80  ?12/22/20 (!) 158/88  ?09/02/20 113/77  ? ? ? ?Review of Systems  ?Constitutional:  Positive for fatigue. Negative for unexpected weight change.  ?HENT:  Negative for rhinorrhea.   ?Respiratory:  Negative for cough and shortness of breath.   ?Cardiovascular:  Negative for chest pain.  ?Gastrointestinal:  Negative for constipation and diarrhea.  ?Genitourinary:  Negative for difficulty urinating.  ?Musculoskeletal:  Positive for arthralgias and neck pain. Negative for myalgias.  ?Skin:  Negative for rash.  ?Allergic/Immunologic: Negative for environmental allergies.  ?Neurological:  Negative for dizziness and headaches.  ?Psychiatric/Behavioral:  The patient is nervous/anxious.   ? ?   ? ? ?Past Medical History:  ?Diagnosis Date  ? Anxiety   ? Arthritis   ? Depression   ? PMH  ? Diabetes mellitus without complication ([place])   ? type 2  ? Dog bite   ? left hand  ? History of kidney stones   ? Hyperlipidemia   ? Hypertension   ? Polycystic ovarian syndrome   ? Seasonal allergies   ? ? ?Social  History  ? ?Socioeconomic History  ? Marital status: Married  ?  Spouse name: Not on file  ? Number of children: Not on file  ? Years of education: Not on file  ? Highest education level: Not on file  ?Occupational History  ? Not on file  ?Tobacco Use  ? Smoking status: Never  ? Smokeless tobacco: Never  ?Vaping Use  ? Vaping Use: Never used  ?Substance and Sexual Activity  ? Alcohol use: Yes  ?  Alcohol/week: 1.0 standard drink  ?  Types: 1 Standard drinks or equivalent per week  ?  Comment: Monthly  ? Drug use: No  ? Sexual activity: Yes  ?  Birth control/protection: None, Surgical  ?  Comment: vasectomy  ?Other Topics Concern  ? Not on file  ?Social History Narrative  ? Not on file  ? ?Social Determinants of Health  ? ?Financial Resource Strain: Not on file  ?Food Insecurity: Not on file  ?Transportation Needs: Not on file  ?Physical Activity: Not on file  ?Stress: Not on file  ?Social Connections: Not on file  ?Intimate Partner Violence: Not on file  ? ? ?Past Surgical History:  ?Procedure Laterality Date  ? CERVICAL FUSION  07/2020  ? CYSTOSCOPY/URETEROSCOPY/HOLMIUM LASER/STENT PLACEMENT Left 03/22/2019  ? Procedure: CYSTOSCOPY/URETEROSCOPY/STENT PLACEMENT/ BASKET STONE EXTRACTION/ LEFT RETROGRADE PYELOGRAM;  Surgeon: Ceasar Mons, MD;  Location: [facility];  Service: Urology;  Laterality: Left;  ? HYMENECTOMY N/A 12/05/2018  ? Procedure:  Vaginoplasty;  Surgeon: [name], MD;  Location: [place];  Service: Gynecology;  Laterality: N/A;  Please have four 2-0 Vicryl on a CT-1 and 60ms of 0.25% Marcaine  ? I & D EXTREMITY Left 08/10/2019  ? Procedure: IRRIGATION AND DEBRIDEMENT LEFT SMALL FINGER AND LEFT HAND WOUNDS, PERCUTANEOUS PINNING LEFT SMALL FINGER;  Surgeon: CVerner Mould MD;  [place] [facility]  Service: Orthopedics;  Laterality: Left;  ? VAGINA SURGERY  2010  ? extra skin removed  ? WISDOM TOOTH EXTRACTION    ? ? ?Family History  ?Problem Relation  Age of Onset  ? Hypertension Mother   ? Stroke Mother   ? Diabetes Father   ? Hyperthyroidism Father   ? Prostate cancer Father   ? Anxiety disorder Daughter   ? ? ?Allergies  ?Allergen Reactions  ? Peanut Oil Anaphylaxis  ?  Slurred speech ?Slurred speech  ? Codeine Nausea Only and Nausea And Vomiting  ? ? ?Current Outpatient Medications on File Prior to Visit  ?Medication Sig Dispense Refill  ? acetaminophen (TYLENOL) 325 MG tablet Take 2 tablets by mouth every 6 hours 60 tablet 2  ? acetaminophen (TYLENOL) 650 MG CR tablet Take 1 tablet by mouth at bedtime. And as needed    ? blood glucose meter kit and supplies KIT Dispense based on patient and insurance preference. Use up to four times daily as directed. (FOR ICD-9 250.00, 250.01). 1 each 0  ? Blood Glucose Monitoring Suppl (FREESTYLE LITE) DEVI     ? Cholecalciferol (VITAMIN D3) 5000 units CAPS Take 1 capsule by mouth daily.    ? cyclobenzaprine (FLEXERIL) 10 MG tablet Take 1 tablet by mouth three times daily 40 tablet 0  ? dapagliflozin propanediol (FARXIGA) 10 MG TABS tablet Take 1 tablet by mouth daily. 30 tablet 3  ? fexofenadine (ALLEGRA) 180 MG tablet Take 180 mg by mouth daily.    ? glucose blood (FREESTYLE LITE) test strip Use as instructed to test blood sugar up to 4 times daily 300 each 2  ? ibuprofen (ADVIL) 600 MG tablet Take 1 tablet by mouth every 6 hours 60 tablet 2  ? Lancets (FREESTYLE) lancets     ? Omega-3 Fatty Acids (FISH OIL) 1200 MG CAPS 2 cap twice daily (Patient taking differently: 1 in am 2 in pm)    ? Semaglutide,0.25 or 0.5MG/DOS, (OZEMPIC, 0.25 OR 0.5 MG/DOSE,) 2 MG/1.5ML SOPN Inject 0.25 mg total into the skin once a week. 4.5 mL 3  ? vitamin B-12 (CYANOCOBALAMIN) 100 MCG tablet Take 1 capsule by mouth daily.    ? estradiol (ESTRACE) 0.1 MG/GM vaginal cream Place a pea-sized amount in the vagina nightly X 2 weeks, then use every other night (Patient not taking: Reported on 09/08/2021) 42 g 3  ? ?No current facility-administered  medications on file prior to visit.  ? ? ?BP 116/80   Pulse 91   Ht '5\' 5"'  (1.651 m)   Wt 182 lb (82.6 kg)   LMP 10/27/2020   SpO2 96%   BMI 30.29 kg/m?  ?Objective:  ? Physical Exam ?HENT:  ?   Right Ear: Tympanic membrane and ear canal normal.  ?   Left Ear: Tympanic membrane and ear canal normal.  ?   Nose: Nose normal.  ?Eyes:  ?   Conjunctiva/sclera: Conjunctivae normal.  ?   Pupils: Pupils are equal, round, and reactive to light.  ?Neck:  ?   Thyroid: No thyromegaly.  ?Cardiovascular:  ?  Rate and Rhythm: Normal rate and regular rhythm.  ?   Heart sounds: No murmur heard. ?Pulmonary:  ?   Effort: Pulmonary effort is normal.  ?   Breath sounds: Normal breath sounds. No rales.  ?Abdominal:  ?   General: Bowel sounds are normal.  ?   Palpations: Abdomen is soft.  ?   Tenderness: There is no abdominal tenderness.  ?Musculoskeletal:     ?   General: Normal range of motion.  ?   Cervical back: Neck supple.  ?Lymphadenopathy:  ?   Cervical: No cervical adenopathy.  ?Skin: ?   General: Skin is warm and dry.  ?   Findings: No rash.  ?Neurological:  ?   Mental Status: She is alert and oriented to person, place, and time.  ?   Cranial Nerves: No cranial nerve deficit.  ?   Deep Tendon Reflexes: Reflexes are normal and symmetric.  ?Psychiatric:     ?   Mood and Affect: Mood normal.  ? ? ? ? ? ?   ?Assessment & Plan:  ? ? ? ? ?This visit occurred during the SARS-CoV-2 public health emergency.  Safety protocols were in place, including screening questions prior to the visit, additional usage of staff PPE, and extensive cleaning of exam room while observing appropriate contact time as indicated for disinfecting solutions.  ?

## 2021-09-08 NOTE — Assessment & Plan Note (Signed)
Immunizations up-to-date. ?Mammogram due in June 2022, discussed this with patient today. ? ?Colonoscopy overdue, referral placed to GI. ? ?Discussed the importance of a healthy diet and regular exercise in order for weight loss, and to reduce the risk of further co-morbidity. ? ?Exam today stable ?Labs pending ?

## 2021-09-08 NOTE — Assessment & Plan Note (Signed)
Patient denies concerns today. ?Continue to monitor. ?

## 2021-09-08 NOTE — Patient Instructions (Addendum)
Stop by the lab prior to leaving today. I will notify you of your results once received.  ? ?Call the [facility] to schedule your mammogram.  ? ?You will be contacted regarding your referral to GI.  Please let us know if you have not been contacted within two weeks.  ? ?It was a pleasure to see you today! ? ?Preventive Care 77-49 Years Old, Female ?Preventive care refers to lifestyle choices and visits with your health care provider that can promote health and wellness. Preventive care visits are also called wellness exams. ?What can I expect for my preventive care visit? ?Counseling ?Your health care provider may ask you questions about your: ?Medical history, including: ?Past medical problems. ?Family medical history. ?Pregnancy history. ?Current health, including: ?Menstrual cycle. ?Method of birth control. ?Emotional well-being. ?Home life and relationship well-being. ?Sexual activity and sexual health. ?Lifestyle, including: ?Alcohol, nicotine or tobacco, and drug use. ?Access to firearms. ?Diet, exercise, and sleep habits. ?Work and work Statistician. ?Sunscreen use. ?Safety issues such as seatbelt and bike helmet use. ?Physical exam ?Your health care provider will check your: ?Height and weight. These may be used to calculate your BMI (body mass index). BMI is a measurement that tells if you are at a healthy weight. ?Waist circumference. This measures the distance around your waistline. This measurement also tells if you are at a healthy weight and may help predict your risk of certain diseases, such as type 2 diabetes and high blood pressure. ?Heart rate and blood pressure. ?Body temperature. ?Skin for abnormal spots. ?What immunizations do I need? ? ?Vaccines are usually given at various ages, according to a schedule. Your health care provider will recommend vaccines for you based on your age, medical history, and lifestyle or other factors, such as travel or where you work. ?What tests do I  need? ?Screening ?Your health care provider may recommend screening tests for certain conditions. This may include: ?Lipid and cholesterol levels. ?Diabetes screening. This is done by checking your blood sugar (glucose) after you have not eaten for a while (fasting). ?Pelvic exam and Pap test. ?Hepatitis B test. ?Hepatitis C test. ?HIV (human immunodeficiency virus) test. ?STI (sexually transmitted infection) testing, if you are at risk. ?Lung cancer screening. ?Colorectal cancer screening. ?Mammogram. Talk with your health care provider about when you should start having regular mammograms. This may depend on whether you have a family history of breast cancer. ?BRCA-related cancer screening. This may be done if you have a family history of breast, ovarian, tubal, or peritoneal cancers. ?Bone density scan. This is done to screen for osteoporosis. ?Talk with your health care provider about your test results, treatment options, and if necessary, the need for more tests. ?Follow these instructions at home: ?Eating and drinking ? ?Eat a diet that includes fresh fruits and vegetables, whole grains, lean protein, and low-fat dairy products. ?Take vitamin and mineral supplements as recommended by your health care provider. ?Do not drink alcohol if: ?Your health care provider tells you not to drink. ?You are pregnant, may be pregnant, or are planning to become pregnant. ?If you drink alcohol: ?Limit how much you have to 0-1 drink a day. ?Know how much alcohol is in your drink. In the [place] one drink equals one 12 oz bottle of beer (355 mL), one 5 oz glass of wine (148 mL), or one 1? oz glass of hard liquor (44 mL). ?Lifestyle ?Brush your teeth every morning and night with fluoride toothpaste. Floss one time each day. ?Exercise for  at least 30 minutes 5 or more days each week. ?Do not use any products that contain nicotine or tobacco. These products include cigarettes, chewing tobacco, and vaping devices, such as  e-cigarettes. If you need help quitting, ask your health care provider. ?Do not use drugs. ?If you are sexually active, practice safe sex. Use a condom or other form of protection to prevent STIs. ?If you do not wish to become pregnant, use a form of birth control. If you plan to become pregnant, see your health care provider for a prepregnancy visit. ?Take aspirin only as told by your health care provider. Make sure that you understand how much to take and what form to take. Work with your health care provider to find out whether it is safe and beneficial for you to take aspirin daily. ?Find healthy ways to manage stress, such as: ?Meditation, yoga, or listening to music. ?Journaling. ?Talking to a trusted person. ?Spending time with friends and family. ?Minimize exposure to UV radiation to reduce your risk of skin cancer. ?Safety ?Always wear your seat belt while driving or riding in a vehicle. ?Do not drive: ?If you have been drinking alcohol. Do not ride with someone who has been drinking. ?When you are tired or distracted. ?While texting. ?If you have been using any mind-altering substances or drugs. ?Wear a helmet and other protective equipment during sports activities. ?If you have firearms in your house, make sure you follow all gun safety procedures. ?Seek help if you have been physically or sexually abused. ?What's next? ?Visit your health care provider once a year for an annual wellness visit. ?Ask your health care provider how often you should have your eyes and teeth checked. ?Stay up to date on all vaccines. ?This information is not intended to replace advice given to you by your health care provider. Make sure you discuss any questions you have with your health care provider. ?Document Revised: 10/21/2020 Document Reviewed: 10/21/2020 ?Elsevier Patient Education ? [place]. ? ?

## 2021-09-08 NOTE — Assessment & Plan Note (Signed)
Suspect this is a combination between her hysterectomy and anxiety.  Discussed this with patient today. ? ?Lower suspicion for sleep apnea, but will keep on differentials list. ? ?Checking labs today to rule out metabolic cause. ?

## 2021-09-08 NOTE — Assessment & Plan Note (Signed)
Overall stable. ?Following with orthopedics.  ? ?Continue cyclobenzaprine 10 mg PRN.  ?

## 2021-09-08 NOTE — Assessment & Plan Note (Signed)
Controlled. ? ?Continue lisinopril 10 mg daily. Refills provided.  ?CMP pending.  ?

## 2021-09-08 NOTE — Assessment & Plan Note (Signed)
Continue atorvastatin 40 mg daily. Repeat lipid panel pending. 

## 2021-09-08 NOTE — Assessment & Plan Note (Signed)
Reviewed renal function from February 2023 from [facility].  ? ?Repeat renal function pending. ?

## 2021-09-08 NOTE — Assessment & Plan Note (Signed)
Chronic, active today. ? ?Patient currently declines any treatment as she is able to manage on her own.  We discussed her fatigue and how anxiety can contribute. ? ?She will update if symptoms become uncontrolled. ?

## 2021-09-12 ENCOUNTER — Other Ambulatory Visit: Payer: Self-pay | Admitting: Primary Care

## 2021-09-12 DIAGNOSIS — N289 Disorder of kidney and ureter, unspecified: Secondary | ICD-10-CM

## 2021-09-14 ENCOUNTER — Other Ambulatory Visit: Payer: Self-pay | Admitting: Internal Medicine

## 2021-09-14 DIAGNOSIS — Z1231 Encounter for screening mammogram for malignant neoplasm of breast: Secondary | ICD-10-CM

## 2021-09-15 ENCOUNTER — Encounter: Payer: Self-pay | Admitting: Emergency Medicine

## 2021-09-15 ENCOUNTER — Ambulatory Visit
Admission: EM | Admit: 2021-09-15 | Discharge: 2021-09-15 | Disposition: A | Discharge status: Home / Self Care | Payer: 59 | Attending: Emergency Medicine | Admitting: Emergency Medicine

## 2021-09-15 ENCOUNTER — Other Ambulatory Visit (HOSPITAL_COMMUNITY): Payer: Self-pay

## 2021-09-15 ENCOUNTER — Ambulatory Visit
Admission: RE | Admit: 2021-09-15 | Discharge: 2021-09-15 | Disposition: A | Discharge status: Home / Self Care | Payer: 59 | Source: Ambulatory Visit | Attending: Primary Care | Admitting: Primary Care

## 2021-09-15 ENCOUNTER — Other Ambulatory Visit: Payer: Self-pay

## 2021-09-15 DIAGNOSIS — N289 Disorder of kidney and ureter, unspecified: Secondary | ICD-10-CM

## 2021-09-15 DIAGNOSIS — L239 Allergic contact dermatitis, unspecified cause: Secondary | ICD-10-CM

## 2021-09-15 DIAGNOSIS — N189 Chronic kidney disease, unspecified: Secondary | ICD-10-CM | POA: Diagnosis not present

## 2021-09-15 LAB — POCT FASTING CBG KUC MANUAL ENTRY: POCT Glucose (KUC): 115 mg/dL — AB (ref 70–99)

## 2021-09-15 MED ORDER — PREDNISONE 20 MG PO TABS
40.0000 mg | ORAL_TABLET | Freq: Every day | ORAL | 0 refills | Status: AC
  Filled 2021-09-15: qty 6, 3d supply, fill #0

## 2021-09-15 MED ORDER — FAMOTIDINE 20 MG PO TABS
20.0000 mg | ORAL_TABLET | Freq: Two times a day (BID) | ORAL | 2 refills | Status: DC
  Filled 2021-09-15: qty 30, 15d supply, fill #0

## 2021-09-15 MED ORDER — TRIAMCINOLONE ACETONIDE 0.1 % EX CREA
1.0000 "application " | TOPICAL_CREAM | Freq: Two times a day (BID) | CUTANEOUS | 0 refills | Status: DC | PRN
  Filled 2021-09-15: qty 30, 15d supply, fill #0

## 2021-09-15 MED ORDER — CETIRIZINE HCL 10 MG PO TABS
10.0000 mg | ORAL_TABLET | Freq: Every day | ORAL | 2 refills | Status: DC
  Filled 2021-09-15: qty 30, 30d supply, fill #0

## 2021-09-15 NOTE — ED Provider Notes (Signed)
?[facility] ? ? ? ?CSN: [id number] ?Arrival date & time: 09/15/21  1403 ? ? ?  ? ?History   ?Chief Complaint ?Chief Complaint  ?Patient presents with  ? Rash  ? ? ?HPI ?Erin Marquez is a 51 y.o. female.  ?Patient presents with one week history of itching to her back. Patient states she originally thought she had a bug bite on the left side of her back. She has been scratching it and feels it has spread. Does have history of sensitive skin and her bug bites usually spread when scratched. She has tried calamine lotion, benadryl, and cortisone cream with no relief of itching. She denies any burning or tingling. No drainage. Has not changed soaps or detergents, no new medications. Denies fever, shortness of breath, abdominal pain, vomiting/diarrhea.  ? ?Past Medical History:  ?Diagnosis Date  ? Anxiety   ? Arthritis   ? Depression   ? PMH  ? Diabetes mellitus without complication ([place])   ? type 2  ? Dog bite   ? left hand  ? History of kidney stones   ? Hyperlipidemia   ? Hypertension   ? Polycystic ovarian syndrome   ? Seasonal allergies   ? ? ?Patient Active Problem List  ? Diagnosis Date Noted  ? GAD (generalized anxiety disorder) 09/08/2021  ? Chronic fatigue 09/08/2021  ? Uterine prolapse 07/16/2020  ? Chronic neck pain 07/14/2020  ? Decreased renal function 07/10/2019  ? Preventative health care 07/10/2019  ? Rash and nonspecific skin eruption 12/27/2018  ? Otalgia 11/15/2018  ? Abnormal TSH 07/04/2018  ? Seborrheic keratosis 09/06/2017  ? PCOS (polycystic ovarian syndrome) 06/13/2017  ? Type 2 diabetes mellitus with hyperglycemia ([place]) 06/13/2017  ? Hypertension associated with diabetes ([place]) 06/13/2017  ? Mixed diabetic hyperlipidemia associated with type 2 diabetes mellitus ([place]) 06/13/2017  ? Vitamin D deficiency 06/13/2017  ? Environmental and seasonal allergies 06/13/2017  ? Chronic Arthralgias- bilateral hands and feet 06/13/2017  ? Family history of rheumatoid arthritis 06/13/2017  ? SK (solar  keratosis)- two on face 06/13/2017  ? Elevated liver enzymes- ALT and AST since  06/13/2017  ? Depression, recurrent ([place]) 06/16/2014  ? Sciatica 05/13/2014  ? ? ?Past Surgical History:  ?Procedure Laterality Date  ? CERVICAL FUSION  07/2020  ? CYSTOSCOPY/URETEROSCOPY/HOLMIUM LASER/STENT PLACEMENT Left 03/22/2019  ? Procedure: CYSTOSCOPY/URETEROSCOPY/STENT PLACEMENT/ BASKET STONE EXTRACTION/ LEFT RETROGRADE PYELOGRAM;  Surgeon: Ceasar Mons, MD;  Location: [facility];  Service: Urology;  Laterality: Left;  ? HYMENECTOMY N/A 12/05/2018  ? Procedure: Vaginoplasty;  Surgeon: [name], MD;  Location: [facility];  Service: Gynecology;  Laterality: N/A;  Please have four 2-0 Vicryl on a CT-1 and 60ms of 0.25% Marcaine  ? I & D EXTREMITY Left 08/10/2019  ? Procedure: IRRIGATION AND DEBRIDEMENT LEFT SMALL FINGER AND LEFT HAND WOUNDS, PERCUTANEOUS PINNING LEFT SMALL FINGER;  Surgeon: CVerner Mould MD;  Location: [facility]  Service: Orthopedics;  Laterality: Left;  ? VAGINA SURGERY  2010  ? extra skin removed  ? WISDOM TOOTH EXTRACTION    ? ? ?OB History   ? ? Gravida  ?2  ? Para  ?2  ? Term  ?2  ? Preterm  ?   ? AB  ?   ? Living  ?2  ?  ? ? SAB  ?   ? IAB  ?   ? Ectopic  ?   ? Multiple  ?   ? Live Births  ?   ?   ?  ?  ? ? ? ?  Home Medications   ? ?Prior to Admission medications   ?Medication Sig Start Date End Date Taking? Authorizing Provider  ?cetirizine (ZYRTEC ALLERGY) 10 MG tablet Take 1 tablet (10 mg total) by mouth daily. 09/15/21  Yes Teon Hudnall, Wells Guiles, PA-C  ?famotidine (PEPCID) 20 MG tablet Take 1 tablet (20 mg total) by mouth 2 (two) times daily. 09/15/21  Yes Lola Czerwonka, Wells Guiles, PA-C  ?predniSONE (DELTASONE) 20 MG tablet Take 2 tablets (40 mg total) by mouth daily for 3 days. 09/15/21 09/18/21 Yes Janaa Acero, Wells Guiles, PA-C  ?triamcinolone cream (KENALOG) 0.1 % Apply 1 application topically 2 (two) times daily as needed. 09/15/21  Yes Nyelah Emmerich, Wells Guiles, PA-C  ?atorvastatin  (LIPITOR) 40 MG tablet Take 1 tablet (40 mg total) by mouth daily. 09/08/21 09/08/22  Pleas Koch, NP  ?blood glucose meter kit and supplies KIT Dispense based on patient and insurance preference. Use up to four times daily as directed. (FOR ICD-9 250.00, 250.01). 07/04/18   Pleas Koch, NP  ?Blood Glucose Monitoring Suppl (FREESTYLE LITE) DEVI  07/04/18   [provider]  ?Cholecalciferol (VITAMIN D3) 5000 units CAPS Take 1 capsule by mouth daily.    [provider]  ?dapagliflozin propanediol (FARXIGA) 10 MG TABS tablet Take 1 tablet by mouth daily. 07/05/21     ?glucose blood (FREESTYLE LITE) test strip Use as instructed to test blood sugar up to 4 times daily 07/25/18   Pleas Koch, NP  ?Lancets (FREESTYLE) lancets  07/04/18   [provider]  ?lisinopril (ZESTRIL) 10 MG tablet Take 1 tablet by mouth daily for blood pressure. 09/08/21 09/08/22  Pleas Koch, NP  ?metFORMIN (GLUCOPHAGE-XR) 500 MG 24 hr tablet Take 2 tablets by mouth every morning and 1 tablet every evening for diabetes. 09/08/21   Pleas Koch, NP  ?Omega-3 Fatty Acids (FISH OIL) 1200 MG CAPS 2 cap twice daily ?Patient taking differently: 1 in am 2 in pm 07/05/17   [name], DO  ?Semaglutide,0.25 or 0.5MG/DOS, (OZEMPIC, 0.25 OR 0.5 MG/DOSE,) 2 MG/1.5ML SOPN Inject 0.25 mg total into the skin once a week. 07/05/21     ?vitamin B-12 (CYANOCOBALAMIN) 100 MCG tablet Take 1 capsule by mouth daily.    [provider]  ? ? ?Family History ?Family History  ?Problem Relation Age of Onset  ? Hypertension Mother   ? Stroke Mother   ? Diabetes Father   ? Hyperthyroidism Father   ? Prostate cancer Father   ? Anxiety disorder Daughter   ? ? ?Social History ?Social History  ? ?Tobacco Use  ? Smoking status: Never  ? Smokeless tobacco: Never  ?Vaping Use  ? Vaping Use: Never used  ?Substance Use Topics  ? Alcohol use: Yes  ?  Alcohol/week: 1.0 standard drink  ?  Types: 1 Standard drinks or equivalent  per week  ?  Comment: Monthly  ? Drug use: No  ? ? ? ?Allergies   ?Peanut oil and Codeine ? ? ?Review of Systems ?Review of Systems  ?Skin:  Positive for rash.  ?As per HPI ? ?Physical Exam ?Triage Vital Signs ?ED Triage Vitals [09/15/21 1429]  ?Enc Vitals Group  ?   BP 114/74  ?   Pulse Rate 86  ?   Resp 18  ?   Temp 98.3 ?F (36.8 ?C)  ?   Temp Source Oral  ?   SpO2 97 %  ?   Weight   ?   Height   ?   Head Circumference   ?  Peak Flow   ?   Pain Score 2  ?   Pain Loc   ?   Pain Edu?   ?   Excl. in [place]?   ? ?No data found. ? ?Updated Vital Signs ?BP 114/74 (BP Location: Right Arm)   Pulse 86   Temp 98.3 ?F (36.8 ?C) (Oral)   Resp 18   LMP 10/27/2020   SpO2 97%  ? ? ?Physical Exam ?Vitals and nursing note reviewed.  ?Constitutional:   ?   General: She is not in acute distress. ?   Appearance: She is well-developed.  ?HENT:  ?   Mouth/Throat:  ?   Mouth: Mucous membranes are moist.  ?   Pharynx: Oropharynx is clear.  ?Eyes:  ?   Conjunctiva/sclera: Conjunctivae normal.  ?Cardiovascular:  ?   Rate and Rhythm: Normal rate and regular rhythm.  ?Pulmonary:  ?   Effort: Pulmonary effort is normal. No respiratory distress.  ?   Breath sounds: Normal breath sounds.  ?Abdominal:  ?   Palpations: Abdomen is soft.  ?   Tenderness: There is no abdominal tenderness.  ?Musculoskeletal:     ?   General: Normal range of motion.  ?Skin: ?   Findings: Rash present.  ?   Comments: Diffuse maculopapular rash to left upper back, see attached photo  ?Neurological:  ?   Mental Status: She is alert and oriented to person, place, and time.  ? ? ? ? ?UC Treatments / Results  ?Labs ?(all labs ordered are listed, but only abnormal results are displayed) ?Labs Reviewed  ?POCT FASTING [facility]G [facility] MANUAL ENTRY - Abnormal; Notable for the following components:  ?    Result Value  ? POCT Glucose ([facility]) 115 (*)   ? All other components within normal limits  ? ?EKG ? ?Radiology ?No results found. ? ?Procedures ?Procedures (including critical care  time) ? ?Medications Ordered in UC ?Medications - No data to display ? ?Initial Impression / Assessment and Plan / UC Course  ?I have reviewed the triage vital signs and the nursing notes. ? ?Pertinent labs & imagi

## 2021-09-15 NOTE — ED Triage Notes (Signed)
Pt here for rash to left side of back with burning and tingling x 3 days that she initially thought were bug bites; does not appear to cross midline  ?

## 2021-09-15 NOTE — Discharge Instructions (Signed)
Please take daily Zyrtec once a day along with Pepcid twice a day. ? ?Take steroid as prescribed. You will want to monitor your blood sugar while taking this medication as it can raise your glucose levels. Avoid complex carbohydrates and drink lots of water! ? ?You can apply the topical cream 2x daily as needed. ? ?Please return to the urgent care or emergency department if symptoms worsen or do not improve. ?

## 2021-09-23 NOTE — Telephone Encounter (Signed)
Erin Marquez, can we send her renal function testing (CMP) and recent renal ultrasound to her urologist at the fax number she provided?  I would like for them to see several of her CMP results.

## 2021-09-27 NOTE — Telephone Encounter (Signed)
Notes faxed as requested.

## 2021-10-11 ENCOUNTER — Other Ambulatory Visit (HOSPITAL_COMMUNITY): Payer: Self-pay

## 2021-10-11 DIAGNOSIS — E1165 Type 2 diabetes mellitus with hyperglycemia: Secondary | ICD-10-CM | POA: Diagnosis not present

## 2021-10-11 DIAGNOSIS — Z7985 Long-term (current) use of injectable non-insulin antidiabetic drugs: Secondary | ICD-10-CM | POA: Diagnosis not present

## 2021-10-11 DIAGNOSIS — Z7984 Long term (current) use of oral hypoglycemic drugs: Secondary | ICD-10-CM | POA: Diagnosis not present

## 2021-10-11 MED ORDER — PIOGLITAZONE HCL 30 MG PO TABS
30.0000 mg | ORAL_TABLET | Freq: Every day | ORAL | 3 refills | Status: DC
  Filled 2021-10-11: qty 90, 90d supply, fill #0
  Filled 2022-01-06: qty 90, 90d supply, fill #1
  Filled 2022-04-13: qty 90, 90d supply, fill #2
  Filled 2022-07-18: qty 90, 90d supply, fill #3

## 2021-10-11 MED ORDER — METFORMIN HCL ER 500 MG PO TB24
ORAL_TABLET | ORAL | 3 refills | Status: DC
  Filled 2021-10-11: qty 270, 90d supply, fill #0
  Filled 2022-01-17: qty 270, 90d supply, fill #1
  Filled 2022-04-25: qty 270, 90d supply, fill #2
  Filled 2022-07-25: qty 270, 90d supply, fill #3

## 2021-10-13 ENCOUNTER — Other Ambulatory Visit (HOSPITAL_COMMUNITY): Payer: Self-pay

## 2021-10-18 ENCOUNTER — Telehealth: Payer: Self-pay | Admitting: *Deleted

## 2021-10-18 DIAGNOSIS — H52223 Regular astigmatism, bilateral: Secondary | ICD-10-CM | POA: Diagnosis not present

## 2021-10-18 DIAGNOSIS — H5213 Myopia, bilateral: Secondary | ICD-10-CM | POA: Diagnosis not present

## 2021-10-18 DIAGNOSIS — H524 Presbyopia: Secondary | ICD-10-CM | POA: Diagnosis not present

## 2021-10-18 LAB — HM DIABETES EYE EXAM

## 2021-10-18 NOTE — Telephone Encounter (Signed)
John-please review anesthesia note from 2022 and advise if ok for [facility]?  Anesthesia Airway Date/Time: 10/30/2020 7:49 AM Urgency: elective  General Information and Staff Patient location during procedure: OR Anesthesiologist: Jearld Fenton, MDCRNA: Vista Deck, CRNA Performed by: CRNA   Indications and Patient Condition Indication for airway management: anesthesia Preoxygenated: yes Patient position: sniffingRapid sequence induction: NoNoManual in-line stabilization not maintained throughout. Mask difficulty assessment: 1 - easy ventilation by mask  Final Airway Details Final airway type: endotracheal airway Endotracheal tube type: ETT  Successful intubation technique: VL - Glidescope Facilitating devices/methods: intubating stylet  Endotracheal tube insertion site: oral Blade size: #3 ETT size: 6.5 mm Cuffed: yes View (Cormack Lehane grade): grade I - visualization of entire laryngeal aperture Placement verified by: chest auscultation/breath sounds equal bilaterally and capnometry/+EtCO2  Cuff volume (mL): 7 Measured from: lips ETT to lips (cm): 22  Number of attempts at approach: 1 Ventilation between attempts: none Number of other approaches attempted: 0 Airway placement trauma: none     Thanks,   Lattie Haw PV

## 2021-10-25 ENCOUNTER — Encounter: Payer: Self-pay | Admitting: Primary Care

## 2021-10-28 ENCOUNTER — Ambulatory Visit
Admission: RE | Admit: 2021-10-28 | Discharge: 2021-10-28 | Disposition: A | Discharge status: Home / Self Care | Payer: 59 | Source: Ambulatory Visit | Attending: Primary Care | Admitting: Primary Care

## 2021-10-28 DIAGNOSIS — Z1231 Encounter for screening mammogram for malignant neoplasm of breast: Secondary | ICD-10-CM

## 2021-10-29 ENCOUNTER — Other Ambulatory Visit (HOSPITAL_COMMUNITY): Payer: Self-pay

## 2021-10-29 ENCOUNTER — Ambulatory Visit (AMBULATORY_SURGERY_CENTER): Payer: Self-pay

## 2021-10-29 VITALS — Ht 65.0 in | Wt 184.0 lb

## 2021-10-29 DIAGNOSIS — Z1211 Encounter for screening for malignant neoplasm of colon: Secondary | ICD-10-CM

## 2021-10-29 MED ORDER — NA SULFATE-K SULFATE-MG SULF 17.5-3.13-1.6 GM/177ML PO SOLN
1.0000 | Freq: Once | ORAL | 0 refills | Status: AC
  Filled 2021-10-29: qty 354, 1d supply, fill #0

## 2021-11-04 ENCOUNTER — Other Ambulatory Visit (HOSPITAL_COMMUNITY): Payer: Self-pay

## 2021-11-22 ENCOUNTER — Encounter: Payer: Self-pay | Admitting: Internal Medicine

## 2021-11-23 ENCOUNTER — Other Ambulatory Visit (HOSPITAL_COMMUNITY): Payer: Self-pay

## 2021-11-23 MED ORDER — OZEMPIC (0.25 OR 0.5 MG/DOSE) 2 MG/3ML ~~LOC~~ SOPN
PEN_INJECTOR | SUBCUTANEOUS | 2 refills | Status: DC
  Filled 2021-11-23: qty 3, 56d supply, fill #0
  Filled 2022-01-31: qty 3, 56d supply, fill #1

## 2021-11-24 ENCOUNTER — Other Ambulatory Visit (HOSPITAL_COMMUNITY): Payer: Self-pay

## 2021-11-24 ENCOUNTER — Ambulatory Visit (AMBULATORY_SURGERY_CENTER): Payer: 59 | Admitting: Internal Medicine

## 2021-11-24 ENCOUNTER — Encounter: Payer: Self-pay | Admitting: Internal Medicine

## 2021-11-24 VITALS — BP 111/74 | HR 68 | Temp 97.5°F | Resp 8 | Ht 65.0 in | Wt 184.0 lb

## 2021-11-24 DIAGNOSIS — D120 Benign neoplasm of cecum: Secondary | ICD-10-CM

## 2021-11-24 DIAGNOSIS — F32A Depression, unspecified: Secondary | ICD-10-CM | POA: Diagnosis not present

## 2021-11-24 DIAGNOSIS — Z1211 Encounter for screening for malignant neoplasm of colon: Secondary | ICD-10-CM

## 2021-11-24 DIAGNOSIS — F419 Anxiety disorder, unspecified: Secondary | ICD-10-CM | POA: Diagnosis not present

## 2021-11-24 DIAGNOSIS — I10 Essential (primary) hypertension: Secondary | ICD-10-CM | POA: Diagnosis not present

## 2021-11-24 DIAGNOSIS — E785 Hyperlipidemia, unspecified: Secondary | ICD-10-CM | POA: Diagnosis not present

## 2021-11-24 DIAGNOSIS — E119 Type 2 diabetes mellitus without complications: Secondary | ICD-10-CM | POA: Diagnosis not present

## 2021-11-24 MED ORDER — SODIUM CHLORIDE 0.9 % IV SOLN: 500.0000 mL | Freq: Once | INTRAVENOUS | Status: DC

## 2021-11-24 NOTE — Progress Notes (Signed)
Pt's states no medical or surgical changes since previsit or office visit. 

## 2021-11-24 NOTE — Op Note (Signed)
[facility] Patient Name: Erin Marquez Procedure Date: 11/24/2021 12:30 PM MRN: [id number] Endoscopist: Sonny Masters "Erin Marquez ,  Age: 51 Referring MD:  Date of Birth: 08-28-1970 Gender: Female Account #: [id number] Procedure:                Colonoscopy Indications:              Screening for colorectal malignant neoplasm, This                            is the patient's first colonoscopy Medicines:                Monitored Anesthesia Care Procedure:                Pre-Anesthesia Assessment:                           - Prior to the procedure, a History and Physical                            was performed, and patient medications and                            allergies were reviewed. The patient's tolerance of                            previous anesthesia was also reviewed. The risks                            and benefits of the procedure and the sedation                            options and risks were discussed with the patient.                            All questions were answered, and informed consent                            was obtained. Prior Anticoagulants: The patient has                            taken no previous anticoagulant or antiplatelet                            agents. ASA Grade Assessment: II - A patient with                            mild systemic disease. After reviewing the risks                            and benefits, the patient was deemed in                            satisfactory condition to undergo the procedure.  After obtaining informed consent, the colonoscope                            was passed under direct vision. Throughout the                            procedure, the patient's blood pressure, pulse, and                            oxygen saturations were monitored continuously. The                            CF [id number] #[id number] was introduced through the anus                            and advanced to the the  terminal ileum. The                            colonoscopy was performed without difficulty. The                            patient tolerated the procedure well. The quality                            of the bowel preparation was good. The terminal                            ileum, ileocecal valve, appendiceal orifice, and                            rectum were photographed. Scope In: 12:33:22 PM Scope Out: 02:58:52 PM Scope Withdrawal Time: 0 hours 11 minutes 28 seconds  Total Procedure Duration: 0 hours 13 minutes 40 seconds  Findings:                 The terminal ileum appeared normal.                           Two sessile polyps were found in the cecum and                            ileocecal valve. The polyps were 3 to 4 mm in size.                            These polyps were removed with a cold snare.                            Resection and retrieval were complete.                           Multiple small and large-mouthed diverticula were                            found in the sigmoid colon and descending colon.  Non-bleeding internal hemorrhoids were found during                            retroflexion. Complications:            No immediate complications. Estimated Blood Loss:     Estimated blood loss was minimal. Impression:               - The examined portion of the ileum was normal.                           - Two 3 to 4 mm polyps in the cecum and at the                            ileocecal valve, removed with a cold snare.                            Resected and retrieved.                           - Diverticulosis in the sigmoid colon and in the                            descending colon.                           - Non-bleeding internal hemorrhoids. Recommendation:           - Discharge patient to home (with escort).                           - Await pathology results.                           - The findings and recommendations were  discussed                            with the patient. Sonny Masters "Erin Marquez,  11/24/2021 12:50:56 PM

## 2021-11-24 NOTE — Progress Notes (Signed)
Called to room to assist during endoscopic procedure.  Patient ID and intended procedure confirmed with present staff. Received instructions for my participation in the procedure from the performing physician.  

## 2021-11-24 NOTE — Progress Notes (Signed)
Report received for lunch relief, vss  William C. Wall CRNA

## 2021-11-24 NOTE — Progress Notes (Signed)
GASTROENTEROLOGY PROCEDURE H&P NOTE   Primary Care Physician: Pleas Koch, NP    Reason for Procedure:   Colon cancer screening  Plan:    Colonoscopy  Patient is appropriate for endoscopic procedure(s) in the ambulatory ([place]) setting.  The nature of the procedure, as well as the risks, benefits, and alternatives were carefully and thoroughly reviewed with the patient. Ample time for discussion and questions allowed. The patient understood, was satisfied, and agreed to proceed.     HPI: Erin Marquez is a 51 y.o. female who presents for colonoscopy for colon cancer screening. Denies blood in stools, changes in bowel habits, or weight loss. Denies family history of colon cancer.  Past Medical History:  Diagnosis Date   Anxiety    Arthritis    Depression    PMH   Diabetes mellitus without complication ([place])    type 2-on meds   Dog bite    left hand   History of kidney stones    Hyperlipidemia    Hypertension    Polycystic ovarian syndrome    Seasonal allergies     Past Surgical History:  Procedure Laterality Date   CERVICAL FUSION  07/2020   CYSTOSCOPY/URETEROSCOPY/HOLMIUM LASER/STENT PLACEMENT Left 03/22/2019   Procedure: CYSTOSCOPY/URETEROSCOPY/STENT PLACEMENT/ BASKET STONE EXTRACTION/ LEFT RETROGRADE PYELOGRAM;  Surgeon: Ceasar Mons, MD;  Location: [facility];  Service: Urology;  Laterality: Left;   HYMENECTOMY N/A 12/05/2018   Procedure: Vaginoplasty;  Surgeon: [name], MD;  Location: [place];  Service: Gynecology;  Laterality: N/A;  Please have four 2-0 Vicryl on a CT-1 and 39ms of 0.25% Marcaine   HYSTERECTOMY ABDOMINAL WITH SALPINGECTOMY Bilateral 11/2020   I & D EXTREMITY Left 08/10/2019   Procedure: IRRIGATION AND DEBRIDEMENT LEFT SMALL FINGER AND LEFT HAND WOUNDS, PERCUTANEOUS PINNING LEFT SMALL FINGER;  Surgeon: CVerner Mould MD;  [place]  Service: Orthopedics;  Laterality:  Left;   VAGINA SURGERY  2010   extra skin removed   WISDOM TOOTH EXTRACTION      Prior to Admission medications   Medication Sig Start Date End Date Taking? Authorizing Provider  acetaminophen (TYLENOL) 325 MG tablet Take 1 tablet by mouth every 6 (six) hours as needed. 10/30/20  Yes [provider]  atorvastatin (LIPITOR) 40 MG tablet Take 1 tablet (40 mg total) by mouth daily. 09/08/21 09/08/22 Yes CPleas Koch NP  Cholecalciferol (VITAMIN D3) 5000 units CAPS Take 1 capsule by mouth daily.   Yes [provider]  cyclobenzaprine (FLEXERIL) 10 MG tablet Take 1 tablet by mouth daily as needed. 03/15/21  Yes [provider]  lisinopril (ZESTRIL) 10 MG tablet Take 1 tablet by mouth daily for blood pressure. 09/08/21 09/08/22 Yes CPleas Koch NP  metFORMIN (GLUCOPHAGE-XR) 500 MG 24 hr tablet Take 2 tablets by mouth with breakfast and 1 tablet by mouth after dinner. 10/11/21  Yes   Multiple Vitamin (MULTIVITAMIN PO) Take 1 capsule by mouth daily.   Yes [provider]  Omega-3 Fatty Acids (FISH OIL) 1200 MG CAPS 2 cap twice daily Patient taking differently: Take 1 capsule by mouth daily at 6 (six) AM. 07/05/17  Yes Opalski, Deborah, DO  pioglitazone (ACTOS) 30 MG tablet Take 1 tablet by mouth daily. 10/11/21  Yes   Semaglutide,0.25 or 0.5MG/DOS, (OZEMPIC, 0.25 OR 0.5 MG/DOSE,) 2 MG/3ML SOPN Inject 0.263minto the skin once a week 07/05/21  Yes   blood glucose meter kit and supplies KIT  Dispense based on patient and insurance preference. Use up to four times daily as directed. (FOR ICD-9 250.00, 250.01). 07/04/18   Pleas Koch, NP  Blood Glucose Monitoring Suppl (FREESTYLE LITE) DEVI  07/04/18   [provider]  fexofenadine (ALLEGRA) 60 MG tablet Take 1 tablet by mouth daily.    [provider]  glucose blood (FREESTYLE LITE) test strip Use as instructed to test blood sugar up to 4 times daily 07/25/18   Pleas Koch, NP  ibuprofen  (ADVIL) 600 MG tablet Take 1 tablet by mouth every 6 (six) hours as needed. 10/30/20   [provider]  Lancets (FREESTYLE) lancets  07/04/18   [provider]  nystatin-triamcinolone ointment [facility]) daily as needed. 01/13/21   [provider]  OVER THE COUNTER MEDICATION daily as needed. 01/14/21   [provider]  Semaglutide,0.25 or 0.5MG/DOS, (OZEMPIC, 0.25 OR 0.5 MG/DOSE,) 2 MG/1.5ML SOPN Inject into the skin. 07/05/21   [provider]  triamcinolone cream (KENALOG) 0.1 % Apply 1 application topically 2 (two) times daily as needed. 09/15/21   Rising, Wells Guiles, PA-C    Current Outpatient Medications  Medication Sig Dispense Refill   acetaminophen (TYLENOL) 325 MG tablet Take 1 tablet by mouth every 6 (six) hours as needed.     atorvastatin (LIPITOR) 40 MG tablet Take 1 tablet (40 mg total) by mouth daily. 90 tablet 3   Cholecalciferol (VITAMIN D3) 5000 units CAPS Take 1 capsule by mouth daily.     cyclobenzaprine (FLEXERIL) 10 MG tablet Take 1 tablet by mouth daily as needed.     lisinopril (ZESTRIL) 10 MG tablet Take 1 tablet by mouth daily for blood pressure. 90 tablet 3   metFORMIN (GLUCOPHAGE-XR) 500 MG 24 hr tablet Take 2 tablets by mouth with breakfast and 1 tablet by mouth after dinner. 270 tablet 3   Multiple Vitamin (MULTIVITAMIN PO) Take 1 capsule by mouth daily.     Omega-3 Fatty Acids (FISH OIL) 1200 MG CAPS 2 cap twice daily (Patient taking differently: Take 1 capsule by mouth daily at 6 (six) AM.)     pioglitazone (ACTOS) 30 MG tablet Take 1 tablet by mouth daily. 90 tablet 3   Semaglutide,0.25 or 0.5MG/DOS, (OZEMPIC, 0.25 OR 0.5 MG/DOSE,) 2 MG/3ML SOPN Inject 0.69m into the skin once a week 9 mL 2   blood glucose meter kit and supplies KIT Dispense based on patient and insurance preference. Use up to four times daily as directed. (FOR ICD-9 250.00, 250.01). 1 each 0   Blood Glucose Monitoring Suppl (FREESTYLE LITE) DEVI       fexofenadine (ALLEGRA) 60 MG tablet Take 1 tablet by mouth daily.     glucose blood (FREESTYLE LITE) test strip Use as instructed to test blood sugar up to 4 times daily 300 each 2   ibuprofen (ADVIL) 600 MG tablet Take 1 tablet by mouth every 6 (six) hours as needed.     Lancets (FREESTYLE) lancets      nystatin-triamcinolone ointment (MYCOLOG) daily as needed.     OVER THE COUNTER MEDICATION daily as needed.     Semaglutide,0.25 or 0.5MG/DOS, (OZEMPIC, 0.25 OR 0.5 MG/DOSE,) 2 MG/1.5ML SOPN Inject into the skin.     triamcinolone cream (KENALOG) 0.1 % Apply 1 application topically 2 (two) times daily as needed. 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   Codeine Nausea Only and Nausea And Vomiting 05/23/2011    Family History  Problem Relation Age of Onset   Hypertension Mother    Stroke Mother    Diabetes Father    Hyperthyroidism Father    Prostate cancer Father    Anxiety disorder Daughter    Colon cancer Neg Hx    Colon polyps Neg Hx    Esophageal cancer Neg Hx    Stomach cancer Neg Hx    Rectal cancer Neg Hx     Social History   Socioeconomic History   Marital status: Married    Spouse name: Not on file   Number of children: Not on file   Years of education: Not on file   Highest education level: Not on file  Occupational History   Not on file  Tobacco Use   Smoking status: Never   Smokeless tobacco: Never  Vaping Use   Vaping Use: Never used  Substance and Sexual Activity   Alcohol use: Not Currently    Alcohol/week: 0.0 - 1.0 standard drinks of alcohol    Comment: Monthly   Drug use: No   Sexual activity: Yes    Birth control/protection: None, Surgical    Comment: vasectomy  Other  Topics Concern   Not on file  Social History Narrative   Not on file   Social Determinants of Health   Financial Resource Strain: Not on file  Food Insecurity: Not on file  Transportation Needs: Not on file  Physical Activity: Not on file  Stress: Not on file  Social Connections: Not on file  Intimate Partner Violence: Not on file    Physical Exam: Vital signs in last 24 hours: BP 99/65   Pulse 92   Temp (!) 97.5 F (36.4 C)   Ht '5\' 5"'  (1.651 m)   Wt 184 lb (83.5 kg)   LMP 10/27/2020   SpO2 100%   BMI 30.62 kg/m  GEN: NAD EYE: Sclerae anicteric ENT: MMM CV: Non-tachycardic Pulm: No increased work of breathing GI: Soft, NT/ND NEURO:  Alert & Oriented   Christia Reading, MD [facility]  11/24/2021 11:46 AM

## 2021-11-24 NOTE — Progress Notes (Signed)
Report given, vss

## 2021-11-24 NOTE — Patient Instructions (Signed)
   Handouts on polyps,diverticulosis,& hemorrhoids given to you today  Await pathology results on polyps removed    YOU HAD AN ENDOSCOPIC PROCEDURE TODAY AT [place]:   Refer to the procedure report that was given to you for any specific questions about what was found during the examination.  If the procedure report does not answer your questions, please call your gastroenterologist to clarify.  If you requested that your care partner not be given the details of your procedure findings, then the procedure report has been included in a sealed envelope for you to review at your convenience later.  YOU SHOULD EXPECT: Some feelings of bloating in the abdomen. Passage of more gas than usual.  Walking can help get rid of the air that was put into your GI tract during the procedure and reduce the bloating. If you had a lower endoscopy (such as a colonoscopy or flexible sigmoidoscopy) you may notice spotting of blood in your stool or on the toilet paper. If you underwent a bowel prep for your procedure, you may not have a normal bowel movement for a few days.  Please Note:  You might notice some irritation and congestion in your nose or some drainage.  This is from the oxygen used during your procedure.  There is no need for concern and it should clear up in a day or so.  SYMPTOMS TO REPORT IMMEDIATELY:  Following lower endoscopy (colonoscopy or flexible sigmoidoscopy):  Excessive amounts of blood in the stool  Significant tenderness or worsening of abdominal pains  Swelling of the abdomen that is new, acute  Fever of 100F or higher   For urgent or emergent issues, a gastroenterologist can be reached at any hour by calling [phone]. Do not use MyChart messaging for urgent concerns.    DIET:  We do recommend a small meal at first, but then you may proceed to your regular diet.  Drink plenty of fluids but you should avoid alcoholic beverages for 24 hours.  ACTIVITY:  You  should plan to take it easy for the rest of today and you should NOT DRIVE or use heavy machinery until tomorrow (because of the sedation medicines used during the test).    FOLLOW UP: Our staff will call the number listed on your records the next business day following your procedure.  We will call around 7:15- 8:00 am to check on you and address any questions or concerns that you may have regarding the information given to you following your procedure. If we do not reach you, we will leave a message.  If you develop any symptoms (ie: fever, flu-like symptoms, shortness of breath, cough etc.) before then, please call [phone].  If you test positive for Covid 19 in the 2 weeks post procedure, please call and report this information to [place].    If any biopsies were taken you will be contacted by phone or by letter within the next 1-3 weeks.  Please call us at [phone] if you have not heard about the biopsies in 3 weeks.    SIGNATURES/CONFIDENTIALITY: You and/or your care partner have signed paperwork which will be entered into your electronic medical record.  These signatures attest to the fact that that the information above on your After Visit Summary has been reviewed and is understood.  Full responsibility of the confidentiality of this discharge information lies with you and/or your care-partner.

## 2021-11-25 ENCOUNTER — Telehealth: Payer: Self-pay

## 2021-11-25 NOTE — Telephone Encounter (Signed)
Post procedure follow up call, no answer 

## 2021-11-26 ENCOUNTER — Encounter: Payer: Self-pay | Admitting: Internal Medicine

## 2021-12-06 ENCOUNTER — Other Ambulatory Visit (HOSPITAL_COMMUNITY): Payer: Self-pay

## 2021-12-16 ENCOUNTER — Other Ambulatory Visit (HOSPITAL_COMMUNITY): Payer: Self-pay

## 2021-12-16 DIAGNOSIS — Z09 Encounter for follow-up examination after completed treatment for conditions other than malignant neoplasm: Secondary | ICD-10-CM | POA: Diagnosis not present

## 2021-12-16 DIAGNOSIS — L309 Dermatitis, unspecified: Secondary | ICD-10-CM | POA: Diagnosis not present

## 2021-12-16 DIAGNOSIS — N951 Menopausal and female climacteric states: Secondary | ICD-10-CM | POA: Diagnosis not present

## 2021-12-17 ENCOUNTER — Other Ambulatory Visit (HOSPITAL_COMMUNITY): Payer: Self-pay

## 2021-12-17 MED ORDER — FLUCONAZOLE 150 MG PO TABS
150.0000 mg | ORAL_TABLET | Freq: Once | ORAL | 0 refills | Status: AC
  Filled 2021-12-17: qty 1, 1d supply, fill #0

## 2021-12-22 ENCOUNTER — Other Ambulatory Visit (HOSPITAL_COMMUNITY)
Admission: RE | Admit: 2021-12-22 | Discharge: 2021-12-22 | Disposition: A | Discharge status: Home / Self Care | Payer: 59 | Source: Ambulatory Visit | Attending: Family Medicine | Admitting: Family Medicine

## 2021-12-22 ENCOUNTER — Other Ambulatory Visit (HOSPITAL_COMMUNITY): Payer: Self-pay

## 2021-12-22 ENCOUNTER — Ambulatory Visit (INDEPENDENT_AMBULATORY_CARE_PROVIDER_SITE_OTHER): Payer: 59 | Admitting: Family Medicine

## 2021-12-22 ENCOUNTER — Encounter: Payer: Self-pay | Admitting: Family Medicine

## 2021-12-22 VITALS — BP 109/71 | HR 90 | Wt 185.0 lb

## 2021-12-22 DIAGNOSIS — L280 Lichen simplex chronicus: Secondary | ICD-10-CM | POA: Diagnosis not present

## 2021-12-22 DIAGNOSIS — R21 Rash and other nonspecific skin eruption: Secondary | ICD-10-CM | POA: Insufficient documentation

## 2021-12-22 DIAGNOSIS — N951 Menopausal and female climacteric states: Secondary | ICD-10-CM

## 2021-12-22 MED ORDER — NYSTATIN-TRIAMCINOLONE 100000-0.1 UNIT/GM-% EX OINT
TOPICAL_OINTMENT | Freq: Every day | CUTANEOUS | 2 refills | Status: DC | PRN
  Filled 2021-12-22: qty 60, 14d supply, fill #0

## 2021-12-22 NOTE — Progress Notes (Signed)
   Subjective:    Patient ID: Sharene Skeans is a 51 y.o. female presenting with Follow-up (Discuss hormones and rash )  on 12/22/2021  HPI: Reports difficulty with focusing and feeling fatigued. Thinks she might be perimenopausal. Also is s/p vag hyst with bilateral salpingectomy, bilateral uterosacral ligament suspension and posterior repair. Since August of last year she has a red rash on her vulva. Improves with Mycolog but returns. Has bad itching. Has had diflucan which has not eradicated it. She is T2DM.  Review of Systems  Constitutional:  Negative for chills and fever.  Respiratory:  Negative for shortness of breath.   Cardiovascular:  Negative for chest pain.  Gastrointestinal:  Negative for abdominal pain, nausea and vomiting.  Genitourinary:  Negative for dysuria.  Skin:  Negative for rash.      Objective:    BP 109/71   Pulse 90   Wt 185 lb (83.9 kg)   LMP 10/27/2020   BMI 30.79 kg/m  Physical Exam Constitutional:      General: She is not in acute distress.    Appearance: She is well-developed.  HENT:     Head: Normocephalic and atraumatic.  Eyes:     General: No scleral icterus. Cardiovascular:     Rate and Rhythm: Normal rate.  Pulmonary:     Effort: Pulmonary effort is normal.  Abdominal:     Palpations: Abdomen is soft.  Genitourinary:      Comments: Erythematous rash noted Musculoskeletal:     Cervical back: Neck supple.  Skin:    General: Skin is warm and dry.     Findings: Rash present. Rash is papular (erythematous).     Comments: In right axilla  Neurological:     Mental Status: She is alert and oriented to person, place, and time.    Procedure: Patient identified, informed consent signed, copy in chart, time out performed.    Area cleansed with Alcohol.  Injected with 1% Lidocaine with Epi.  3 mL. Area cleaned with Betadine and 4 mm punch biopsy performed without difficulty.  Hemostasis obtained with Silver Nitrate.  Patient  tolerated procedure well.        Assessment & Plan:   Problem List Items Addressed This Visit       Unprioritized   Rash and nonspecific skin eruption - Primary    In axilla, unclear etiology--continue clobetasol. In vulva, s/p biopsy, continue mycolog      Relevant Medications   nystatin-triamcinolone ointment (MYCOLOG)   Other Relevant Orders   Surgical pathology( [place])   Perimenopause    Will check FSH, if high then is definitive, but if low likely not--likely is in the perimenopause at minimum. Suggestions for anxiety management including yoga, mindfulness, meditation discussed. She does not want medications due to possible side effects.      Relevant Orders   Follicle stimulating hormone     Return if symptoms worsen or fail to improve.  [name], MD 12/22/2021 10:55 AM

## 2021-12-22 NOTE — Addendum Note (Signed)
Addended by: [name] on: 12/22/2021 02:42 PM   Modules accepted: Orders

## 2021-12-22 NOTE — Assessment & Plan Note (Signed)
In axilla, unclear etiology--continue clobetasol. In vulva, s/p biopsy, continue mycolog

## 2021-12-22 NOTE — Assessment & Plan Note (Signed)
Will check FSH, if high then is definitive, but if low likely not--likely is in the perimenopause at minimum. Suggestions for anxiety management including yoga, mindfulness, meditation discussed. She does not want medications due to possible side effects.

## 2021-12-23 ENCOUNTER — Other Ambulatory Visit (HOSPITAL_COMMUNITY): Payer: Self-pay

## 2021-12-23 LAB — FOLLICLE STIMULATING HORMONE: FSH: 6.4 m[IU]/mL

## 2021-12-24 LAB — SURGICAL PATHOLOGY

## 2021-12-31 ENCOUNTER — Other Ambulatory Visit (HOSPITAL_COMMUNITY): Payer: Self-pay

## 2022-01-06 ENCOUNTER — Other Ambulatory Visit (HOSPITAL_COMMUNITY): Payer: Self-pay

## 2022-01-11 ENCOUNTER — Other Ambulatory Visit (HOSPITAL_COMMUNITY): Payer: Self-pay

## 2022-01-12 DIAGNOSIS — G8929 Other chronic pain: Secondary | ICD-10-CM

## 2022-01-13 ENCOUNTER — Other Ambulatory Visit (HOSPITAL_COMMUNITY): Payer: Self-pay

## 2022-01-13 MED ORDER — CYCLOBENZAPRINE HCL 10 MG PO TABS
10.0000 mg | ORAL_TABLET | Freq: Every day | ORAL | 0 refills | Status: DC | PRN
  Filled 2022-01-13: qty 30, 30d supply, fill #0

## 2022-01-17 ENCOUNTER — Other Ambulatory Visit (HOSPITAL_COMMUNITY): Payer: Self-pay

## 2022-01-31 ENCOUNTER — Other Ambulatory Visit (HOSPITAL_COMMUNITY): Payer: Self-pay

## 2022-02-01 ENCOUNTER — Other Ambulatory Visit (HOSPITAL_COMMUNITY): Payer: Self-pay

## 2022-02-04 ENCOUNTER — Other Ambulatory Visit (HOSPITAL_COMMUNITY): Payer: Self-pay

## 2022-02-14 ENCOUNTER — Other Ambulatory Visit (HOSPITAL_COMMUNITY): Payer: Self-pay

## 2022-02-15 ENCOUNTER — Other Ambulatory Visit (HOSPITAL_COMMUNITY): Payer: Self-pay

## 2022-02-16 ENCOUNTER — Other Ambulatory Visit (HOSPITAL_COMMUNITY): Payer: Self-pay

## 2022-02-16 MED ORDER — OZEMPIC (0.25 OR 0.5 MG/DOSE) 2 MG/3ML ~~LOC~~ SOPN
0.5000 mg | PEN_INJECTOR | SUBCUTANEOUS | 1 refills | Status: DC
  Filled 2022-02-16 – 2022-10-18 (×2): qty 3, 28d supply, fill #0
  Filled 2022-11-13: qty 3, 28d supply, fill #1

## 2022-02-17 ENCOUNTER — Other Ambulatory Visit (HOSPITAL_COMMUNITY): Payer: Self-pay

## 2022-02-21 ENCOUNTER — Other Ambulatory Visit (HOSPITAL_COMMUNITY): Payer: Self-pay

## 2022-02-21 DIAGNOSIS — Z7984 Long term (current) use of oral hypoglycemic drugs: Secondary | ICD-10-CM | POA: Diagnosis not present

## 2022-02-21 DIAGNOSIS — Z7985 Long-term (current) use of injectable non-insulin antidiabetic drugs: Secondary | ICD-10-CM | POA: Diagnosis not present

## 2022-02-21 DIAGNOSIS — E1165 Type 2 diabetes mellitus with hyperglycemia: Secondary | ICD-10-CM | POA: Diagnosis not present

## 2022-02-21 MED ORDER — OZEMPIC (0.25 OR 0.5 MG/DOSE) 2 MG/3ML ~~LOC~~ SOPN
PEN_INJECTOR | SUBCUTANEOUS | 3 refills | Status: DC
  Filled 2022-02-21 (×2): qty 3, 28d supply, fill #0
  Filled 2022-04-04: qty 3, 28d supply, fill #1
  Filled 2022-04-19 – 2022-04-27 (×3): qty 3, 28d supply, fill #2
  Filled 2022-05-24: qty 3, 28d supply, fill #3
  Filled 2022-06-23: qty 3, 28d supply, fill #4
  Filled 2022-07-18 – 2022-07-19 (×2): qty 3, 28d supply, fill #5
  Filled 2022-08-23: qty 3, 28d supply, fill #6
  Filled 2022-09-15: qty 3, 28d supply, fill #7
  Filled ????-??-??: fill #0

## 2022-02-22 DIAGNOSIS — E1165 Type 2 diabetes mellitus with hyperglycemia: Secondary | ICD-10-CM | POA: Diagnosis not present

## 2022-02-24 ENCOUNTER — Other Ambulatory Visit (HOSPITAL_COMMUNITY): Payer: Self-pay

## 2022-02-25 ENCOUNTER — Other Ambulatory Visit (HOSPITAL_COMMUNITY): Payer: Self-pay

## 2022-02-25 ENCOUNTER — Ambulatory Visit
Admission: RE | Admit: 2022-02-25 | Discharge: 2022-02-25 | Disposition: A | Discharge status: Home / Self Care | Payer: 59 | Source: Ambulatory Visit | Attending: Family Medicine | Admitting: Family Medicine

## 2022-02-25 VITALS — BP 124/82 | HR 66 | Temp 97.8°F | Resp 17

## 2022-02-25 DIAGNOSIS — H6121 Impacted cerumen, right ear: Secondary | ICD-10-CM

## 2022-02-25 DIAGNOSIS — J3489 Other specified disorders of nose and nasal sinuses: Secondary | ICD-10-CM | POA: Diagnosis not present

## 2022-02-25 DIAGNOSIS — H6692 Otitis media, unspecified, left ear: Secondary | ICD-10-CM | POA: Diagnosis not present

## 2022-02-25 MED ORDER — AMOXICILLIN-POT CLAVULANATE 875-125 MG PO TABS
1.0000 | ORAL_TABLET | Freq: Two times a day (BID) | ORAL | 0 refills | Status: AC
  Filled 2022-02-25: qty 20, 10d supply, fill #0

## 2022-02-25 MED ORDER — PREDNISONE 20 MG PO TABS
60.0000 mg | ORAL_TABLET | Freq: Every day | ORAL | 0 refills | Status: DC
  Filled 2022-02-25: qty 15, 5d supply, fill #0

## 2022-02-25 NOTE — Discharge Instructions (Addendum)
Advised patient to take medication as directed with food to completion.  Advised patient to take prednisone with first dose of Augmentin for the next 5 of 10 days.  Encouraged patient increase daily water intake while taking these medications.  Advised patient if symptoms worsen and/or unresolved please follow-up with PCP or here for further evaluation.

## 2022-02-25 NOTE — ED Triage Notes (Signed)
Pt c/o LT ear pain and drainage x 1 week. Also states the LT side of face also feels puffy. Tylenol cold and sinus prn.

## 2022-02-25 NOTE — ED Provider Notes (Signed)
[facility] C[facility]    CSN: [id number] Arrival date & time: 02/25/22  1049      History   Chief Complaint Chief Complaint  Patient presents with   Ear Drainage    LT, 11AM appt   Otalgia    HPI Erin Marquez is a 51 y.o. female.   HPI 51 year old female presents with left ear pain and drainage for 1 week.  PMH significant for obesity, HTN, depression, and T2DM.  Past Medical History:  Diagnosis Date   Anxiety    Arthritis    Depression    PMH   Diabetes mellitus without complication ([place])    type 2-on meds   Dog bite    left hand   History of kidney stones    Hyperlipidemia    Hypertension    Polycystic ovarian syndrome    Seasonal allergies     Patient Active Problem List   Diagnosis Date Noted   Perimenopause 12/22/2021   GAD (generalized anxiety disorder) 09/08/2021   Chronic fatigue 09/08/2021   Chronic neck pain 07/14/2020   Decreased renal function 07/10/2019   Preventative health care 07/10/2019   Rash and nonspecific skin eruption 12/27/2018   Otalgia 11/15/2018   Abnormal TSH 07/04/2018   Seborrheic keratosis 09/06/2017   PCOS (polycystic ovarian syndrome) 06/13/2017   Type 2 diabetes mellitus with hyperglycemia ([place]) 06/13/2017   Hypertension associated with diabetes ([place]) 06/13/2017   Mixed diabetic hyperlipidemia associated with type 2 diabetes mellitus ([place]) 06/13/2017   Vitamin D deficiency 06/13/2017   Environmental and seasonal allergies 06/13/2017   Chronic Arthralgias- bilateral hands and feet 06/13/2017   Family history of rheumatoid arthritis 06/13/2017   SK (solar keratosis)- two on face 06/13/2017   Elevated liver enzymes- ALT and AST since  06/13/2017   Depression, recurrent ([place]) 06/16/2014   Sciatica 05/13/2014    Past Surgical History:  Procedure Laterality Date   CERVICAL FUSION  07/2020   CYSTOSCOPY/URETEROSCOPY/HOLMIUM LASER/STENT PLACEMENT Left 03/22/2019   Procedure: CYSTOSCOPY/URETEROSCOPY/STENT PLACEMENT/  BASKET STONE EXTRACTION/ LEFT RETROGRADE PYELOGRAM;  Surgeon: Ceasar Mons, MD;  Location: [facility];  Service: Urology;  Laterality: Left;   HYMENECTOMY N/A 12/05/2018   Procedure: Vaginoplasty;  Surgeon: [name], MD;  Location: [facility];  Service: Gynecology;  Laterality: N/A;  Please have four 2-0 Vicryl on a CT-1 and 41ms of 0.25% Marcaine   HYSTERECTOMY ABDOMINAL WITH SALPINGECTOMY Bilateral 11/2020   I & D EXTREMITY Left 08/10/2019   Procedure: IRRIGATION AND DEBRIDEMENT LEFT SMALL FINGER AND LEFT HAND WOUNDS, PERCUTANEOUS PINNING LEFT SMALL FINGER;  Surgeon: CVerner Mould MD;  Location: [facility]  Service: Orthopedics;  Laterality: Left;   VAGINA SURGERY  2010   extra skin removed   WISDOM TOOTH EXTRACTION      OB History     Gravida  2   Para  2   Term  2   Preterm      AB      Living  2      SAB      IAB      Ectopic      Multiple      Live Births               Home Medications    Prior to Admission medications   Medication Sig Start Date End Date Taking? Authorizing Provider  amoxicillin-clavulanate (AUGMENTIN) 875-125 MG tablet Take 1 tablet by mouth 2 (two) times daily for  10 days. 02/25/22 03/07/22 Yes Eliezer Lofts, FNP  predniSONE (DELTASONE) 20 MG tablet Take 3 tablets (60 mg total) by mouth daily for 5 days. 02/25/22  Yes Eliezer Lofts, FNP  atorvastatin (LIPITOR) 40 MG tablet Take 1 tablet (40 mg total) by mouth daily. 09/08/21 09/08/22  Pleas Koch, NP  blood glucose meter kit and supplies KIT Dispense based on patient and insurance preference. Use up to four times daily as directed. (FOR ICD-9 250.00, 250.01). 07/04/18   Pleas Koch, NP  Blood Glucose Monitoring Suppl (FREESTYLE LITE) DEVI  07/04/18   [provider]  Cholecalciferol (VITAMIN D3) 5000 units CAPS Take 1 capsule by mouth daily.    [provider]  cyclobenzaprine (FLEXERIL) 10 MG tablet Take  1 tablet (10 mg total) by mouth daily as needed. 01/13/22   Pleas Koch, NP  fexofenadine (ALLEGRA) 60 MG tablet Take 1 tablet by mouth daily.    [provider]  glucose blood (FREESTYLE LITE) test strip Use as instructed to test blood sugar up to 4 times daily 07/25/18   Pleas Koch, NP  ibuprofen (ADVIL) 600 MG tablet Take 1 tablet by mouth every 6 (six) hours as needed. 10/30/20   [provider]  Lancets (FREESTYLE) lancets  07/04/18   [provider]  lisinopril (ZESTRIL) 10 MG tablet Take 1 tablet by mouth daily for blood pressure. 09/08/21 09/08/22  Pleas Koch, NP  metFORMIN (GLUCOPHAGE-XR) 500 MG 24 hr tablet Take 2 tablets by mouth with breakfast and 1 tablet by mouth after dinner. 10/11/21     Multiple Vitamin (MULTIVITAMIN PO) Take 1 capsule by mouth daily.    [provider]  nystatin-triamcinolone ointment (MYCOLOG) Apply topically daily as needed. 12/22/21   [name], MD  Omega-3 Fatty Acids (FISH OIL) 1200 MG CAPS 2 cap twice daily Patient taking differently: Take 1 capsule by mouth daily at 6 (six) AM. 07/05/17   Opalski, Neoma Laming, DO  OVER THE COUNTER MEDICATION daily as needed. 01/14/21   [provider]  OZEMPIC, 0.25 OR 0.5 MG/DOSE, 2 MG/3ML SOPN Inject 0.5 mg into the skin once weekly as directed. 02/16/22     OZEMPIC, 0.25 OR 0.5 MG/DOSE, 2 MG/3ML SOPN Inject 0.5 mg into the skin once weekly as directed. 02/21/22     pioglitazone (ACTOS) 30 MG tablet Take 1 tablet by mouth daily. 10/11/21     Semaglutide,0.25 or 0.5MG/DOS, (OZEMPIC, 0.25 OR 0.5 MG/DOSE,) 2 MG/1.5ML SOPN Inject into the skin. 07/05/21   [provider]  triamcinolone cream (KENALOG) 0.1 % Apply 1 application topically 2 (two) times daily as needed. 09/15/21   Rising, Wells Guiles, PA-C    Family History Family History  Problem Relation Age of Onset   Hypertension Mother    Stroke Mother    Diabetes Father    Hyperthyroidism Father    Prostate  cancer Father    Anxiety disorder Daughter    Colon cancer Neg Hx    Colon polyps Neg Hx    Esophageal cancer Neg Hx    Stomach cancer Neg Hx    Rectal cancer Neg Hx     Social History Social History   Tobacco Use   Smoking status: Never   Smokeless tobacco: Never  Vaping Use   Vaping Use: Never used  Substance Use Topics   Alcohol use: Not Currently    Alcohol/week: 0.0 - 1.0 standard drinks of alcohol    Comment: Monthly   Drug use: No  Allergies   Peanut oil and Codeine   Review of Systems Review of Systems  HENT:  Positive for congestion and ear pain.   All other systems reviewed and are negative.    Physical Exam Triage Vital Signs ED Triage Vitals  Enc Vitals Group     BP 02/25/22 1110 124/82     Pulse Rate 02/25/22 1110 66     Resp 02/25/22 1110 17     Temp 02/25/22 1110 97.8 F (36.6 C)     Temp Source 02/25/22 1110 Oral     SpO2 02/25/22 1110 100 %     Weight --      Height --      Head Circumference --      Peak Flow --      Pain Score 02/25/22 1112 4     Pain Loc --      Pain Edu? --      Excl. in [place]? --    No data found.  Updated Vital Signs BP 124/82 (BP Location: Right Arm)   Pulse 66   Temp 97.8 F (36.6 C) (Oral)   Resp 17   LMP 10/27/2020   SpO2 100%    Physical Exam Vitals and nursing note reviewed.  Constitutional:      General: She is not in acute distress.    Appearance: Normal appearance. She is obese. She is not ill-appearing.  HENT:     Head: Normocephalic and atraumatic.     Right Ear: External ear normal.     Left Ear: External ear normal.     Ears:     Comments: Right EAC occluded with excessive cerumen unable to visualize right TM; left EAC is clear, left TM: Erythematous, retracted; post right EAC ear lavage: Right EAC-ear; right TM-clear, with good light reflex    Mouth/Throat:     Mouth: Mucous membranes are moist.     Pharynx: Oropharynx is clear.  Eyes:     Extraocular Movements: Extraocular  movements intact.     Conjunctiva/sclera: Conjunctivae normal.     Pupils: Pupils are equal, round, and reactive to light.  Cardiovascular:     Rate and Rhythm: Normal rate and regular rhythm.     Pulses: Normal pulses.     Heart sounds: Normal heart sounds. No murmur heard. Pulmonary:     Effort: Pulmonary effort is normal.     Breath sounds: Normal breath sounds. No wheezing, rhonchi or rales.  Musculoskeletal:        General: Normal range of motion.     Cervical back: Normal range of motion and neck supple. No tenderness.  Lymphadenopathy:     Cervical: No cervical adenopathy.  Skin:    General: Skin is warm.  Neurological:     General: No focal deficit present.     Mental Status: She is alert and oriented to person, place, and time. Mental status is at baseline.      UC Treatments / Results  Labs (all labs ordered are listed, but only abnormal results are displayed) Labs Reviewed - No data to display  EKG   Radiology No results found.  Procedures Procedures (including critical care time)  Medications Ordered in UC Medications - No data to display  Initial Impression / Assessment and Plan / UC Course  I have reviewed the triage vital signs and the nursing notes.  Pertinent labs & imaging results that were available during my care of the patient were reviewed by me and considered  in my medical decision making (see chart for details).     MDM: 1.  Acute left otitis media-Rx'd Augmentin; 2.  Sinus pressure-Rx'd prednisone; 3.  Impacted cerumen of right ear-resolved with ear lavage. Advised patient to take medication as directed with food to completion.  Advised patient to take prednisone with first dose of Augmentin for the next 5 of 10 days.  Encouraged patient increase daily water intake while taking these medications.  Advised patient if symptoms worsen and/or unresolved please follow-up with PCP or here for further evaluation.  Patient discharged home,  hemodynamically stable.  Final diagnoses:  Acute left otitis media  Impacted cerumen of right ear  Sinus pressure     Discharge Instructions      Advised patient to take medication as directed with food to completion.  Advised patient to take prednisone with first dose of Augmentin for the next 5 of 10 days.  Encouraged patient increase daily water intake while taking these medications.  Advised patient if symptoms worsen and/or unresolved please follow-up with PCP or here for further evaluation.     ED Prescriptions     Medication Sig Dispense Auth. Provider   amoxicillin-clavulanate (AUGMENTIN) 875-125 MG tablet Take 1 tablet by mouth 2 (two) times daily for 10 days. 20 tablet Eliezer Lofts, FNP   predniSONE (DELTASONE) 20 MG tablet Take 3 tablets (60 mg total) by mouth daily for 5 days. 15 tablet Eliezer Lofts, FNP      PDMP not reviewed this encounter.   Eliezer Lofts, [place] 02/25/22 1208

## 2022-02-28 ENCOUNTER — Encounter: Payer: Self-pay | Admitting: *Deleted

## 2022-03-07 ENCOUNTER — Other Ambulatory Visit (HOSPITAL_COMMUNITY): Payer: Self-pay

## 2022-04-04 ENCOUNTER — Other Ambulatory Visit (HOSPITAL_COMMUNITY): Payer: Self-pay

## 2022-04-13 ENCOUNTER — Other Ambulatory Visit (HOSPITAL_COMMUNITY): Payer: Self-pay

## 2022-04-19 ENCOUNTER — Other Ambulatory Visit (HOSPITAL_COMMUNITY): Payer: Self-pay

## 2022-04-25 ENCOUNTER — Other Ambulatory Visit (HOSPITAL_COMMUNITY): Payer: Self-pay

## 2022-04-27 ENCOUNTER — Other Ambulatory Visit: Payer: Self-pay

## 2022-05-24 ENCOUNTER — Other Ambulatory Visit (HOSPITAL_COMMUNITY): Payer: Self-pay

## 2022-06-03 ENCOUNTER — Other Ambulatory Visit (HOSPITAL_COMMUNITY): Payer: Self-pay

## 2022-06-08 ENCOUNTER — Ambulatory Visit
Admission: RE | Admit: 2022-06-08 | Discharge: 2022-06-08 | Disposition: A | Discharge status: Home / Self Care | Payer: 59 | Source: Ambulatory Visit | Attending: Urgent Care | Admitting: Urgent Care

## 2022-06-08 ENCOUNTER — Other Ambulatory Visit (HOSPITAL_COMMUNITY): Payer: Self-pay

## 2022-06-08 VITALS — BP 128/83 | HR 86 | Temp 98.9°F | Resp 18 | Ht 65.0 in | Wt 182.0 lb

## 2022-06-08 DIAGNOSIS — M26609 Unspecified temporomandibular joint disorder, unspecified side: Secondary | ICD-10-CM

## 2022-06-08 DIAGNOSIS — M791 Myalgia, unspecified site: Secondary | ICD-10-CM | POA: Diagnosis not present

## 2022-06-08 DIAGNOSIS — H6592 Unspecified nonsuppurative otitis media, left ear: Secondary | ICD-10-CM | POA: Diagnosis not present

## 2022-06-08 MED ORDER — FLUTICASONE PROPIONATE 50 MCG/ACT NA SUSP
1.0000 | Freq: Every day | NASAL | 0 refills | Status: DC
  Filled 2022-06-08: qty 16, 30d supply, fill #0

## 2022-06-08 MED ORDER — BACLOFEN 10 MG PO TABS
10.0000 mg | ORAL_TABLET | Freq: Three times a day (TID) | ORAL | 0 refills | Status: DC
  Filled 2022-06-08: qty 30, 10d supply, fill #0

## 2022-06-08 MED ORDER — PREDNISONE 10 MG (21) PO TBPK
ORAL_TABLET | Freq: Every day | ORAL | 0 refills | Status: DC
  Filled 2022-06-08: qty 21, 6d supply, fill #0

## 2022-06-08 NOTE — ED Provider Notes (Signed)
[name]ARE    CSN: [id number] Arrival date & time: 06/08/22  0954      History   Chief Complaint Chief Complaint  Patient presents with   Otalgia    HPI Sharene Skeans is a 52 y.o. female.   Pleasant 52 year old female presents today due to concerns of left ear pain.  States she had a similar thing in October 2023.  She attributes this to weather changes and stress.  States there is some discomfort with touching her ear, but denies any fluid.  No recent swimming.  She denies any fever, nasal congestion, rhinorrhea.  No sore throat no coughing.  She took Tylenol with no improvement.  She tried a Tylenol cold and sinus which helped to some degree.  She does feel like the ear is pressured.  Patient is uncertain whether she clenches or grinds her teeth at night.  She does report left-sided neck pain, tender to touch.  Denies lymphadenopathy.   Otalgia   Past Medical History:  Diagnosis Date   Anxiety    Arthritis    Depression    PMH   Diabetes mellitus without complication ([place])    type 2-on meds   Dog bite    left hand   History of kidney stones    Hyperlipidemia    Hypertension    Polycystic ovarian syndrome    Seasonal allergies     Patient Active Problem List   Diagnosis Date Noted   Perimenopause 12/22/2021   GAD (generalized anxiety disorder) 09/08/2021   Chronic fatigue 09/08/2021   Chronic neck pain 07/14/2020   Decreased renal function 07/10/2019   Preventative health care 07/10/2019   Rash and nonspecific skin eruption 12/27/2018   Otalgia 11/15/2018   Abnormal TSH 07/04/2018   Seborrheic keratosis 09/06/2017   PCOS (polycystic ovarian syndrome) 06/13/2017   Type 2 diabetes mellitus with hyperglycemia ([place]) 06/13/2017   Hypertension associated with diabetes ([place]) 06/13/2017   Mixed diabetic hyperlipidemia associated with type 2 diabetes mellitus ([place]) 06/13/2017   Vitamin D deficiency 06/13/2017   Environmental and seasonal allergies  06/13/2017   Chronic Arthralgias- bilateral hands and feet 06/13/2017   Family history of rheumatoid arthritis 06/13/2017   SK (solar keratosis)- two on face 06/13/2017   Elevated liver enzymes- ALT and AST since  06/13/2017   Depression, recurrent ([place]) 06/16/2014   Sciatica 05/13/2014    Past Surgical History:  Procedure Laterality Date   CERVICAL FUSION  07/2020   CYSTOSCOPY/URETEROSCOPY/HOLMIUM LASER/STENT PLACEMENT Left 03/22/2019   Procedure: CYSTOSCOPY/URETEROSCOPY/STENT PLACEMENT/ BASKET STONE EXTRACTION/ LEFT RETROGRADE PYELOGRAM;  Surgeon: Ceasar Mons, MD;  Location: [facility];  Service: Urology;  Laterality: Left;   HYMENECTOMY N/A 12/05/2018   Procedure: Vaginoplasty;  Surgeon: [name], MD;  Location: [place];  Service: Gynecology;  Laterality: N/A;  Please have four 2-0 Vicryl on a CT-1 and 82ms of 0.25% Marcaine   HYSTERECTOMY ABDOMINAL WITH SALPINGECTOMY Bilateral 11/2020   I & D EXTREMITY Left 08/10/2019   Procedure: IRRIGATION AND DEBRIDEMENT LEFT SMALL FINGER AND LEFT HAND WOUNDS, PERCUTANEOUS PINNING LEFT SMALL FINGER;  Surgeon: CVerner Mould MD;  Location[facility]  Service: Orthopedics;  Laterality: Left;   VAGINA SURGERY  2010   extra skin removed   WISDOM TOOTH EXTRACTION      OB History     Gravida  2   Para  2   Term  2   Preterm  AB      Living  2      SAB      IAB      Ectopic      Multiple      Live Births               Home Medications    Prior to Admission medications   Medication Sig Start Date End Date Taking? Authorizing Provider  atorvastatin (LIPITOR) 40 MG tablet Take 1 tablet (40 mg total) by mouth daily. 09/08/21 09/08/22 Yes Pleas Koch, NP  baclofen (LIORESAL) 10 MG tablet Take 1 tablet (10 mg total) by mouth 3 (three) times daily. 06/08/22  Yes Alzena Gerber L, PA  blood glucose meter kit and supplies KIT Dispense based on patient and insurance  preference. Use up to four times daily as directed. (FOR ICD-9 250.00, 250.01). 07/04/18  Yes Pleas Koch, NP  Blood Glucose Monitoring Suppl (FREESTYLE LITE) DEVI  07/04/18  Yes [provider]  Cholecalciferol (VITAMIN D3) 5000 units CAPS Take 1 capsule by mouth daily.   Yes [provider]  fexofenadine (ALLEGRA) 60 MG tablet Take 1 tablet by mouth daily.   Yes [provider]  fluticasone (FLONASE) 50 MCG/ACT nasal spray Place 1 spray into both nostrils daily. 06/08/22  Yes Leeta Grimme L, PA  glucose blood (FREESTYLE LITE) test strip Use as instructed to test blood sugar up to 4 times daily 07/25/18  Yes Pleas Koch, NP  Lancets (FREESTYLE) lancets  07/04/18  Yes [provider]  lisinopril (ZESTRIL) 10 MG tablet Take 1 tablet by mouth daily for blood pressure. 09/08/21 09/08/22 Yes Pleas Koch, NP  metFORMIN (GLUCOPHAGE-XR) 500 MG 24 hr tablet Take 2 tablets by mouth with breakfast and 1 tablet by mouth after dinner. 10/11/21  Yes   Multiple Vitamin (MULTIVITAMIN PO) Take 1 capsule by mouth daily.   Yes [provider]  nystatin-triamcinolone ointment (MYCOLOG) Apply topically daily as needed. 12/22/21  Yes [name], MD  Omega-3 Fatty Acids (FISH OIL) 1200 MG CAPS 2 cap twice daily Patient taking differently: Take 1 capsule by mouth daily at 6 (six) AM. 07/05/17  Yes Opalski, Neoma Laming, DO  OVER THE COUNTER MEDICATION daily as needed. 01/14/21  Yes [provider]  OZEMPIC, 0.25 OR 0.5 MG/DOSE, 2 MG/3ML SOPN Inject 0.5 mg into the skin once weekly as directed. 02/16/22  Yes   OZEMPIC, 0.25 OR 0.5 MG/DOSE, 2 MG/3ML SOPN Inject 0.5 mg into the skin once weekly as directed. 02/21/22  Yes   pioglitazone (ACTOS) 30 MG tablet Take 1 tablet by mouth daily. 10/11/21  Yes   predniSONE (STERAPRED UNI-PAK 21 TAB) 10 MG (21) TBPK tablet Take by mouth daily. Take 6 tablets by mouth daily  for 1 day, then 5 tabs for 1 day, then 4 tabs for 1  day, then 3 tabs for 1 day, 2 tabs for 1 day, then 1 tab by mouth daily for 1 day 06/08/22  Yes Deone Omahoney L, PA  triamcinolone cream (KENALOG) 0.1 % Apply 1 application topically 2 (two) times daily as needed. 09/15/21  Yes Rising, Wells Guiles, PA-C  ibuprofen (ADVIL) 600 MG tablet Take 1 tablet by mouth every 6 (six) hours as needed. 10/30/20   [provider]  Semaglutide,0.25 or 0.'5MG'$ /DOS, (OZEMPIC, 0.25 OR 0.5 MG/DOSE,) 2 MG/1.5ML SOPN Inject into the skin. 07/05/21   [provider]    Family History Family History  Problem Relation Age of Onset  Hypertension Mother    Stroke Mother    Diabetes Father    Hyperthyroidism Father    Prostate cancer Father    Anxiety disorder Daughter    Colon cancer Neg Hx    Colon polyps Neg Hx    Esophageal cancer Neg Hx    Stomach cancer Neg Hx    Rectal cancer Neg Hx     Social History Social History   Tobacco Use   Smoking status: Never   Smokeless tobacco: Never  Vaping Use   Vaping Use: Never used  Substance Use Topics   Alcohol use: Not Currently    Alcohol/week: 0.0 - 1.0 standard drinks of alcohol    Comment: Monthly   Drug use: No     Allergies   Peanut oil and Codeine   Review of Systems Review of Systems  HENT:  Positive for ear pain.   As per HPI   Physical Exam Triage Vital Signs ED Triage Vitals  Enc Vitals Group     BP 06/08/22 0959 128/83     Pulse Rate 06/08/22 0959 86     Resp 06/08/22 0959 18     Temp 06/08/22 0959 98.9 F (37.2 C)     Temp Source 06/08/22 0959 Oral     SpO2 06/08/22 0959 98 %     Weight 06/08/22 1001 182 lb (82.6 kg)     Height 06/08/22 1001 '5\' 5"'$  (1.651 m)     Head Circumference --      Peak Flow --      Pain Score 06/08/22 1001 4     Pain Loc --      Pain Edu? --      Excl. in [place]? --    No data found.  Updated Vital Signs BP 128/83 (BP Location: Right Arm)   Pulse 86   Temp 98.9 F (37.2 C) (Oral)   Resp 18   Ht '5\' 5"'$  (1.651 m)   Wt 182 lb (82.6 kg)    LMP 10/27/2020   SpO2 98%   BMI 30.29 kg/m   Visual Acuity Right Eye Distance:   Left Eye Distance:   Bilateral Distance:    Right Eye Near:   Left Eye Near:    Bilateral Near:     Physical Exam Vitals and nursing note reviewed.  Constitutional:      General: She is not in acute distress.    Appearance: Normal appearance. She is well-developed and normal weight. She is not ill-appearing, toxic-appearing or diaphoretic.  HENT:     Head: Normocephalic and atraumatic.     Jaw: Malocclusion present. No trismus.     Comments: B subluxation TMJ    Right Ear: Hearing, tympanic membrane, ear canal and external ear normal. No decreased hearing noted. No laceration. No middle ear effusion. No mastoid tenderness. No PE tube. Tympanic membrane is not injected, perforated, erythematous, retracted or bulging.     Left Ear: Ear canal and external ear normal. No drainage or swelling. A middle ear effusion is present. Tympanic membrane is not injected, perforated, erythematous, retracted or bulging.     Nose: Nose normal. No congestion or rhinorrhea.     Right Nostril: No foreign body.     Left Nostril: No foreign body.     Right Turbinates: Not enlarged or swollen.     Left Turbinates: Not enlarged or swollen.     Right Sinus: No maxillary sinus tenderness or frontal sinus tenderness.     Left Sinus:  No maxillary sinus tenderness or frontal sinus tenderness.     Mouth/Throat:     Lips: Pink.     Mouth: Mucous membranes are moist. No oral lesions.     Tongue: No lesions.     Palate: No mass.     Pharynx: Oropharynx is clear. Uvula midline. No pharyngeal swelling, oropharyngeal exudate, posterior oropharyngeal erythema or uvula swelling.  Eyes:     Conjunctiva/sclera: Conjunctivae normal.  Neck:     Trachea: Trachea normal.     Comments: Left SCM and scalenes very tense/ tender without appreciable knots Cardiovascular:     Rate and Rhythm: Normal rate and regular rhythm.     Heart  sounds: No murmur heard. Pulmonary:     Effort: Pulmonary effort is normal. No respiratory distress.     Breath sounds: Normal breath sounds.  Musculoskeletal:        General: No swelling.     Cervical back: Neck supple. No rigidity, torticollis or crepitus. Muscular tenderness present. Normal range of motion.  Lymphadenopathy:     Cervical: No cervical adenopathy.     Right cervical: No superficial, deep or posterior cervical adenopathy.    Left cervical: No superficial, deep or posterior cervical adenopathy.  Skin:    General: Skin is warm and dry.     Capillary Refill: Capillary refill takes less than 2 seconds.  Neurological:     General: No focal deficit present.     Mental Status: She is alert and oriented to person, place, and time.  Psychiatric:        Mood and Affect: Mood normal.      UC Treatments / Results  Labs (all labs ordered are listed, but only abnormal results are displayed) Labs Reviewed - No data to display  EKG   Radiology No results found.  Procedures Procedures (including critical care time)  Medications Ordered in UC Medications - No data to display  Initial Impression / Assessment and Plan / UC Course  I have reviewed the triage vital signs and the nursing notes.  Pertinent labs & imaging results that were available during my care of the patient were reviewed by me and considered in my medical decision making (see chart for details).     OM with effusion on L -some symptoms likely related to the effusion.  Suspect this is why she improved with the Sudafed.  Will have patient instead take Flonase and prednisone to help with eustachian tube dysfunction and removal of this clear fluid.  There is no apparent bacterial infection therefore antibiotics are not warranted. TMJ -significant subluxation noted bilaterally.  Question if some of her left-sided ear pain is secondary to TMJ.  She indicates this is worse with stress, suspect jaw clenching or  bruxism may be present.  Will add a muscle relaxer.  Recommended moist heat/compresses to the area.  Soft foods.  Handouts provided. Muscle tension pain -reproducible pain to the left side sternocleidomastoid muscle.  No knots appreciated.  No lymphadenopathy.  Full range of motion noted therefore will recommend muscle relaxer, prednisone, moist heat and massage.   Final Clinical Impressions(s) / UC Diagnoses   Final diagnoses:  Otitis media with effusion, left  TMJ (temporomandibular joint syndrome)  Muscle tension pain     Discharge Instructions      You have otitis media with effusion, which is fluid collection behind your eustachian tube. Please read the attached handouts. Please use the Flonase 1 spray up to twice daily until symptoms improve.  I have also prescribed you prednisone to help with your TMJ, middle ear, and neck symptoms. Please monitor your blood sugar while taking the prednisone as spikes can be seen.  Use a microwavable heating pack on your neck to help with the muscle tension. Take the baclofen up to 3 times daily as needed.  This medication may make you slightly tired, so take with caution and do not operate heavy machinery.  To help with possible TMJ, avoid super chewy foods.      ED Prescriptions     Medication Sig Dispense Auth. Provider   predniSONE (STERAPRED UNI-PAK 21 TAB) 10 MG (21) TBPK tablet Take by mouth daily. Take 6 tablets by mouth daily  for 1 day, then 5 tabs for 1 day, then 4 tabs for 1 day, then 3 tabs for 1 day, 2 tabs for 1 day, then 1 tab by mouth daily for 1 day 21 tablet Janace Decker L, PA   baclofen (LIORESAL) 10 MG tablet Take 1 tablet (10 mg total) by mouth 3 (three) times daily. 30 each Ednah Hammock L, PA   fluticasone (FLONASE) 50 MCG/ACT nasal spray Place 1 spray into both nostrils daily. 16 g Jennifier Smitherman L, [place]      PDMP not reviewed this encounter.   Chaney Malling, [place] 06/08/22 1057

## 2022-06-08 NOTE — Discharge Instructions (Addendum)
You have otitis media with effusion, which is fluid collection behind your eustachian tube. Please read the attached handouts. Please use the Flonase 1 spray up to twice daily until symptoms improve. I have also prescribed you prednisone to help with your TMJ, middle ear, and neck symptoms. Please monitor your blood sugar while taking the prednisone as spikes can be seen.  Use a microwavable heating pack on your neck to help with the muscle tension. Take the baclofen up to 3 times daily as needed.  This medication may make you slightly tired, so take with caution and do not operate heavy machinery.  To help with possible TMJ, avoid super chewy foods.

## 2022-06-08 NOTE — ED Triage Notes (Signed)
Patient c/o left ear pain and fullness x 4-5 days.  Pain radiates down into cheek and throat.  Patient has taken Tylenol.

## 2022-07-19 ENCOUNTER — Other Ambulatory Visit (HOSPITAL_COMMUNITY): Payer: Self-pay

## 2022-07-26 ENCOUNTER — Other Ambulatory Visit (HOSPITAL_COMMUNITY): Payer: Self-pay

## 2022-08-20 ENCOUNTER — Telehealth: Payer: 59 | Admitting: Nurse Practitioner

## 2022-08-20 DIAGNOSIS — J069 Acute upper respiratory infection, unspecified: Secondary | ICD-10-CM | POA: Diagnosis not present

## 2022-08-20 MED ORDER — FLUTICASONE PROPIONATE 50 MCG/ACT NA SUSP: 2.0000 | Freq: Every day | NASAL | 6 refills | Status: AC

## 2022-08-20 MED ORDER — BENZONATATE 100 MG PO CAPS: 100.0000 mg | ORAL_CAPSULE | Freq: Two times a day (BID) | ORAL | 0 refills | Status: DC | PRN

## 2022-08-20 NOTE — Progress Notes (Signed)

## 2022-09-01 ENCOUNTER — Other Ambulatory Visit (HOSPITAL_COMMUNITY): Payer: Self-pay

## 2022-09-01 ENCOUNTER — Other Ambulatory Visit: Payer: Self-pay | Admitting: Primary Care

## 2022-09-01 DIAGNOSIS — E782 Mixed hyperlipidemia: Secondary | ICD-10-CM

## 2022-09-01 MED ORDER — ATORVASTATIN CALCIUM 40 MG PO TABS
40.0000 mg | ORAL_TABLET | Freq: Every day | ORAL | 0 refills | Status: DC
  Filled 2022-09-01: qty 30, 30d supply, fill #0

## 2022-09-01 NOTE — Telephone Encounter (Signed)
Patient is due for CPE/follow up in May, this will be required prior to any further refills.  Please schedule, thank you!   

## 2022-09-02 NOTE — Telephone Encounter (Signed)
Spoke to pt, scheduled 09/28/22

## 2022-09-07 ENCOUNTER — Other Ambulatory Visit: Payer: Self-pay | Admitting: Primary Care

## 2022-09-07 ENCOUNTER — Other Ambulatory Visit (HOSPITAL_COMMUNITY): Payer: Self-pay

## 2022-09-07 DIAGNOSIS — E1159 Type 2 diabetes mellitus with other circulatory complications: Secondary | ICD-10-CM

## 2022-09-07 MED ORDER — LISINOPRIL 10 MG PO TABS
10.0000 mg | ORAL_TABLET | Freq: Every day | ORAL | 0 refills | Status: DC
  Filled 2022-09-07: qty 90, 90d supply, fill #0

## 2022-09-27 ENCOUNTER — Other Ambulatory Visit: Payer: Self-pay | Admitting: Primary Care

## 2022-09-27 DIAGNOSIS — Z0000 Encounter for general adult medical examination without abnormal findings: Secondary | ICD-10-CM

## 2022-09-28 ENCOUNTER — Ambulatory Visit (INDEPENDENT_AMBULATORY_CARE_PROVIDER_SITE_OTHER): Payer: 59 | Admitting: Primary Care

## 2022-09-28 ENCOUNTER — Other Ambulatory Visit (HOSPITAL_COMMUNITY): Payer: Self-pay

## 2022-09-28 ENCOUNTER — Encounter: Payer: Self-pay | Admitting: Primary Care

## 2022-09-28 VITALS — BP 112/62 | HR 96 | Temp 98.2°F | Ht 65.0 in | Wt 194.0 lb

## 2022-09-28 DIAGNOSIS — E782 Mixed hyperlipidemia: Secondary | ICD-10-CM

## 2022-09-28 DIAGNOSIS — Z7984 Long term (current) use of oral hypoglycemic drugs: Secondary | ICD-10-CM

## 2022-09-28 DIAGNOSIS — E1169 Type 2 diabetes mellitus with other specified complication: Secondary | ICD-10-CM

## 2022-09-28 DIAGNOSIS — E538 Deficiency of other specified B group vitamins: Secondary | ICD-10-CM

## 2022-09-28 DIAGNOSIS — E1159 Type 2 diabetes mellitus with other circulatory complications: Secondary | ICD-10-CM | POA: Diagnosis not present

## 2022-09-28 DIAGNOSIS — N289 Disorder of kidney and ureter, unspecified: Secondary | ICD-10-CM

## 2022-09-28 DIAGNOSIS — Z23 Encounter for immunization: Secondary | ICD-10-CM | POA: Diagnosis not present

## 2022-09-28 DIAGNOSIS — I152 Hypertension secondary to endocrine disorders: Secondary | ICD-10-CM

## 2022-09-28 DIAGNOSIS — F411 Generalized anxiety disorder: Secondary | ICD-10-CM

## 2022-09-28 DIAGNOSIS — E1165 Type 2 diabetes mellitus with hyperglycemia: Secondary | ICD-10-CM

## 2022-09-28 DIAGNOSIS — F339 Major depressive disorder, recurrent, unspecified: Secondary | ICD-10-CM

## 2022-09-28 DIAGNOSIS — Z0001 Encounter for general adult medical examination with abnormal findings: Secondary | ICD-10-CM

## 2022-09-28 DIAGNOSIS — R202 Paresthesia of skin: Secondary | ICD-10-CM | POA: Insufficient documentation

## 2022-09-28 LAB — COMPREHENSIVE METABOLIC PANEL
ALT: 19 U/L (ref 0–35)
AST: 17 U/L (ref 0–37)
Albumin: 4.5 g/dL (ref 3.5–5.2)
Alkaline Phosphatase: 35 U/L — ABNORMAL LOW (ref 39–117)
BUN: 23 mg/dL (ref 6–23)
CO2: 26 mEq/L (ref 19–32)
Calcium: 9.6 mg/dL (ref 8.4–10.5)
Chloride: 101 mEq/L (ref 96–112)
Creatinine, Ser: 1.11 mg/dL (ref 0.40–1.20)
GFR: 58.32 mL/min — ABNORMAL LOW (ref >60.00)
Glucose, Bld: 123 mg/dL — ABNORMAL HIGH (ref 70–99)
Potassium: 4.3 mEq/L (ref 3.5–5.1)
Sodium: 136 mEq/L (ref 135–145)
Total Bilirubin: 1.2 mg/dL (ref 0.2–1.2)
Total Protein: 7.4 g/dL (ref 6.0–8.3)

## 2022-09-28 LAB — LIPID PANEL
Cholesterol: 140 mg/dL (ref 0–200)
HDL: 44.9 mg/dL (ref >39.00)
LDL Cholesterol: 60 mg/dL (ref 0–99)
NonHDL: 95.35
Total CHOL/HDL Ratio: 3
Triglycerides: 177 mg/dL — ABNORMAL HIGH (ref 0.0–149.0)
VLDL: 35.4 mg/dL (ref 0.0–40.0)

## 2022-09-28 LAB — VITAMIN B12: Vitamin B-12: 678 pg/mL (ref 211–911)

## 2022-09-28 LAB — HEMOGLOBIN A1C: Hgb A1c MFr Bld: 6.7 % — ABNORMAL HIGH (ref 4.6–6.5)

## 2022-09-28 MED ORDER — VENLAFAXINE HCL ER 37.5 MG PO CP24
37.5000 mg | ORAL_CAPSULE | Freq: Every day | ORAL | 0 refills | Status: DC
  Filled 2022-09-28: qty 90, 90d supply, fill #0

## 2022-09-28 NOTE — Assessment & Plan Note (Signed)
Continue atorvastatin 40 mg daily. Repeat lipid panel pending. 

## 2022-09-28 NOTE — Progress Notes (Signed)
Subjective:    Patient ID: Erin Marquez, female    DOB: Nov 22, 1970, 52 y.o.   MRN: [id number]  HPI  Erin Marquez is a very pleasant 52 y.o. female who presents today for complete physical and follow up of chronic conditions.  She would also like to discuss several concerns. Her husband joins [place] today.  1) Paresthesias: Chronic to right upper extremity for the last 5-6 months. History of cervical fusion in March 2022. At the time she was told that her right upper extremity numbness may return from   2) Depression and Anxiety: Chronic since hysterectomy in October 2023. Symptoms include overacting, irritability, little motivation to do things, restlessness, difficulty sleeping, feeling sad since her children are growing up and become more independent, not wanting to leave her home, hot flashes, difficulty focusing, unable to make simple decisions, emotionally eating.   She's not wanting to socialize which is unlike her. She had Jury Duty in January 2024 which was a terrible experience. Previously managed on treatment, thinks it was Prozac, doesn't feel like it helped.   Immunizations: -Tetanus: Completed in 2016 -Shingles: Never completed -Pneumonia: 2021  Diet: Fair diet.  Exercise: No regular exercise.  Eye exam: Completes annually  Dental exam: Completes semi-annually    Pap Smear: August of 2021, hysterectomy in 2023 Mammogram: Completed in June 2023, scheduled for June 2024.   Colonoscopy: Completed in 2023, due 2030   BP Readings from Last 3 Encounters:  09/28/22 112/62  06/08/22 128/83  02/25/22 124/82         Review of Systems  Constitutional:  Negative for unexpected weight change.  HENT:  Negative for rhinorrhea.   Respiratory:  Negative for cough and shortness of breath.   Cardiovascular:  Negative for chest pain.  Gastrointestinal:  Negative for constipation and diarrhea.  Genitourinary:  Negative for difficulty urinating.       Hot flahes   Musculoskeletal:  Positive for arthralgias.  Skin:  Negative for rash.  Allergic/Immunologic: Negative for environmental allergies.  Neurological:  Negative for dizziness and headaches.  Psychiatric/Behavioral:  The patient is nervous/anxious.        See HPI         Past Medical History:  Diagnosis Date   Anxiety    Arthritis    Chronic neck pain 07/14/2020   Depression    PMH   Diabetes mellitus without complication (HCC)    type 2-on meds   Dog bite    left hand   History of kidney stones    Hyperlipidemia    Hypertension    Polycystic ovarian syndrome    Seasonal allergies     Social History   Socioeconomic History   Marital status: Married    Spouse name: Not on file   Number of children: Not on file   Years of education: Not on file   Highest education level: Not on file  Occupational History   Not on file  Tobacco Use   Smoking status: Never   Smokeless tobacco: Never  Vaping Use   Vaping Use: Never used  Substance and Sexual Activity   Alcohol use: Not Currently    Alcohol/week: 0.0 - 1.0 standard drinks of alcohol    Comment: Monthly   Drug use: No   Sexual activity: Yes    Birth control/protection: None, Surgical    Comment: vasectomy  Other Topics Concern   Not on file  Social History Narrative   Not on file  Social Determinants of Health   Financial Resource Strain: Not on file  Food Insecurity: Not on file  Transportation Needs: Not on file  Physical Activity: Not on file  Stress: Not on file  Social Connections: Not on file  Intimate Partner Violence: Not on file    Past Surgical History:  Procedure Laterality Date   CERVICAL FUSION  07/2020   CYSTOSCOPY/URETEROSCOPY/HOLMIUM LASER/STENT PLACEMENT Left 03/22/2019   Procedure: CYSTOSCOPY/URETEROSCOPY/STENT PLACEMENT/ BASKET STONE EXTRACTION/ LEFT RETROGRADE PYELOGRAM;  Surgeon: Rene Paci, MD;  Location: [facility];  Service: Urology;  Laterality:  Left;   HYMENECTOMY N/A 12/05/2018   Procedure: Vaginoplasty;  Surgeon: Reva Bores, MD;  Location: [facility];  Service: Gynecology;  Laterality: N/A;  Please have four 2-0 Vicryl on a CT-1 and of 0.25% Marcaine   HYSTERECTOMY ABDOMINAL WITH SALPINGECTOMY Bilateral 11/2020   I & D EXTREMITY Left 08/10/2019   Procedure: IRRIGATION AND DEBRIDEMENT LEFT SMALL FINGER AND LEFT HAND WOUNDS, PERCUTANEOUS PINNING LEFT SMALL FINGER;  Surgeon: Ernest Mallick, MD;  Location: [facility] OR;  Service: Orthopedics;  Laterality: Left;   VAGINA SURGERY  2010   extra skin removed   WISDOM TOOTH EXTRACTION      Family History  Problem Relation Age of Onset   Hypertension Mother    Stroke Mother    Diabetes Father    Hyperthyroidism Father    Prostate cancer Father    Anxiety disorder Daughter    Colon cancer Neg Hx    Colon polyps Neg Hx    Esophageal cancer Neg Hx    Stomach cancer Neg Hx    Rectal cancer Neg Hx     Allergies  Allergen Reactions   Peanut Oil Anaphylaxis    Slurred speech Slurred speech   Codeine Nausea Only and Nausea And Vomiting    Current Outpatient Medications on File Prior to Visit  Medication Sig Dispense Refill   atorvastatin (LIPITOR) 40 MG tablet Take 1 tablet (40 mg total) by mouth daily. for cholesterol. 30 tablet 0   baclofen (LIORESAL) 10 MG tablet Take 1 tablet (10 mg total) by mouth 3 (three) times daily. 30 each 0   blood glucose meter kit and supplies KIT Dispense based on patient and insurance preference. Use up to four times daily as directed. (FOR ICD-9 250.00, 250.01). 1 each 0   Blood Glucose Monitoring Suppl (FREESTYLE LITE) DEVI      Cholecalciferol (VITAMIN D3) 5000 units CAPS Take 1 capsule by mouth daily.     cyanocobalamin 1000 MCG tablet Take 1,000 mcg by mouth daily.     cyclobenzaprine (FLEXERIL) 5 MG tablet Take 5 mg by mouth as needed for muscle spasms.     fexofenadine (ALLEGRA) 60 MG tablet Take 1 tablet by mouth  daily.     fluticasone (FLONASE) 50 MCG/ACT nasal spray Place 2 sprays into both nostrils daily. 16 g 6   glucose blood (FREESTYLE LITE) test strip Use as instructed to test blood sugar up to 4 times daily 300 each 2   Lancets (FREESTYLE) lancets      lisinopril (ZESTRIL) 10 MG tablet Take 1 tablet by mouth daily for blood pressure. 90 tablet 0   metFORMIN (GLUCOPHAGE-XR) 500 MG 24 hr tablet Take 2 tablets by mouth with breakfast and 1 tablet by mouth after dinner. 270 tablet 3   Multiple Vitamin (MULTIVITAMIN PO) Take 1 capsule by mouth daily.     nystatin-triamcinolone ointment (MYCOLOG) Apply topically daily  as needed. 60 g 2   Omega-3 Fatty Acids (FISH OIL) 1200 MG CAPS 2 cap twice daily (Patient taking differently: Take 1 capsule by mouth daily at 6 (six) AM.)     OZEMPIC, 0.25 OR 0.5 MG/DOSE, 2 MG/3ML SOPN Inject 0.5 mg into the skin once weekly as directed. 3 mL 1   pioglitazone (ACTOS) 30 MG tablet Take 1 tablet by mouth daily. 90 tablet 3   triamcinolone cream (KENALOG) 0.1 % Apply 1 application topically 2 (two) times daily as needed. 30 g 0   OVER THE COUNTER MEDICATION daily as needed. (Patient not taking: Reported on 09/28/2022)     No current facility-administered medications on file prior to visit.    BP 112/62   Pulse 96   Temp 98.2 F (36.8 C) (Temporal)   Ht 5\' 5"  (1.651 m)   Wt 194 lb (88 kg)   LMP 10/27/2020   SpO2 99%   BMI 32.28 kg/m  Objective:   Physical Exam HENT:     Right Ear: Tympanic membrane and ear canal normal.     Left Ear: Tympanic membrane and ear canal normal.     Nose: Nose normal.  Eyes:     Conjunctiva/sclera: Conjunctivae normal.     Pupils: Pupils are equal, round, and reactive to light.  Neck:     Thyroid: No thyromegaly.  Cardiovascular:     Rate and Rhythm: Normal rate and regular rhythm.     Heart sounds: No murmur heard. Pulmonary:     Effort: Pulmonary effort is normal.     Breath sounds: Normal breath sounds. No rales.   Abdominal:     General: Bowel sounds are normal.     Palpations: Abdomen is soft.     Tenderness: There is no abdominal tenderness.  Musculoskeletal:        General: Normal range of motion.     Cervical back: Neck supple.  Lymphadenopathy:     Cervical: No cervical adenopathy.  Skin:    General: Skin is warm and dry.     Findings: No rash.  Neurological:     Mental Status: She is alert and oriented to person, place, and time.     Cranial Nerves: No cranial nerve deficit.     Deep Tendon Reflexes: Reflexes are normal and symmetric.  Psychiatric:        Mood and Affect: Mood normal.     Comments: Tearful once during visit           Assessment & Plan:  Encounter for annual general medical examination with abnormal findings in adult Assessment & Plan: First Shingrix vaccine due, provided today. Pap smear UTD, also no longer needing given hysterectomy Mammogram scheduled for June Colonoscopy UTD, due 2030  Discussed the importance of a healthy diet and regular exercise in order for weight loss, and to reduce the risk of further co-morbidity.  Exam stable. Labs pending.  Follow up in 1 year for repeat physical.    Type 2 diabetes mellitus with hyperglycemia, without long-term current use of insulin [facility]) Assessment & Plan: Following with endocrinology. Reviewed A1C from October 2023 through Care Everywhere.  Repeat A1C pending.  Continue Actos 30 mg daily, Ozempic 0.5 mg weekly, metformin XR 1000 mg in AM and 500 mg in PM.   Orders: -     Hemoglobin A1c  Hypertension associated with diabetes (HCC) Assessment & Plan: Controlled.  Continue lisinopril 10 mg daily. CMP pending.  Orders: -  Comprehensive metabolic panel  Mixed diabetic hyperlipidemia associated with type 2 diabetes mellitus (HCC) Assessment & Plan: Continue atorvastatin 40 mg daily. Repeat lipid panel pending  Orders: -     Lipid panel -     Comprehensive metabolic panel  Vitamin  B12 deficiency -     Vitamin B12  GAD (generalized anxiety disorder) Assessment & Plan: Deteriorated.  Long discussion today regarding options. She declines therapy but opts for medication treatment. Would recommend Zoloft but she is already having decreased sexual libido.  Given her hot flashes, coupled with anxiety/depression, will start Effexor ER 37.5 mg daily. Discussed common side effects.  Follow up in 1 month.  Orders: -     Venlafaxine HCl ER; Take 1 capsule (37.5 mg total) by mouth daily with breakfast. for anxiety and depression.  Dispense: 90 capsule; Refill: 0  Depression, recurrent (HCC) Assessment & Plan: Deteriorated.  Long discussion today regarding options. She declines therapy but opts for medication treatment. Would recommend Zoloft but she is already having decreased sexual libido.  Given her hot flashes, coupled with anxiety/depression, will start Effexor ER 37.5 mg daily. Discussed common side effects.  Follow up in 1 month.  Orders: -     Venlafaxine HCl ER; Take 1 capsule (37.5 mg total) by mouth daily with breakfast. for anxiety and depression.  Dispense: 90 capsule; Refill: 0  Paresthesias Assessment & Plan: Referral placed for neurosurgery.  Orders: -     Ambulatory referral to Neurosurgery  Encounter for immunization -     Varicella-zoster vaccine IM  Decreased renal function Assessment & Plan: Reviewed renal ultrasound from 2023 with patient today. Repeat renal function pending.         Doreene Nest, NP

## 2022-09-28 NOTE — Assessment & Plan Note (Signed)
Reviewed renal ultrasound from 2023 with patient today. Repeat renal function pending.

## 2022-09-28 NOTE — Assessment & Plan Note (Addendum)
Following with endocrinology. Reviewed A1C from October 2023 through Care Everywhere.  Repeat A1C pending.  Continue Actos 30 mg daily, Ozempic 0.5 mg weekly, metformin XR 1000 mg in AM and 500 mg in PM.

## 2022-09-28 NOTE — Assessment & Plan Note (Signed)
Deteriorated.  Long discussion today regarding options. She declines therapy but opts for medication treatment. Would recommend Zoloft but she is already having decreased sexual libido.  Given her hot flashes, coupled with anxiety/depression, will start Effexor ER 37.5 mg daily. Discussed common side effects.  Follow up in 1 month. 

## 2022-09-28 NOTE — Assessment & Plan Note (Signed)
First Shingrix vaccine due, provided today. Pap smear UTD, also no longer needing given hysterectomy Mammogram scheduled for June Colonoscopy UTD, due 2030  Discussed the importance of a healthy diet and regular exercise in order for weight loss, and to reduce the risk of further co-morbidity.  Exam stable. Labs pending.  Follow up in 1 year for repeat physical.

## 2022-09-28 NOTE — Assessment & Plan Note (Signed)
Deteriorated.  Long discussion today regarding options. She declines therapy but opts for medication treatment. Would recommend Zoloft but she is already having decreased sexual libido.  Given her hot flashes, coupled with anxiety/depression, will start Effexor ER 37.5 mg daily. Discussed common side effects.  Follow up in 1 month.

## 2022-09-28 NOTE — Assessment & Plan Note (Signed)
Referral placed for neurosurgery.

## 2022-09-28 NOTE — Assessment & Plan Note (Signed)
Controlled. ? ?Continue lisinopril 10 mg daily. ?CMP pending. ?

## 2022-09-28 NOTE — Patient Instructions (Addendum)
Stop by the lab prior to leaving today. I will notify you of your results once received.   You will either be contacted via phone regarding your referral to neurosurgery, or you may receive a letter on your MyChart portal from our referral team with instructions for scheduling an appointment. Please let us know if you have not been contacted by anyone within two weeks.  Start venlafaxine ER 37.5 mg daily. Take 1 capsule by mouth every morning with breakfast for anxiety/depression.  Schedule a nurse visit for 2-6 months to complete your final Shingles vaccine.  Please schedule a follow up visit for 1 month.  It was a pleasure to see you today!

## 2022-09-29 ENCOUNTER — Other Ambulatory Visit: Payer: Self-pay

## 2022-10-04 ENCOUNTER — Encounter: Payer: Self-pay | Admitting: *Deleted

## 2022-10-11 ENCOUNTER — Other Ambulatory Visit: Payer: Self-pay | Admitting: Primary Care

## 2022-10-11 ENCOUNTER — Other Ambulatory Visit (HOSPITAL_COMMUNITY): Payer: Self-pay

## 2022-10-11 DIAGNOSIS — G5600 Carpal tunnel syndrome, unspecified upper limb: Secondary | ICD-10-CM | POA: Diagnosis not present

## 2022-10-11 DIAGNOSIS — M5412 Radiculopathy, cervical region: Secondary | ICD-10-CM | POA: Diagnosis not present

## 2022-10-11 DIAGNOSIS — E1169 Type 2 diabetes mellitus with other specified complication: Secondary | ICD-10-CM

## 2022-10-11 MED ORDER — ATORVASTATIN CALCIUM 40 MG PO TABS
40.0000 mg | ORAL_TABLET | Freq: Every day | ORAL | 3 refills | Status: DC
  Filled 2022-10-11: qty 90, 90d supply, fill #0
  Filled 2023-01-10: qty 90, 90d supply, fill #1
  Filled 2023-04-12: qty 90, 90d supply, fill #2
  Filled 2023-07-23: qty 90, 90d supply, fill #3

## 2022-10-11 MED ORDER — METHYLPREDNISOLONE 4 MG PO TBPK
ORAL_TABLET | ORAL | 0 refills | Status: DC
  Filled 2022-10-11: qty 21, 6d supply, fill #0

## 2022-10-15 DIAGNOSIS — G5601 Carpal tunnel syndrome, right upper limb: Secondary | ICD-10-CM | POA: Diagnosis not present

## 2022-10-18 ENCOUNTER — Other Ambulatory Visit (HOSPITAL_COMMUNITY): Payer: Self-pay

## 2022-10-18 MED ORDER — METFORMIN HCL ER 500 MG PO TB24
ORAL_TABLET | ORAL | 3 refills | Status: DC
  Filled 2022-10-18: qty 270, 90d supply, fill #0
  Filled 2023-01-24: qty 270, 90d supply, fill #1
  Filled 2023-04-30: qty 270, 90d supply, fill #2
  Filled 2023-08-04: qty 270, 90d supply, fill #3

## 2022-10-18 MED ORDER — PIOGLITAZONE HCL 30 MG PO TABS
30.0000 mg | ORAL_TABLET | Freq: Every day | ORAL | 3 refills | Status: DC
  Filled 2022-10-18: qty 90, 90d supply, fill #0
  Filled 2023-01-18: qty 90, 90d supply, fill #1
  Filled 2023-04-23: qty 90, 90d supply, fill #2
  Filled 2023-07-23: qty 90, 90d supply, fill #3

## 2022-10-24 DIAGNOSIS — H52223 Regular astigmatism, bilateral: Secondary | ICD-10-CM | POA: Diagnosis not present

## 2022-10-24 DIAGNOSIS — E119 Type 2 diabetes mellitus without complications: Secondary | ICD-10-CM | POA: Diagnosis not present

## 2022-10-24 DIAGNOSIS — H524 Presbyopia: Secondary | ICD-10-CM | POA: Diagnosis not present

## 2022-10-24 DIAGNOSIS — H5213 Myopia, bilateral: Secondary | ICD-10-CM | POA: Diagnosis not present

## 2022-10-25 DIAGNOSIS — M5412 Radiculopathy, cervical region: Secondary | ICD-10-CM | POA: Diagnosis not present

## 2022-11-01 ENCOUNTER — Ambulatory Visit
Admission: RE | Admit: 2022-11-01 | Discharge: 2022-11-01 | Disposition: A | Discharge status: Home / Self Care | Payer: 59 | Source: Ambulatory Visit | Attending: Primary Care | Admitting: Primary Care

## 2022-11-01 DIAGNOSIS — Z0000 Encounter for general adult medical examination without abnormal findings: Secondary | ICD-10-CM

## 2022-11-01 DIAGNOSIS — Z1231 Encounter for screening mammogram for malignant neoplasm of breast: Secondary | ICD-10-CM | POA: Diagnosis not present

## 2022-11-02 ENCOUNTER — Other Ambulatory Visit (HOSPITAL_COMMUNITY): Payer: Self-pay

## 2022-11-02 ENCOUNTER — Ambulatory Visit: Payer: 59 | Admitting: Primary Care

## 2022-11-02 ENCOUNTER — Encounter: Payer: Self-pay | Admitting: Primary Care

## 2022-11-02 VITALS — BP 106/60 | HR 80 | Temp 97.9°F | Ht 65.0 in | Wt 195.0 lb

## 2022-11-02 DIAGNOSIS — F411 Generalized anxiety disorder: Secondary | ICD-10-CM

## 2022-11-02 DIAGNOSIS — R21 Rash and other nonspecific skin eruption: Secondary | ICD-10-CM

## 2022-11-02 DIAGNOSIS — N951 Menopausal and female climacteric states: Secondary | ICD-10-CM | POA: Diagnosis not present

## 2022-11-02 DIAGNOSIS — F339 Major depressive disorder, recurrent, unspecified: Secondary | ICD-10-CM | POA: Diagnosis not present

## 2022-11-02 MED ORDER — TRIAMCINOLONE ACETONIDE 0.1 % EX CREA
1.0000 | TOPICAL_CREAM | Freq: Two times a day (BID) | CUTANEOUS | 0 refills | Status: AC | PRN
  Filled 2022-11-02: qty 30, 15d supply, fill #0

## 2022-11-02 MED ORDER — PREDNISONE 20 MG PO TABS
40.0000 mg | ORAL_TABLET | Freq: Every morning | ORAL | 0 refills | Status: DC
  Filled 2022-11-02: qty 10, 5d supply, fill #0

## 2022-11-02 MED ORDER — VENLAFAXINE HCL ER 75 MG PO CP24
75.0000 mg | ORAL_CAPSULE | Freq: Every day | ORAL | 0 refills | Status: DC
  Filled 2022-11-02: qty 90, 90d supply, fill #0

## 2022-11-02 NOTE — Assessment & Plan Note (Signed)
Increase venlafaxine ER to 75 mg daily. She will update.  Ultimately, she may need hormone replacement therapy.

## 2022-11-02 NOTE — Assessment & Plan Note (Signed)
Improved but not at goal.  We discussed options. We agreed to increase her dose of venlafaxine ER to 75 mg. New prescription sent to pharmacy.  She will update in 1 month or sooner if she experiences any problems.

## 2022-11-02 NOTE — Assessment & Plan Note (Signed)
Improved but not at goal.  We discussed options. We agreed to increase her dose of venlafaxine ER to 75 mg. New prescription sent to pharmacy.  She will update in 1 month or sooner if she experiences any problems.   

## 2022-11-02 NOTE — Patient Instructions (Signed)
Start prednisone 20 mg tablets. Take 2 tablets by mouth once daily in the morning for 5 days.  You may use the triamcinolone cream twice daily as needed for the rash.  We increased your venlafaxine dose to 75 mg daily.  I sent a new prescription to your pharmacy.  Please update me again in 1 month.  It was a pleasure to see you today!

## 2022-11-02 NOTE — Progress Notes (Signed)
Subjective:    Patient ID: Erin Marquez, female    DOB: March 12, 1971, 52 y.o.   MRN: [id number]  Anxiety Symptoms include nervous/anxious behavior.    Rash    Erin Marquez is a very pleasant 52 y.o. female with a history of type 2 diabetes, depression, hyperlipidemia, PCOS, hypertension, anxiety, fatigue, perimenopause who presents today for follow-up of anxiety and depression and to discuss rash.  1) Anxiety/Depression:   She was last evaluated 09/28/2022 for her annual exam.  During this visit she mentioned chronic symptoms of irritability, little motivation to do things, restlessness, sleep disturbance, feeling down/sad, not wanting to leave home, menopausal symptoms of hot flashes, emotional eating.  Given her symptoms we discussed options and decided to initiate venlafaxine ER 37.5 mg daily for anxiety/depression/vasomotor symptoms of menopause.  She is here for follow-up today.  Since her last visit she is compliant to venlafaxine ER 37.5 mg daily. She has noticed a slight improvement in her mood, has felt more joyful, recently read a book for which she enjoyed. She and her family went on a vacation last weekend and she enjoyed her time. She does continue to notice anxiety in large crowds and she is worried about missing her daughter as she will be going to college in 1 year. Her mother has noticed an improvement.   She hasn't noticed an improvement in her hot flashes. She denies SI/HI, nausea, headaches.   2) Rash:   Acute onset one week ago and located to the anterior chest, abdomen, right axilla, and posterior trunk. Symptoms began because of mosquito bites for which she witnessed. She's been applying mupirocin ointment without much improvement. She then began triamcinolone 0.025% cream and taking Benadryl with some improvement. She is nearly out of her cream.  Her rash is itchy and bothersome at night while sleeping.   Review of Systems  Genitourinary:        Hot flashes   Musculoskeletal:  Positive for arthralgias.  Skin:  Positive for rash.  Psychiatric/Behavioral:  The patient is nervous/anxious.          Past Medical History:  Diagnosis Date   Anxiety    Arthritis    Chronic neck pain 07/14/2020   Depression    PMH   Diabetes mellitus without complication (HCC)    type 2-on meds   Dog bite    left hand   History of kidney stones    Hyperlipidemia    Hypertension    Polycystic ovarian syndrome    Seasonal allergies     Social History   Socioeconomic History   Marital status: Married    Spouse name: Not on file   Number of children: Not on file   Years of education: Not on file   Highest education level: Not on file  Occupational History   Not on file  Tobacco Use   Smoking status: Never   Smokeless tobacco: Never  Vaping Use   Vaping Use: Never used  Substance and Sexual Activity   Alcohol use: Not Currently    Alcohol/week: 0.0 - 1.0 standard drinks of alcohol    Comment: Monthly   Drug use: No   Sexual activity: Yes    Birth control/protection: None, Surgical    Comment: vasectomy  Other Topics Concern   Not on file  Social History Narrative   Not on file   Social Determinants of Health   Financial Resource Strain: Not on file  Food Insecurity: Not on  file  Transportation Needs: Not on file  Physical Activity: Not on file  Stress: Not on file  Social Connections: Not on file  Intimate Partner Violence: Not on file    Past Surgical History:  Procedure Laterality Date   CERVICAL FUSION  07/2020   CYSTOSCOPY/URETEROSCOPY/HOLMIUM LASER/STENT PLACEMENT Left 03/22/2019   Procedure: CYSTOSCOPY/URETEROSCOPY/STENT PLACEMENT/ BASKET STONE EXTRACTION/ LEFT RETROGRADE PYELOGRAM;  Surgeon: Rene Paci, MD;  Location: [facility];  Service: Urology;  Laterality: Left;   HYMENECTOMY N/A 12/05/2018   Procedure: Vaginoplasty;  Surgeon: Reva Bores, MD;  Location: [facility];   Service: Gynecology;  Laterality: N/A;  Please have four 2-0 Vicryl on a CT-1 and of 0.25% Marcaine   HYSTERECTOMY ABDOMINAL WITH SALPINGECTOMY Bilateral 11/2020   I & D EXTREMITY Left 08/10/2019   Procedure: IRRIGATION AND DEBRIDEMENT LEFT SMALL FINGER AND LEFT HAND WOUNDS, PERCUTANEOUS PINNING LEFT SMALL FINGER;  Surgeon: Ernest Mallick, MD;  Location: [facility];  Service: Orthopedics;  Laterality: Left;   VAGINA SURGERY  2010   extra skin removed   WISDOM TOOTH EXTRACTION      Family History  Problem Relation Age of Onset   Hypertension Mother    Stroke Mother    Diabetes Father    Hyperthyroidism Father    Prostate cancer Father    Anxiety disorder Daughter    Colon cancer Neg Hx    Colon polyps Neg Hx    Esophageal cancer Neg Hx    Stomach cancer Neg Hx    Rectal cancer Neg Hx     Allergies  Allergen Reactions   Peanut Oil Anaphylaxis    Slurred speech Slurred speech   Codeine Nausea Only and Nausea And Vomiting    Current Outpatient Medications on File Prior to Visit  Medication Sig Dispense Refill   atorvastatin (LIPITOR) 40 MG tablet Take 1 tablet (40 mg total) by mouth daily. for cholesterol. 90 tablet 3   baclofen (LIORESAL) 10 MG tablet Take 1 tablet (10 mg total) by mouth 3 (three) times daily. 30 each 0   blood glucose meter kit and supplies KIT Dispense based on patient and insurance preference. Use up to four times daily as directed. (FOR ICD-9 250.00, 250.01). 1 each 0   Blood Glucose Monitoring Suppl (FREESTYLE LITE) DEVI      Cholecalciferol (VITAMIN D3) 5000 units CAPS Take 1 capsule by mouth daily.     cyanocobalamin 1000 MCG tablet Take 1,000 mcg by mouth daily.     cyclobenzaprine (FLEXERIL) 10 MG tablet Take 10 mg by mouth as needed for muscle spasms.     fexofenadine (ALLEGRA) 60 MG tablet Take 1 tablet by mouth daily.     glucose blood (FREESTYLE LITE) test strip Use as instructed to test blood sugar up to 4 times daily 300 each 2    Lancets (FREESTYLE) lancets      lisinopril (ZESTRIL) 10 MG tablet Take 1 tablet by mouth daily for blood pressure. 90 tablet 0   metFORMIN (GLUCOPHAGE-XR) 500 MG 24 hr tablet Take 2 tablets by mouth with breakfast and 1 tablet by mouth after dinner. 270 tablet 3   Multiple Vitamin (MULTIVITAMIN PO) Take 1 capsule by mouth daily.     nystatin-triamcinolone ointment (MYCOLOG) Apply topically daily as needed. 60 g 2   Omega-3 Fatty Acids (FISH OIL) 1200 MG CAPS 2 cap twice daily (Patient taking differently: Take 1 capsule by mouth daily at 6 (six) AM.)  OZEMPIC, 0.25 OR 0.5 MG/DOSE, 2 MG/3ML SOPN Inject 0.5 mg into the skin once a week. 3 mL 1   pioglitazone (ACTOS) 30 MG tablet Take 1 tablet by mouth daily. 90 tablet 3   fluticasone (FLONASE) 50 MCG/ACT nasal spray Place 2 sprays into both nostrils daily. (Patient not taking: Reported on 11/02/2022) 16 g 6   OVER THE COUNTER MEDICATION daily as needed. (Patient not taking: Reported on 09/28/2022)     No current facility-administered medications on file prior to visit.    BP 106/60   Pulse 80   Temp 97.9 F (36.6 C) (Temporal)   Ht 5\' 5"  (1.651 m)   Wt 195 lb (88.5 kg)   LMP 10/27/2020   SpO2 98%   BMI 32.45 kg/m  Objective:   Physical Exam Cardiovascular:     Rate and Rhythm: Normal rate and regular rhythm.  Pulmonary:     Effort: Pulmonary effort is normal.     Breath sounds: Normal breath sounds.  Musculoskeletal:     Cervical back: Neck supple.  Skin:    General: Skin is warm and dry.     Findings: Rash present.     Comments: Mildly erythematous rash to right upper abdomen, mid thoracic spine, right axilla  Psychiatric:     Comments: Much improved since last visit           Assessment & Plan:  GAD (generalized anxiety disorder) Assessment & Plan: Improved but not at goal.  We discussed options. We agreed to increase her dose of venlafaxine ER to 75 mg. New prescription sent to pharmacy.  She will update in 1  month or sooner if she experiences any problems.   Orders: -     Venlafaxine HCl ER; Take 1 capsule (75 mg total) by mouth daily with breakfast. for anxiety and depression.  Dispense: 90 capsule; Refill: 0  Depression, recurrent (HCC) Assessment & Plan: Improved but not at goal.  We discussed options. We agreed to increase her dose of venlafaxine ER to 75 mg. New prescription sent to pharmacy.  She will update in 1 month or sooner if she experiences any problems.    Orders: -     Venlafaxine HCl ER; Take 1 capsule (75 mg total) by mouth daily with breakfast. for anxiety and depression.  Dispense: 90 capsule; Refill: 0  Rash and nonspecific skin eruption Assessment & Plan: Appears reactionary and pruritic.  Start prednisone 20 mg tablets. Take 2 tablets by mouth once daily in the morning for 5 days. Start triamcinolone 0.1% cream, apply twice daily as needed.  Orders: -     Triamcinolone Acetonide; Apply 1 application topically 2 (two) times daily as needed.  Dispense: 30 g; Refill: 0 -     predniSONE; Take 2 tablets by mouth once daily in the morning for 5 days.  Dispense: 10 tablet; Refill: 0  Vasomotor symptoms due to menopause Assessment & Plan: Increase venlafaxine ER to 75 mg daily. She will update.  Ultimately, she may need hormone replacement therapy.         Doreene Nest, NP

## 2022-11-02 NOTE — Assessment & Plan Note (Signed)
Appears reactionary and pruritic.  Start prednisone 20 mg tablets. Take 2 tablets by mouth once daily in the morning for 5 days. Start triamcinolone 0.1% cream, apply twice daily as needed.

## 2022-11-03 ENCOUNTER — Ambulatory Visit
Admission: EM | Admit: 2022-11-03 | Discharge: 2022-11-03 | Disposition: A | Discharge status: Home / Self Care | Payer: 59 | Attending: Family Medicine | Admitting: Family Medicine

## 2022-11-03 ENCOUNTER — Other Ambulatory Visit (HOSPITAL_COMMUNITY): Payer: Self-pay

## 2022-11-03 DIAGNOSIS — N200 Calculus of kidney: Secondary | ICD-10-CM

## 2022-11-03 DIAGNOSIS — R310 Gross hematuria: Secondary | ICD-10-CM

## 2022-11-03 DIAGNOSIS — M5412 Radiculopathy, cervical region: Secondary | ICD-10-CM | POA: Diagnosis not present

## 2022-11-03 DIAGNOSIS — G5600 Carpal tunnel syndrome, unspecified upper limb: Secondary | ICD-10-CM | POA: Diagnosis not present

## 2022-11-03 DIAGNOSIS — R109 Unspecified abdominal pain: Secondary | ICD-10-CM | POA: Diagnosis not present

## 2022-11-03 LAB — POCT URINALYSIS DIP (MANUAL ENTRY)
Glucose, UA: 100 mg/dL — AB
Leukocytes, UA: NEGATIVE
Nitrite, UA: NEGATIVE
Protein Ur, POC: 100 mg/dL — AB
Spec Grav, UA: >=1.03 — AB (ref 1.010–1.025)
Urobilinogen, UA: 0.2 E.U./dL
pH, UA: 5.5 (ref 5.0–8.0)

## 2022-11-03 MED ORDER — ONDANSETRON HCL 8 MG PO TABS: 8.0000 mg | ORAL_TABLET | Freq: Three times a day (TID) | ORAL | 0 refills | Status: DC | PRN

## 2022-11-03 MED ORDER — TAMSULOSIN HCL 0.4 MG PO CAPS: 0.4000 mg | ORAL_CAPSULE | Freq: Every day | ORAL | 1 refills | Status: DC

## 2022-11-03 MED ORDER — ONDANSETRON 4 MG PO TBDP
4.0000 mg | ORAL_TABLET | Freq: Once | ORAL | Status: AC
  Administered 2022-11-03: 4 mg via ORAL

## 2022-11-03 MED ORDER — KETOROLAC TROMETHAMINE 10 MG PO TABS: 10.0000 mg | ORAL_TABLET | Freq: Four times a day (QID) | ORAL | 0 refills | Status: AC | PRN

## 2022-11-03 MED ORDER — KETOROLAC TROMETHAMINE 30 MG/ML IJ SOLN
30.0000 mg | Freq: Once | INTRAMUSCULAR | Status: AC
  Administered 2022-11-03: 30 mg via INTRAMUSCULAR

## 2022-11-03 NOTE — ED Provider Notes (Signed)
[facility]    CSN: [id number] Arrival date & time: 11/03/22  1845      History   Chief Complaint Chief Complaint  Patient presents with   Abdominal Pain    Chills frequent urinationSuspect kidney stone - Entered by patient    HPI Erin Marquez is a 52 y.o. female.   HPI  Patient states that she felt well this morning.  She had a routine physicians appointment.  Her medication was adjusted.  She states that she took a nap and then got up around 3:00.  When she got up she was not feeling well.  She felt tired and a little queasy.  She states she started having shaking chills.  Since 3:00 this afternoon she has continued to have shaking chills, nausea, and has flank pain on the right side is radiating around to her lower abdomen.  She states she feels like she is passing a kidney stone.  She states she has had kidney stones in the past.  She has not had any vomiting or diarrhea..  Past Medical History:  Diagnosis Date   Anxiety    Arthritis    Chronic neck pain 07/14/2020   Depression    PMH   Diabetes mellitus without complication (HCC)    type 2-on meds   Dog bite    left hand   History of kidney stones    Hyperlipidemia    Hypertension    Polycystic ovarian syndrome    Seasonal allergies     Patient Active Problem List   Diagnosis Date Noted   Paresthesias 09/28/2022   Vasomotor symptoms due to menopause 12/22/2021   GAD (generalized anxiety disorder) 09/08/2021   Chronic fatigue 09/08/2021   Decreased renal function 07/10/2019   Encounter for annual general medical examination with abnormal findings in adult 07/10/2019   Rash and nonspecific skin eruption 12/27/2018   Abnormal TSH 07/04/2018   Seborrheic keratosis 09/06/2017   PCOS (polycystic ovarian syndrome) 06/13/2017   Type 2 diabetes mellitus with hyperglycemia (HCC) 06/13/2017   Hypertension associated with diabetes (HCC) 06/13/2017   Mixed diabetic hyperlipidemia associated with type 2  diabetes mellitus (HCC) 06/13/2017   Vitamin D deficiency 06/13/2017   Environmental and seasonal allergies 06/13/2017   Chronic Arthralgias- bilateral hands and feet 06/13/2017   Family history of rheumatoid arthritis 06/13/2017   SK (solar keratosis)- two on face 06/13/2017   Elevated liver enzymes- ALT and AST since  06/13/2017   Depression, recurrent (HCC) 06/16/2014   Sciatica 05/13/2014    Past Surgical History:  Procedure Laterality Date   CERVICAL FUSION  07/2020   CYSTOSCOPY/URETEROSCOPY/HOLMIUM LASER/STENT PLACEMENT Left 03/22/2019   Procedure: CYSTOSCOPY/URETEROSCOPY/STENT PLACEMENT/ BASKET STONE EXTRACTION/ LEFT RETROGRADE PYELOGRAM;  Surgeon: Rene Paci, MD;  Location: [facility];  Service: Urology;  Laterality: Left;   HYMENECTOMY N/A 12/05/2018   Procedure: Vaginoplasty;  Surgeon: Reva Bores, MD;  Location: [facility];  Service: Gynecology;  Laterality: N/A;  Please have four 2-0 Vicryl on a CT-1 and of 0.25% Marcaine   HYSTERECTOMY ABDOMINAL WITH SALPINGECTOMY Bilateral 11/2020   I & D EXTREMITY Left 08/10/2019   Procedure: IRRIGATION AND DEBRIDEMENT LEFT SMALL FINGER AND LEFT HAND WOUNDS, PERCUTANEOUS PINNING LEFT SMALL FINGER;  Surgeon: Ernest Mallick, MD;  Location: [facility];  Service: Orthopedics;  Laterality: Left;   VAGINA SURGERY  2010   extra skin removed   WISDOM TOOTH EXTRACTION      OB History  Gravida  2   Para  2   Term  2   Preterm      AB      Living  2      SAB      IAB      Ectopic      Multiple      Live Births               Home Medications    Prior to Admission medications   Medication Sig Start Date End Date Taking? Authorizing Provider  ketorolac (TORADOL) 10 MG tablet Take 1 tablet (10 mg total) by mouth every 6 (six) hours as needed. 11/03/22  Yes Eustace Moore, MD  ondansetron (ZOFRAN) 8 MG tablet Take 1 tablet (8 mg total) by mouth every 8  (eight) hours as needed for nausea or vomiting. 11/03/22  Yes Eustace Moore, MD  tamsulosin (FLOMAX) 0.4 MG CAPS capsule Take 1 capsule (0.4 mg total) by mouth daily after supper. 11/03/22  Yes Eustace Moore, MD  atorvastatin (LIPITOR) 40 MG tablet Take 1 tablet (40 mg total) by mouth daily. for cholesterol. 10/11/22   Doreene Nest, NP  baclofen (LIORESAL) 10 MG tablet Take 1 tablet (10 mg total) by mouth 3 (three) times daily. 06/08/22   Crain, Whitney L, PA  blood glucose meter kit and supplies KIT Dispense based on patient and insurance preference. Use up to four times daily as directed. (FOR ICD-9 250.00, 250.01). 07/04/18   Doreene Nest, NP  Blood Glucose Monitoring Suppl (FREESTYLE LITE) DEVI  07/04/18   [provider]  Cholecalciferol (VITAMIN D3) 5000 units CAPS Take 1 capsule by mouth daily.    [provider]  cyanocobalamin 1000 MCG tablet Take 1,000 mcg by mouth daily.    [provider]  cyclobenzaprine (FLEXERIL) 10 MG tablet Take 10 mg by mouth as needed for muscle spasms.    [provider]  fexofenadine (ALLEGRA) 60 MG tablet Take 1 tablet by mouth daily.    [provider]  fluticasone (FLONASE) 50 MCG/ACT nasal spray Place 2 sprays into both nostrils daily. Patient not taking: Reported on 11/02/2022 08/20/22   Bennie Pierini, FNP  glucose blood (FREESTYLE LITE) test strip Use as instructed to test blood sugar up to 4 times daily 07/25/18   Doreene Nest, NP  Lancets (FREESTYLE) lancets  07/04/18   [provider]  lisinopril (ZESTRIL) 10 MG tablet Take 1 tablet by mouth daily for blood pressure. 09/07/22 09/07/23  Doreene Nest, NP  metFORMIN (GLUCOPHAGE-XR) 500 MG 24 hr tablet Take 2 tablets by mouth with breakfast and 1 tablet by mouth after dinner. 10/18/22     Multiple Vitamin (MULTIVITAMIN PO) Take 1 capsule by mouth daily.    [provider]  nystatin-triamcinolone ointment (MYCOLOG) Apply  topically daily as needed. 12/22/21   Reva Bores, MD  Omega-3 Fatty Acids (FISH OIL) 1200 MG CAPS 2 cap twice daily Patient taking differently: Take 1 capsule by mouth daily at 6 (six) AM. 07/05/17   Opalski, Gavin Pound, DO  OVER THE COUNTER MEDICATION daily as needed. Patient not taking: Reported on 09/28/2022 01/14/21   [provider]  OZEMPIC, 0.25 OR 0.5 MG/DOSE, 2 MG/3ML SOPN Inject 0.5 mg into the skin once a week. 02/16/22     pioglitazone (ACTOS) 30 MG tablet Take 1 tablet by mouth daily. 10/18/22     predniSONE (DELTASONE) 20 MG tablet Take 2 tablets (40 mg total)  by mouth in the morning for 5 days. 11/02/22   Doreene Nest, NP  triamcinolone cream (KENALOG) 0.1 % Apply topically 2 times daily as needed. 11/02/22   Doreene Nest, NP  venlafaxine XR (EFFEXOR XR) 75 MG 24 hr capsule Take 1 capsule (75 mg total) by mouth daily with breakfast. for anxiety and depression. 11/02/22   Doreene Nest, NP    Family History Family History  Problem Relation Age of Onset   Hypertension Mother    Stroke Mother    Diabetes Father    Hyperthyroidism Father    Prostate cancer Father    Anxiety disorder Daughter    Colon cancer Neg Hx    Colon polyps Neg Hx    Esophageal cancer Neg Hx    Stomach cancer Neg Hx    Rectal cancer Neg Hx     Social History Social History   Tobacco Use   Smoking status: Never   Smokeless tobacco: Never  Vaping Use   Vaping Use: Never used  Substance Use Topics   Alcohol use: Not Currently    Alcohol/week: 0.0 - 1.0 standard drinks of alcohol    Comment: Monthly   Drug use: No     Allergies   Peanut oil and Codeine   Review of Systems Review of Systems See HPI  Physical Exam Triage Vital Signs ED Triage Vitals [11/03/22 1852]  Enc Vitals Group     BP 113/81     Pulse Rate 100     Resp 14     Temp 97.7 F (36.5 C)     Temp Source Oral     SpO2 (!) 1 %     Weight      Height      Head Circumference      Peak Flow       Pain Score      Pain Loc      Pain Edu?      Excl. in GC?    No data found.  Updated Vital Signs BP 122/88   Pulse 88   Temp 97.7 F (36.5 C) (Oral)   Resp 16   LMP 10/27/2020   SpO2 98%       Physical Exam Constitutional:      General: She is not in acute distress.    Appearance: She is well-developed. She is ill-appearing.     Comments: In obvious discomfort  HENT:     Head: Normocephalic and atraumatic.  Eyes:     Conjunctiva/sclera: Conjunctivae normal.     Pupils: Pupils are equal, round, and reactive to light.  Cardiovascular:     Rate and Rhythm: Normal rate and regular rhythm.     Heart sounds: Normal heart sounds.  Pulmonary:     Effort: Pulmonary effort is normal. No respiratory distress.     Breath sounds: Normal breath sounds.  Abdominal:     General: Bowel sounds are normal. There is no distension.     Palpations: Abdomen is soft.     Tenderness: There is generalized abdominal tenderness. There is right CVA tenderness.  Musculoskeletal:        General: Normal range of motion.     Cervical back: Normal range of motion.  Skin:    General: Skin is warm and dry.  Neurological:     Mental Status: She is alert.      UC Treatments / Results  Labs (all labs ordered are listed, but only abnormal results are  displayed) Labs Reviewed  POCT URINALYSIS DIP (MANUAL ENTRY) - Abnormal; Notable for the following components:      Result Value   Glucose, UA =100 (*)    Bilirubin, UA small (*)    Ketones, POC UA small (15) (*)    Spec Grav, UA >=1.030 (*)    Blood, UA large (*)    Protein Ur, POC =100 (*)    All other components within normal limits  URINE CULTURE    EKG   Radiology No results found.  Procedures Procedures (including critical care time)  Medications Ordered in UC Medications  ondansetron (ZOFRAN-ODT) disintegrating tablet 4 mg (4 mg Oral Given 11/03/22 1902)  ketorolac (TORADOL) 30 MG/ML injection 30 mg (30 mg Intramuscular Given  11/03/22 1914)    Initial Impression / Assessment and Plan / UC Course  I have reviewed the triage vital signs and the nursing notes.  Pertinent labs & imaging results that were available during my care of the patient were reviewed by me and considered in my medical decision making (see chart for details).     Patient has a kidney stones and her symptoms and urinalysis supports this.  I am concerned given her chills.  She does not have leukocytes on urinalysis.  I am waiting for her to give [place] a larger urine sample so we can send this for culture.  Will treat with  pain management and Flomax.  Medicine for nausea.  Call PCP in the morning if not improved.  Add antibiotics if culture positive Final Clinical Impressions(s) / UC Diagnoses   Final diagnoses:  Kidney stone  Gross hematuria  Flank pain     Discharge Instructions      May take Toradol for pain.  1 pill every 6 hours. Take Zofran as needed for nausea It is  important that you keep down lots of fluids Take the Flomax once a day to help Call your primary care doctor tomorrow. If you become acutely worse you may need to go to an emergency room      ED Prescriptions     Medication Sig Dispense Auth. Provider   ondansetron (ZOFRAN) 8 MG tablet Take 1 tablet (8 mg total) by mouth every 8 (eight) hours as needed for nausea or vomiting. 20 tablet Eustace Moore, MD   tamsulosin (FLOMAX) 0.4 MG CAPS capsule Take 1 capsule (0.4 mg total) by mouth daily after supper. 30 capsule Eustace Moore, MD   ketorolac (TORADOL) 10 MG tablet Take 1 tablet (10 mg total) by mouth every 6 (six) hours as needed. 20 tablet Eustace Moore, MD      I have reviewed the PDMP during this encounter.   Eustace Moore, MD 11/03/22 [phone]

## 2022-11-03 NOTE — Discharge Instructions (Signed)
May take Toradol for pain.  1 pill every 6 hours. Take Zofran as needed for nausea It is  important that you keep down lots of fluids Take the Flomax once a day to help Call your primary care doctor tomorrow. If you become acutely worse you may need to go to an emergency room

## 2022-11-03 NOTE — ED Triage Notes (Signed)
Pt presents with RUQ pain,nausea and fatigue that started this afternoon. Pt thinks she is passing a kidney stone.

## 2022-11-04 DIAGNOSIS — M5412 Radiculopathy, cervical region: Secondary | ICD-10-CM | POA: Diagnosis not present

## 2022-11-14 DIAGNOSIS — M5412 Radiculopathy, cervical region: Secondary | ICD-10-CM | POA: Diagnosis not present

## 2022-11-16 DIAGNOSIS — M5412 Radiculopathy, cervical region: Secondary | ICD-10-CM | POA: Diagnosis not present

## 2022-11-21 DIAGNOSIS — M5412 Radiculopathy, cervical region: Secondary | ICD-10-CM | POA: Diagnosis not present

## 2022-11-23 DIAGNOSIS — M5412 Radiculopathy, cervical region: Secondary | ICD-10-CM | POA: Diagnosis not present

## 2022-11-24 DIAGNOSIS — M5412 Radiculopathy, cervical region: Secondary | ICD-10-CM | POA: Diagnosis not present

## 2022-12-13 ENCOUNTER — Other Ambulatory Visit (HOSPITAL_COMMUNITY): Payer: Self-pay

## 2022-12-13 ENCOUNTER — Other Ambulatory Visit: Payer: Self-pay | Admitting: Primary Care

## 2022-12-13 ENCOUNTER — Ambulatory Visit (INDEPENDENT_AMBULATORY_CARE_PROVIDER_SITE_OTHER): Payer: 59

## 2022-12-13 DIAGNOSIS — Z23 Encounter for immunization: Secondary | ICD-10-CM | POA: Diagnosis not present

## 2022-12-13 DIAGNOSIS — E1159 Type 2 diabetes mellitus with other circulatory complications: Secondary | ICD-10-CM

## 2022-12-13 MED ORDER — LISINOPRIL 10 MG PO TABS
10.0000 mg | ORAL_TABLET | Freq: Every day | ORAL | 2 refills | Status: DC
  Filled 2022-12-13: qty 90, 90d supply, fill #0
  Filled 2023-03-20: qty 90, 90d supply, fill #1
  Filled 2023-06-19: qty 90, 90d supply, fill #2

## 2022-12-13 NOTE — Progress Notes (Signed)
Per orders of Mayra Reel, DPN AGNP-C, injection of Shingles  given by Benedict Needy  in right deltoid. Patient tolerated injection well. Vis given to patient and Hettie Holstein has been updated.

## 2022-12-14 ENCOUNTER — Other Ambulatory Visit (HOSPITAL_COMMUNITY): Payer: Self-pay

## 2022-12-14 ENCOUNTER — Other Ambulatory Visit: Payer: Self-pay

## 2022-12-14 MED ORDER — OZEMPIC (0.25 OR 0.5 MG/DOSE) 2 MG/3ML ~~LOC~~ SOPN
PEN_INJECTOR | SUBCUTANEOUS | 1 refills | Status: DC
  Filled 2022-12-14: qty 3, 28d supply, fill #0
  Filled 2023-01-06: qty 3, 28d supply, fill #1

## 2022-12-16 ENCOUNTER — Telehealth: Payer: Self-pay

## 2022-12-16 ENCOUNTER — Other Ambulatory Visit: Payer: 59

## 2022-12-16 DIAGNOSIS — N200 Calculus of kidney: Secondary | ICD-10-CM

## 2022-12-16 NOTE — Telephone Encounter (Signed)
Noted. Orders placed.

## 2022-12-16 NOTE — Telephone Encounter (Signed)
Pt passed a kidney stone recently and was advised to have it analyzed. Asking if Mayra Reel, NP would order that.

## 2022-12-24 LAB — STONE ANALYSIS: Stone Weight: 0.017 g

## 2022-12-29 ENCOUNTER — Ambulatory Visit
Admission: EM | Admit: 2022-12-29 | Discharge: 2022-12-29 | Disposition: A | Discharge status: Home / Self Care | Payer: 59 | Attending: Internal Medicine | Admitting: Internal Medicine

## 2022-12-29 ENCOUNTER — Ambulatory Visit (INDEPENDENT_AMBULATORY_CARE_PROVIDER_SITE_OTHER): Payer: 59

## 2022-12-29 DIAGNOSIS — S62621A Displaced fracture of medial phalanx of left index finger, initial encounter for closed fracture: Secondary | ICD-10-CM

## 2022-12-29 DIAGNOSIS — S62629A Displaced fracture of medial phalanx of unspecified finger, initial encounter for closed fracture: Secondary | ICD-10-CM | POA: Diagnosis not present

## 2022-12-29 MED ORDER — IBUPROFEN 600 MG PO TABS: 600.0000 mg | ORAL_TABLET | Freq: Four times a day (QID) | ORAL | 0 refills | Status: DC | PRN

## 2022-12-29 NOTE — Discharge Instructions (Signed)
Apply the buddy tape system is much as possible.  Use ibuprofen for pain relief.  Follow-up with [facility] or Dr. Andi Hence office.

## 2022-12-29 NOTE — ED Provider Notes (Signed)
[facility]  Note:  This document was prepared using Conservation officer, historic buildings and may include unintentional dictation errors.  MRN: [id number] DOB: Jan 11, 1971  Subjective:   Pheonix Marquez is a 52 y.o. female presenting for suffering a left fourth finger injury today.  Patient was horse playing with her son and ended up inadvertently hurting it when she smacked it against his head. Heard a pop.  Has since had swelling, bruising and pain.  No current facility-administered medications for this encounter.  Current Outpatient Medications:    atorvastatin (LIPITOR) 40 MG tablet, Take 1 tablet (40 mg total) by mouth daily. for cholesterol., Disp: 90 tablet, Rfl: 3   baclofen (LIORESAL) 10 MG tablet, Take 1 tablet (10 mg total) by mouth 3 (three) times daily., Disp: 30 each, Rfl: 0   blood glucose meter kit and supplies KIT, Dispense based on patient and insurance preference. Use up to four times daily as directed. (FOR ICD-9 250.00, 250.01)., Disp: 1 each, Rfl: 0   Blood Glucose Monitoring Suppl (FREESTYLE LITE) DEVI, , Disp: , Rfl:    Cholecalciferol (VITAMIN D3) 5000 units CAPS, Take 1 capsule by mouth daily., Disp: , Rfl:    cyanocobalamin 1000 MCG tablet, Take 1,000 mcg by mouth daily., Disp: , Rfl:    cyclobenzaprine (FLEXERIL) 10 MG tablet, Take 10 mg by mouth as needed for muscle spasms., Disp: , Rfl:    fexofenadine (ALLEGRA) 60 MG tablet, Take 1 tablet by mouth daily., Disp: , Rfl:    fluticasone (FLONASE) 50 MCG/ACT nasal spray, Place 2 sprays into both nostrils daily. (Patient not taking: Reported on 11/02/2022), Disp: 16 g, Rfl: 6   glucose blood (FREESTYLE LITE) test strip, Use as instructed to test blood sugar up to 4 times daily, Disp: 300 each, Rfl: 2   ketorolac (TORADOL) 10 MG tablet, Take 1 tablet (10 mg total) by mouth every 6 (six) hours as needed., Disp: 20 tablet, Rfl: 0   Lancets (FREESTYLE) lancets, , Disp: , Rfl:    lisinopril (ZESTRIL) 10 MG  tablet, Take 1 tablet by mouth daily for blood pressure., Disp: 90 tablet, Rfl: 2   metFORMIN (GLUCOPHAGE-XR) 500 MG 24 hr tablet, Take 2 tablets by mouth with breakfast and 1 tablet by mouth after dinner., Disp: 270 tablet, Rfl: 3   Multiple Vitamin (MULTIVITAMIN PO), Take 1 capsule by mouth daily., Disp: , Rfl:    nystatin-triamcinolone ointment (MYCOLOG), Apply topically daily as needed., Disp: 60 g, Rfl: 2   Omega-3 Fatty Acids (FISH OIL) 1200 MG CAPS, 2 cap twice daily (Patient taking differently: Take 1 capsule by mouth daily at 6 (six) AM.), Disp: , Rfl:    ondansetron (ZOFRAN) 8 MG tablet, Take 1 tablet (8 mg total) by mouth every 8 (eight) hours as needed for nausea or vomiting., Disp: 20 tablet, Rfl: 0   OVER THE COUNTER MEDICATION, daily as needed. (Patient not taking: Reported on 09/28/2022), Disp: , Rfl:    OZEMPIC, 0.25 OR 0.5 MG/DOSE, 2 MG/3ML SOPN, Inject 0.5 mg into the skin once a week. Keep upcoming appt for continued refills., Disp: 3 mL, Rfl: 1   pioglitazone (ACTOS) 30 MG tablet, Take 1 tablet by mouth daily., Disp: 90 tablet, Rfl: 3   predniSONE (DELTASONE) 20 MG tablet, Take 2 tablets (40 mg total) by mouth in the morning for 5 days., Disp: 10 tablet, Rfl: 0   tamsulosin (FLOMAX) 0.4 MG CAPS capsule, Take 1 capsule (0.4 mg total) by mouth daily after  supper., Disp: 30 capsule, Rfl: 1   triamcinolone cream (KENALOG) 0.1 %, Apply topically 2 times daily as needed., Disp: 30 g, Rfl: 0   venlafaxine XR (EFFEXOR XR) 75 MG 24 hr capsule, Take 1 capsule (75 mg total) by mouth daily with breakfast. for anxiety and depression., Disp: 90 capsule, Rfl: 0   Allergies  Allergen Reactions   Peanut Oil Anaphylaxis    Slurred speech Slurred speech   Codeine Nausea Only and Nausea And Vomiting    Past Medical History:  Diagnosis Date   Anxiety    Arthritis    Chronic neck pain 07/14/2020   Depression    PMH   Diabetes mellitus without complication (HCC)    type 2-on meds   Dog  bite    left hand   History of kidney stones    Hyperlipidemia    Hypertension    Polycystic ovarian syndrome    Seasonal allergies      Past Surgical History:  Procedure Laterality Date   CERVICAL FUSION  07/2020   CYSTOSCOPY/URETEROSCOPY/HOLMIUM LASER/STENT PLACEMENT Left 03/22/2019   Procedure: CYSTOSCOPY/URETEROSCOPY/STENT PLACEMENT/ BASKET STONE EXTRACTION/ LEFT RETROGRADE PYELOGRAM;  Surgeon: Rene Paci, MD;  Location: [facility];  Service: Urology;  Laterality: Left;   HYMENECTOMY N/A 12/05/2018   Procedure: Vaginoplasty;  Surgeon: Reva Bores, MD;  Location: [facility];  Service: Gynecology;  Laterality: N/A;  Please have four 2-0 Vicryl on a CT-1 and of 0.25% Marcaine   HYSTERECTOMY ABDOMINAL WITH SALPINGECTOMY Bilateral 11/2020   I & D EXTREMITY Left 08/10/2019   Procedure: IRRIGATION AND DEBRIDEMENT LEFT SMALL FINGER AND LEFT HAND WOUNDS, PERCUTANEOUS PINNING LEFT SMALL FINGER;  Surgeon: Ernest Mallick, MD;  Location: [facility];  Service: Orthopedics;  Laterality: Left;   VAGINA SURGERY  2010   extra skin removed   WISDOM TOOTH EXTRACTION      Family History  Problem Relation Age of Onset   Hypertension Mother    Stroke Mother    Diabetes Father    Hyperthyroidism Father    Prostate cancer Father    Anxiety disorder Daughter    Colon cancer Neg Hx    Colon polyps Neg Hx    Esophageal cancer Neg Hx    Stomach cancer Neg Hx    Rectal cancer Neg Hx     Social History   Tobacco Use   Smoking status: Never   Smokeless tobacco: Never  Vaping Use   Vaping status: Never Used  Substance Use Topics   Alcohol use: Not Currently    Alcohol/week: 0.0 - 1.0 standard drinks of alcohol    Comment: Monthly   Drug use: No    ROS   Objective:   Vitals: BP 112/75 (BP Location: Right Arm)   Pulse 87   Temp 98 F (36.7 C) (Oral)   Resp 16   LMP 10/27/2020   SpO2 98%   Physical Exam Constitutional:       General: She is not in acute distress.    Appearance: Normal appearance. She is well-developed. She is not ill-appearing, toxic-appearing or diaphoretic.  HENT:     Head: Normocephalic and atraumatic.     Nose: Nose normal.     Mouth/Throat:     Mouth: Mucous membranes are moist.  Eyes:     General: No scleral icterus.       Right eye: No discharge.        Left eye: No discharge.  Extraocular Movements: Extraocular movements intact.  Cardiovascular:     Rate and Rhythm: Normal rate.  Pulmonary:     Effort: Pulmonary effort is normal.  Skin:    General: Skin is warm and dry.  Neurological:     General: No focal deficit present.     Mental Status: She is alert and oriented to person, place, and time.  Psychiatric:        Mood and Affect: Mood normal.        Behavior: Behavior normal.    DG Finger Ring Left  Result Date: 12/29/2022 CLINICAL DATA:  Left fourth finger injury.  Swelling and bruising. EXAM: LEFT RING FINGER 2+V COMPARISON:  None Available. FINDINGS: Small acute minimally displaced volar plate fracture from the middle phalanx at the proximal interphalangeal joint. This is only seen on the lateral view. No additional fracture. No dislocation, joint spaces are preserved. Soft tissue edema proximally. IMPRESSION: Small acute minimally displaced volar plate fracture from the middle phalanx at the proximal interphalangeal joint. Electronically Signed   By: Narda Rutherford M.D.   On: 12/29/2022 19:26     Assessment and Plan :   PDMP not reviewed this encounter.  1. Displaced fracture of middle phalanx of left index finger, initial encounter for closed fracture    Applied buddy tape system to the left third and fourth fingers.  Recommended follow-up with her orthopedist at [facility].  Counseled patient on potential for adverse effects with medications prescribed/recommended today, ER and return-to-clinic precautions discussed, patient verbalized understanding.     Wallis Bamberg, [place] 01/02/23 [phone]

## 2022-12-29 NOTE — ED Triage Notes (Signed)
Pt states left ring finger injured today while horse playing with her son. Left ring finger swollen and bruised.

## 2022-12-30 DIAGNOSIS — S62621A Displaced fracture of medial phalanx of left index finger, initial encounter for closed fracture: Secondary | ICD-10-CM | POA: Diagnosis not present

## 2023-01-05 DIAGNOSIS — M25642 Stiffness of left hand, not elsewhere classified: Secondary | ICD-10-CM | POA: Diagnosis not present

## 2023-01-19 ENCOUNTER — Other Ambulatory Visit (HOSPITAL_COMMUNITY): Payer: Self-pay

## 2023-01-19 DIAGNOSIS — M25642 Stiffness of left hand, not elsewhere classified: Secondary | ICD-10-CM | POA: Diagnosis not present

## 2023-01-24 ENCOUNTER — Other Ambulatory Visit: Payer: Self-pay | Admitting: Primary Care

## 2023-01-24 ENCOUNTER — Other Ambulatory Visit (HOSPITAL_COMMUNITY): Payer: Self-pay

## 2023-01-24 DIAGNOSIS — F411 Generalized anxiety disorder: Secondary | ICD-10-CM

## 2023-01-24 DIAGNOSIS — F339 Major depressive disorder, recurrent, unspecified: Secondary | ICD-10-CM

## 2023-01-24 MED ORDER — VENLAFAXINE HCL ER 75 MG PO CP24
75.0000 mg | ORAL_CAPSULE | Freq: Every day | ORAL | 1 refills | Status: DC
Start: 2023-01-24 — End: 2023-08-04
  Filled 2023-01-24: qty 90, 90d supply, fill #0
  Filled 2023-04-30: qty 90, 90d supply, fill #1

## 2023-01-31 DIAGNOSIS — S62621A Displaced fracture of medial phalanx of left index finger, initial encounter for closed fracture: Secondary | ICD-10-CM | POA: Diagnosis not present

## 2023-02-06 ENCOUNTER — Other Ambulatory Visit (HOSPITAL_COMMUNITY): Payer: Self-pay

## 2023-02-08 ENCOUNTER — Other Ambulatory Visit (HOSPITAL_COMMUNITY): Payer: Self-pay

## 2023-02-08 MED ORDER — OZEMPIC (0.25 OR 0.5 MG/DOSE) 2 MG/3ML ~~LOC~~ SOPN
PEN_INJECTOR | SUBCUTANEOUS | 0 refills | Status: DC
  Filled 2023-02-08: qty 3, 28d supply, fill #0

## 2023-02-21 ENCOUNTER — Other Ambulatory Visit (HOSPITAL_COMMUNITY): Payer: Self-pay

## 2023-02-27 DIAGNOSIS — E1165 Type 2 diabetes mellitus with hyperglycemia: Secondary | ICD-10-CM | POA: Diagnosis not present

## 2023-02-27 DIAGNOSIS — Z7985 Long-term (current) use of injectable non-insulin antidiabetic drugs: Secondary | ICD-10-CM | POA: Diagnosis not present

## 2023-02-27 DIAGNOSIS — Z7984 Long term (current) use of oral hypoglycemic drugs: Secondary | ICD-10-CM | POA: Diagnosis not present

## 2023-03-03 ENCOUNTER — Other Ambulatory Visit (HOSPITAL_COMMUNITY): Payer: Self-pay

## 2023-03-03 DIAGNOSIS — M50222 Other cervical disc displacement at C5-C6 level: Secondary | ICD-10-CM | POA: Diagnosis not present

## 2023-03-03 DIAGNOSIS — M50121 Cervical disc disorder at C4-C5 level with radiculopathy: Secondary | ICD-10-CM | POA: Diagnosis not present

## 2023-03-03 DIAGNOSIS — G5601 Carpal tunnel syndrome, right upper limb: Secondary | ICD-10-CM | POA: Diagnosis not present

## 2023-03-03 DIAGNOSIS — Z981 Arthrodesis status: Secondary | ICD-10-CM | POA: Diagnosis not present

## 2023-03-03 MED ORDER — CYCLOBENZAPRINE HCL 10 MG PO TABS
10.0000 mg | ORAL_TABLET | Freq: Three times a day (TID) | ORAL | 0 refills | Status: DC | PRN
  Filled 2023-03-03: qty 40, 14d supply, fill #0

## 2023-03-03 MED ORDER — OXYCODONE-ACETAMINOPHEN 5-325 MG PO TABS
1.0000 | ORAL_TABLET | Freq: Four times a day (QID) | ORAL | 0 refills | Status: DC | PRN
Start: 2023-03-03 — End: 2023-03-16
  Filled 2023-03-03: qty 30, 8d supply, fill #0

## 2023-03-20 ENCOUNTER — Other Ambulatory Visit (HOSPITAL_COMMUNITY): Payer: Self-pay

## 2023-03-21 ENCOUNTER — Other Ambulatory Visit (HOSPITAL_COMMUNITY): Payer: Self-pay

## 2023-03-21 MED ORDER — OZEMPIC (0.25 OR 0.5 MG/DOSE) 2 MG/3ML ~~LOC~~ SOPN
PEN_INJECTOR | SUBCUTANEOUS | 0 refills | Status: DC
  Filled 2023-03-21: qty 3, 28d supply, fill #0

## 2023-03-28 DIAGNOSIS — G5602 Carpal tunnel syndrome, left upper limb: Secondary | ICD-10-CM | POA: Diagnosis not present

## 2023-04-11 ENCOUNTER — Other Ambulatory Visit (HOSPITAL_COMMUNITY): Payer: Self-pay

## 2023-04-11 DIAGNOSIS — M5412 Radiculopathy, cervical region: Secondary | ICD-10-CM | POA: Diagnosis not present

## 2023-04-11 MED ORDER — CYCLOBENZAPRINE HCL 10 MG PO TABS
10.0000 mg | ORAL_TABLET | Freq: Three times a day (TID) | ORAL | 0 refills | Status: AC | PRN
  Filled 2023-04-11: qty 40, 14d supply, fill #0

## 2023-04-13 ENCOUNTER — Other Ambulatory Visit (HOSPITAL_COMMUNITY): Payer: Self-pay

## 2023-04-13 MED ORDER — OZEMPIC (0.25 OR 0.5 MG/DOSE) 2 MG/3ML ~~LOC~~ SOPN
0.5000 mg | PEN_INJECTOR | SUBCUTANEOUS | 3 refills | Status: DC
  Filled 2023-04-13: qty 3, 28d supply, fill #0
  Filled 2023-05-08: qty 3, 28d supply, fill #1
  Filled 2023-06-23: qty 3, 28d supply, fill #2
  Filled 2023-07-23: qty 3, 28d supply, fill #3

## 2023-04-14 ENCOUNTER — Other Ambulatory Visit (HOSPITAL_COMMUNITY): Payer: Self-pay

## 2023-04-24 ENCOUNTER — Other Ambulatory Visit: Payer: Self-pay

## 2023-04-25 ENCOUNTER — Encounter: Payer: Self-pay | Admitting: Primary Care

## 2023-04-25 NOTE — Telephone Encounter (Signed)
 Care team updated and letter sent for eye exam notes.

## 2023-05-08 ENCOUNTER — Other Ambulatory Visit (HOSPITAL_COMMUNITY): Payer: Self-pay

## 2023-05-08 DIAGNOSIS — E119 Type 2 diabetes mellitus without complications: Secondary | ICD-10-CM | POA: Diagnosis not present

## 2023-05-08 DIAGNOSIS — G5602 Carpal tunnel syndrome, left upper limb: Secondary | ICD-10-CM | POA: Diagnosis not present

## 2023-05-08 MED ORDER — HYDROCODONE-ACETAMINOPHEN 5-325 MG PO TABS
1.0000 | ORAL_TABLET | Freq: Four times a day (QID) | ORAL | 0 refills | Status: DC
  Filled 2023-05-08: qty 20, 5d supply, fill #0

## 2023-05-09 ENCOUNTER — Other Ambulatory Visit (HOSPITAL_COMMUNITY): Payer: Self-pay

## 2023-05-23 ENCOUNTER — Other Ambulatory Visit (HOSPITAL_COMMUNITY): Payer: Self-pay

## 2023-05-23 DIAGNOSIS — G5600 Carpal tunnel syndrome, unspecified upper limb: Secondary | ICD-10-CM | POA: Diagnosis not present

## 2023-05-23 DIAGNOSIS — M5412 Radiculopathy, cervical region: Secondary | ICD-10-CM | POA: Diagnosis not present

## 2023-05-23 MED ORDER — DOXYCYCLINE MONOHYDRATE 100 MG PO CAPS
100.0000 mg | ORAL_CAPSULE | Freq: Two times a day (BID) | ORAL | 0 refills | Status: DC
  Filled 2023-05-23: qty 20, 10d supply, fill #0

## 2023-05-23 MED ORDER — TRAMADOL HCL 50 MG PO TABS
50.0000 mg | ORAL_TABLET | Freq: Four times a day (QID) | ORAL | 0 refills | Status: DC | PRN
  Filled 2023-05-23: qty 40, 10d supply, fill #0

## 2023-05-26 ENCOUNTER — Other Ambulatory Visit (HOSPITAL_COMMUNITY): Payer: Self-pay

## 2023-05-27 ENCOUNTER — Other Ambulatory Visit (HOSPITAL_COMMUNITY): Payer: Self-pay

## 2023-05-27 MED ORDER — CEPHALEXIN 500 MG PO CAPS
500.0000 mg | ORAL_CAPSULE | Freq: Four times a day (QID) | ORAL | 0 refills | Status: DC
  Filled 2023-05-27: qty 40, 10d supply, fill #0

## 2023-06-14 ENCOUNTER — Other Ambulatory Visit (HOSPITAL_COMMUNITY): Payer: Self-pay

## 2023-06-20 DIAGNOSIS — Z6834 Body mass index (BMI) 34.0-34.9, adult: Secondary | ICD-10-CM | POA: Diagnosis not present

## 2023-06-20 DIAGNOSIS — G5600 Carpal tunnel syndrome, unspecified upper limb: Secondary | ICD-10-CM | POA: Diagnosis not present

## 2023-06-20 DIAGNOSIS — M542 Cervicalgia: Secondary | ICD-10-CM | POA: Diagnosis not present

## 2023-06-23 ENCOUNTER — Other Ambulatory Visit (HOSPITAL_COMMUNITY): Payer: Self-pay

## 2023-08-04 ENCOUNTER — Other Ambulatory Visit: Payer: Self-pay | Admitting: Primary Care

## 2023-08-04 DIAGNOSIS — F339 Major depressive disorder, recurrent, unspecified: Secondary | ICD-10-CM

## 2023-08-04 DIAGNOSIS — F411 Generalized anxiety disorder: Secondary | ICD-10-CM

## 2023-08-05 MED ORDER — VENLAFAXINE HCL ER 75 MG PO CP24
75.0000 mg | ORAL_CAPSULE | Freq: Every day | ORAL | 0 refills | Status: DC
  Filled 2023-08-05: qty 90, 90d supply, fill #0

## 2023-08-05 NOTE — Telephone Encounter (Signed)
Patient is due for CPE/follow up in late May, this will be required prior to any further refills.  Please schedule, thank you!   

## 2023-08-07 ENCOUNTER — Other Ambulatory Visit (HOSPITAL_COMMUNITY): Payer: Self-pay

## 2023-08-07 NOTE — Telephone Encounter (Signed)
 Patient has been scheduled

## 2023-08-20 ENCOUNTER — Other Ambulatory Visit (HOSPITAL_COMMUNITY): Payer: Self-pay

## 2023-08-22 ENCOUNTER — Other Ambulatory Visit (HOSPITAL_COMMUNITY): Payer: Self-pay

## 2023-08-22 MED ORDER — OZEMPIC (0.25 OR 0.5 MG/DOSE) 2 MG/3ML ~~LOC~~ SOPN
0.5000 mg | PEN_INJECTOR | SUBCUTANEOUS | 3 refills | Status: DC
  Filled 2023-08-22: qty 3, 28d supply, fill #0
  Filled 2023-09-19: qty 3, 28d supply, fill #1
  Filled 2023-10-20: qty 3, 28d supply, fill #2
  Filled 2023-11-11: qty 3, 28d supply, fill #3

## 2023-09-13 ENCOUNTER — Ambulatory Visit
Admission: EM | Admit: 2023-09-13 | Discharge: 2023-09-13 | Disposition: A | Discharge status: Home / Self Care | Attending: Family Medicine | Admitting: Family Medicine

## 2023-09-13 ENCOUNTER — Other Ambulatory Visit (HOSPITAL_COMMUNITY): Payer: Self-pay

## 2023-09-13 DIAGNOSIS — J069 Acute upper respiratory infection, unspecified: Secondary | ICD-10-CM

## 2023-09-13 DIAGNOSIS — R059 Cough, unspecified: Secondary | ICD-10-CM

## 2023-09-13 MED ORDER — PREDNISONE 20 MG PO TABS
ORAL_TABLET | ORAL | 0 refills | Status: DC
  Filled 2023-09-13: qty 15, 5d supply, fill #0

## 2023-09-13 MED ORDER — AZITHROMYCIN 250 MG PO TABS
250.0000 mg | ORAL_TABLET | Freq: Every day | ORAL | 0 refills | Status: DC
  Filled 2023-09-13: qty 6, 6d supply, fill #0

## 2023-09-13 NOTE — Discharge Instructions (Addendum)
 Advised patient to take medications as directed with food to completion.  Advised patient to take prednisone  with Zithromax  daily for the next 5 days.  Encouraged to increase daily water intake to 64 ounces per day while taking these medications.  Advised if symptoms worsen and/or unresolved please follow-up with your PCP or here for further evaluation.

## 2023-09-13 NOTE — ED Triage Notes (Addendum)
 Pt c/o nasal congestion and chills since Sat night. No known fever. Taking robitussin nighttime and tylenol  prn.

## 2023-09-13 NOTE — ED Provider Notes (Signed)
 [facility]    CSN: [id number] Arrival date & time: 09/13/23  1110      History   Chief Complaint Chief Complaint  Patient presents with   Nasal Congestion   Chills    HPI Erin Marquez is a 53 y.o. female.   HPI pleasant 53 year old female presents with sinus nasal congestion and chills for 4 days.  PMH significant for obesity, chronic arthralgias, HTN, and T2DM with hyperglycemia  Past Medical History:  Diagnosis Date   Anxiety    Arthritis    Chronic neck pain 07/14/2020   Depression    PMH   Diabetes mellitus without complication (HCC)    type 2-on meds   Dog bite    left hand   History of kidney stones    Hyperlipidemia    Hypertension    Polycystic ovarian syndrome    Seasonal allergies     Patient Active Problem List   Diagnosis Date Noted   Paresthesias 09/28/2022   Vasomotor symptoms due to menopause 12/22/2021   GAD (generalized anxiety disorder) 09/08/2021   Chronic fatigue 09/08/2021   Decreased renal function 07/10/2019   Encounter for annual general medical examination with abnormal findings in adult 07/10/2019   Rash and nonspecific skin eruption 12/27/2018   Abnormal TSH 07/04/2018   Seborrheic keratosis 09/06/2017   PCOS (polycystic ovarian syndrome) 06/13/2017   Type 2 diabetes mellitus with hyperglycemia (HCC) 06/13/2017   Hypertension associated with diabetes (HCC) 06/13/2017   Mixed diabetic hyperlipidemia associated with type 2 diabetes mellitus (HCC) 06/13/2017   Vitamin D  deficiency 06/13/2017   Environmental and seasonal allergies 06/13/2017   Chronic Arthralgias- bilateral hands and feet 06/13/2017   Family history of rheumatoid arthritis 06/13/2017   SK (solar keratosis)- two on face 06/13/2017   Elevated liver enzymes- ALT and AST since  06/13/2017   Depression, recurrent (HCC) 06/16/2014   Sciatica 05/13/2014    Past Surgical History:  Procedure Laterality Date   CERVICAL FUSION  07/2020    CYSTOSCOPY/URETEROSCOPY/HOLMIUM LASER/STENT PLACEMENT Left 03/22/2019   Procedure: CYSTOSCOPY/URETEROSCOPY/STENT PLACEMENT/ BASKET STONE EXTRACTION/ LEFT RETROGRADE PYELOGRAM;  Surgeon: Adelbert Homans, MD;  Location: [facility];  Service: Urology;  Laterality: Left;   HYMENECTOMY N/A 12/05/2018   Procedure: Vaginoplasty;  Surgeon: Granville Layer, MD;  Location: [facility];  Service: Gynecology;  Laterality: N/A;  Please have four 2-0 Vicryl on a CT-1 and of 0.25% Marcaine    HYSTERECTOMY ABDOMINAL WITH SALPINGECTOMY Bilateral 11/2020   I & D EXTREMITY Left 08/10/2019   Procedure: IRRIGATION AND DEBRIDEMENT LEFT SMALL FINGER AND LEFT HAND WOUNDS, PERCUTANEOUS PINNING LEFT SMALL FINGER;  Surgeon: Oralia Bills, MD;  Location: [facility];  Service: Orthopedics;  Laterality: Left;   VAGINA SURGERY  2010   extra skin removed   WISDOM TOOTH EXTRACTION      OB History     Gravida  2   Para  2   Term  2   Preterm      AB      Living  2      SAB      IAB      Ectopic      Multiple      Live Births               Home Medications    Prior to Admission medications   Medication Sig Start Date End Date Taking? Authorizing Provider  azithromycin (ZITHROMAX) 250 MG tablet Take first  2 tablets togetheron day 1, then 1 tablet every day until finished. 09/13/23  Yes Leonides Ramp, FNP  predniSONE  (DELTASONE ) 20 MG tablet Take 3 tablets by mouth daily for 5 days 09/13/23  Yes Leonides Ramp, FNP  atorvastatin  (LIPITOR) 40 MG tablet Take 1 tablet (40 mg total) by mouth daily. for cholesterol. 10/11/22   Clark, Katherine K, NP  baclofen  (LIORESAL ) 10 MG tablet Take 1 tablet (10 mg total) by mouth 3 (three) times daily. 06/08/22   Crain, Whitney L, PA  blood glucose meter kit and supplies KIT Dispense based on patient and insurance preference. Use up to four times daily as directed. (FOR ICD-9 250.00, 250.01). 07/04/18   Gabriel John, NP   Blood Glucose Monitoring Suppl (FREESTYLE LITE) DEVI  07/04/18   [provider]  Cholecalciferol (VITAMIN D3) 5000 units CAPS Take 1 capsule by mouth daily.    [provider]  cyanocobalamin  1000 MCG tablet Take 1,000 mcg by mouth daily.    [provider]  cyclobenzaprine  (FLEXERIL ) 10 MG tablet Take 10 mg by mouth as needed for muscle spasms.    [provider]  cyclobenzaprine  (FLEXERIL ) 10 MG tablet take 1 tablet by mouth every 8 hours as needed 03/03/23     cyclobenzaprine  (FLEXERIL ) 10 MG tablet take 1 tablet by mouth every 8 hours as needed 04/11/23     fexofenadine (ALLEGRA) 60 MG tablet Take 1 tablet by mouth daily.    [provider]  fluticasone  (FLONASE ) 50 MCG/ACT nasal spray Place 2 sprays into both nostrils daily. Patient not taking: Reported on 11/02/2022 08/20/22   Delfina Feller, FNP  glucose blood (FREESTYLE LITE) test strip Use as instructed to test blood sugar up to 4 times daily 07/25/18   Clark, Katherine K, NP  ibuprofen  (ADVIL ) 600 MG tablet Take 1 tablet (600 mg total) by mouth every 6 (six) hours as needed. 12/29/22   Adolph Hoop, PA-C  ketorolac  (TORADOL ) 10 MG tablet Take 1 tablet (10 mg total) by mouth every 6 (six) hours as needed. 11/03/22   Stephany Ehrich, MD  Lancets (FREESTYLE) lancets  07/04/18   [provider]  lisinopril  (ZESTRIL ) 10 MG tablet Take 1 tablet by mouth daily for blood pressure. 12/13/22   Clark, Katherine K, NP  metFORMIN  (GLUCOPHAGE -XR) 500 MG 24 hr tablet Take 2 tablets by mouth with breakfast and 1 tablet by mouth after dinner. 10/18/22     Multiple Vitamin (MULTIVITAMIN PO) Take 1 capsule by mouth daily.    [provider]  nystatin -triamcinolone  ointment (MYCOLOG) Apply topically daily as needed. 12/22/21   Granville Layer, MD  Omega-3 Fatty Acids (FISH OIL ) 1200 MG CAPS 2 cap twice daily Patient taking differently: Take 1 capsule by mouth daily at 6 (six) AM. 07/05/17    Opalski, Bernardo Bridgeman, DO  ondansetron  (ZOFRAN ) 8 MG tablet Take 1 tablet (8 mg total) by mouth every 8 (eight) hours as needed for nausea or vomiting. 11/03/22   Stephany Ehrich, MD  OVER THE COUNTER MEDICATION daily as needed. Patient not taking: Reported on 09/28/2022 01/14/21   [provider]  OZEMPIC , 0.25 OR 0.5 MG/DOSE, 2 MG/3ML SOPN Inject 0.5 mg into the skin once a week. 08/22/23     pioglitazone  (ACTOS ) 30 MG tablet Take 1 tablet by mouth daily. 10/18/22     tamsulosin  (FLOMAX ) 0.4 MG CAPS capsule Take 1 capsule (0.4 mg total) by mouth daily after supper. 11/03/22   Stephany Ehrich, MD  triamcinolone   cream (KENALOG ) 0.1 % Apply topically 2 times daily as needed. 11/02/22   Clark, Katherine K, NP  venlafaxine  XR (EFFEXOR  XR) 75 MG 24 hr capsule Take 1 capsule (75 mg total) by mouth daily with breakfast. for anxiety and depression. 08/05/23   Gabriel John, NP    Family History Family History  Problem Relation Age of Onset   Hypertension Mother    Stroke Mother    Diabetes Father    Hyperthyroidism Father    Prostate cancer Father    Anxiety disorder Daughter    Colon cancer Neg Hx    Colon polyps Neg Hx    Esophageal cancer Neg Hx    Stomach cancer Neg Hx    Rectal cancer Neg Hx     Social History Social History   Tobacco Use   Smoking status: Never   Smokeless tobacco: Never  Vaping Use   Vaping status: Never Used  Substance Use Topics   Alcohol use: Not Currently    Alcohol/week: 0.0 - 1.0 standard drinks of alcohol    Comment: Monthly   Drug use: No     Allergies   Peanut oil, Codeine, and Doxycycline  hyclate   Review of Systems Review of Systems  HENT:  Positive for congestion and sore throat.   Respiratory:  Positive for cough.   All other systems reviewed and are negative.    Physical Exam Triage Vital Signs ED Triage Vitals  Encounter Vitals Group     BP 09/13/23 1131 130/84     Systolic BP Percentile --      Diastolic BP Percentile  --      Pulse Rate 09/13/23 1131 98     Resp 09/13/23 1131 17     Temp 09/13/23 1131 99 F (37.2 C)     Temp Source 09/13/23 1131 Oral     SpO2 09/13/23 1131 98 %     Weight --      Height --      Head Circumference --      Peak Flow --      Pain Score 09/13/23 1130 0     Pain Loc --      Pain Education --      Exclude from Growth Chart --    No data found.  Updated Vital Signs BP 130/84 (BP Location: Right Arm)   Pulse 98   Temp 99 F (37.2 C) (Oral)   Resp 17   LMP 10/27/2020   SpO2 98%    Physical Exam Vitals and nursing note reviewed.  Constitutional:      Appearance: Normal appearance. She is obese. She is ill-appearing.  HENT:     Head: Normocephalic and atraumatic.     Right Ear: Tympanic membrane, ear canal and external ear normal.     Left Ear: Tympanic membrane, ear canal and external ear normal.     Mouth/Throat:     Mouth: Mucous membranes are moist.     Pharynx: Oropharynx is clear.  Eyes:     Extraocular Movements: Extraocular movements intact.     Conjunctiva/sclera: Conjunctivae normal.     Pupils: Pupils are equal, round, and reactive to light.  Cardiovascular:     Rate and Rhythm: Normal rate and regular rhythm.     Pulses: Normal pulses.     Heart sounds: Normal heart sounds.  Pulmonary:     Effort: Pulmonary effort is normal.     Breath sounds: Normal breath sounds. No wheezing, rhonchi or  rales.     Comments: Frequent nonproductive cough noted on exam Musculoskeletal:        General: Normal range of motion.     Cervical back: Normal range of motion and neck supple.  Skin:    General: Skin is warm and dry.  Neurological:     General: No focal deficit present.     Mental Status: She is alert and oriented to person, place, and time. Mental status is at baseline.  Psychiatric:        Mood and Affect: Mood normal.        Behavior: Behavior normal.      UC Treatments / Results  Labs (all labs ordered are listed, but only abnormal  results are displayed) Labs Reviewed - No data to display  EKG   Radiology No results found.  Procedures Procedures (including critical care time)  Medications Ordered in UC Medications - No data to display  Initial Impression / Assessment and Plan / UC Course  I have reviewed the triage vital signs and the nursing notes.  Pertinent labs & imaging results that were available during my care of the patient were reviewed by me and considered in my medical decision making (see chart for details).     MDM: 1.  Acute URI-Rx'd Zithromax (500 mg day 1, then 250 mg day 2-5); 2.  Cough, unspecified type-Rx'd prednisone  20 mg tablet: Take 3 tabs p.o. daily x 5 days. Advised patient to take medications as directed with food to completion.  Advised patient to take prednisone  with Zithromax daily for the next 5 days.  Encouraged to increase daily water intake to 64 ounces per day while taking these medications.  Advised if symptoms worsen and/or unresolved please follow-up with your PCP or here for further evaluation.  Patient discharged home, hemodynamically stable. Final Clinical Impressions(s) / UC Diagnoses   Final diagnoses:  Acute URI  Cough, unspecified type     Discharge Instructions      Advised patient to take medications as directed with food to completion.  Advised patient to take prednisone  with Zithromax daily for the next 5 days.  Encouraged to increase daily water intake to 64 ounces per day while taking these medications.  Advised if symptoms worsen and/or unresolved please follow-up with your PCP or here for further evaluation.     ED Prescriptions     Medication Sig Dispense Auth. Provider   azithromycin (ZITHROMAX) 250 MG tablet Take first 2 tablets togetheron day 1, then 1 tablet every day until finished. 6 tablet Stancil Deisher, FNP   predniSONE  (DELTASONE ) 20 MG tablet Take 3 tablets by mouth daily for 5 days 15 tablet Bethlehem Langstaff, FNP      PDMP not reviewed  this encounter.   Leonides Ramp, FNP 09/13/23 1259

## 2023-09-29 ENCOUNTER — Other Ambulatory Visit (HOSPITAL_COMMUNITY): Payer: Self-pay

## 2023-09-29 ENCOUNTER — Ambulatory Visit (INDEPENDENT_AMBULATORY_CARE_PROVIDER_SITE_OTHER): Admitting: Primary Care

## 2023-09-29 ENCOUNTER — Other Ambulatory Visit (INDEPENDENT_AMBULATORY_CARE_PROVIDER_SITE_OTHER)

## 2023-09-29 ENCOUNTER — Other Ambulatory Visit: Payer: Self-pay | Admitting: Primary Care

## 2023-09-29 ENCOUNTER — Ambulatory Visit: Payer: Self-pay | Admitting: Primary Care

## 2023-09-29 ENCOUNTER — Encounter: Payer: Self-pay | Admitting: Primary Care

## 2023-09-29 VITALS — BP 116/68 | HR 84 | Temp 96.6°F | Ht 65.0 in | Wt 203.0 lb

## 2023-09-29 DIAGNOSIS — Z1231 Encounter for screening mammogram for malignant neoplasm of breast: Secondary | ICD-10-CM

## 2023-09-29 DIAGNOSIS — E782 Mixed hyperlipidemia: Secondary | ICD-10-CM

## 2023-09-29 DIAGNOSIS — E1169 Type 2 diabetes mellitus with other specified complication: Secondary | ICD-10-CM

## 2023-09-29 DIAGNOSIS — N951 Menopausal and female climacteric states: Secondary | ICD-10-CM | POA: Diagnosis not present

## 2023-09-29 DIAGNOSIS — E1165 Type 2 diabetes mellitus with hyperglycemia: Secondary | ICD-10-CM

## 2023-09-29 DIAGNOSIS — F411 Generalized anxiety disorder: Secondary | ICD-10-CM | POA: Diagnosis not present

## 2023-09-29 DIAGNOSIS — Z0000 Encounter for general adult medical examination without abnormal findings: Secondary | ICD-10-CM | POA: Diagnosis not present

## 2023-09-29 DIAGNOSIS — D649 Anemia, unspecified: Secondary | ICD-10-CM | POA: Diagnosis not present

## 2023-09-29 DIAGNOSIS — Z7984 Long term (current) use of oral hypoglycemic drugs: Secondary | ICD-10-CM | POA: Diagnosis not present

## 2023-09-29 DIAGNOSIS — I152 Hypertension secondary to endocrine disorders: Secondary | ICD-10-CM

## 2023-09-29 DIAGNOSIS — F339 Major depressive disorder, recurrent, unspecified: Secondary | ICD-10-CM

## 2023-09-29 DIAGNOSIS — E1159 Type 2 diabetes mellitus with other circulatory complications: Secondary | ICD-10-CM | POA: Diagnosis not present

## 2023-09-29 LAB — LIPID PANEL
Cholesterol: 189 mg/dL (ref 0–200)
HDL: 47.7 mg/dL (ref >39.00)
LDL Cholesterol: 98 mg/dL (ref 0–99)
NonHDL: 141.74
Total CHOL/HDL Ratio: 4
Triglycerides: 221 mg/dL — ABNORMAL HIGH (ref 0.0–149.0)
VLDL: 44.2 mg/dL — ABNORMAL HIGH (ref 0.0–40.0)

## 2023-09-29 LAB — IBC + FERRITIN
Ferritin: 51.8 ng/mL (ref 10.0–291.0)
Iron: 87 ug/dL (ref 42–145)
Saturation Ratios: 23.1 % (ref 20.0–50.0)
TIBC: 376.6 ug/dL (ref 250.0–450.0)
Transferrin: 269 mg/dL (ref 212.0–360.0)

## 2023-09-29 LAB — CBC
HCT: 32.8 % — ABNORMAL LOW (ref 36.0–46.0)
Hemoglobin: 10.9 g/dL — ABNORMAL LOW (ref 12.0–15.0)
MCHC: 33.2 g/dL (ref 30.0–36.0)
MCV: 93.6 fl (ref 78.0–100.0)
Platelets: 215 10*3/uL (ref 150.0–400.0)
RBC: 3.51 Mil/uL — ABNORMAL LOW (ref 3.87–5.11)
RDW: 13.5 % (ref 11.5–15.5)
WBC: 8.2 10*3/uL (ref 4.0–10.5)

## 2023-09-29 LAB — COMPREHENSIVE METABOLIC PANEL WITH GFR
ALT: 19 U/L (ref 0–35)
AST: 16 U/L (ref 0–37)
Albumin: 4.2 g/dL (ref 3.5–5.2)
Alkaline Phosphatase: 52 U/L (ref 39–117)
BUN: 23 mg/dL (ref 6–23)
CO2: 27 meq/L (ref 19–32)
Calcium: 9.2 mg/dL (ref 8.4–10.5)
Chloride: 101 meq/L (ref 96–112)
Creatinine, Ser: 1.22 mg/dL — ABNORMAL HIGH (ref 0.40–1.20)
GFR: 51.7 mL/min — ABNORMAL LOW (ref >60.00)
Glucose, Bld: 176 mg/dL — ABNORMAL HIGH (ref 70–99)
Potassium: 4.7 meq/L (ref 3.5–5.1)
Sodium: 138 meq/L (ref 135–145)
Total Bilirubin: 0.8 mg/dL (ref 0.2–1.2)
Total Protein: 7 g/dL (ref 6.0–8.3)

## 2023-09-29 LAB — HEMOGLOBIN A1C: Hgb A1c MFr Bld: 8.2 % — ABNORMAL HIGH (ref 4.6–6.5)

## 2023-09-29 MED ORDER — LISINOPRIL 10 MG PO TABS
10.0000 mg | ORAL_TABLET | Freq: Every day | ORAL | 3 refills | Status: AC
  Filled 2023-09-29: qty 90, 90d supply, fill #0
  Filled 2024-01-04: qty 90, 90d supply, fill #1
  Filled 2024-04-18: qty 90, 90d supply, fill #2

## 2023-09-29 NOTE — Assessment & Plan Note (Signed)
Improved!  Continue venlafaxine ER 75 mg daily. Continue to monitor.

## 2023-09-29 NOTE — Patient Instructions (Signed)
 Stop by the lab prior to leaving today. I will notify you of your results once received.   [facility] to schedule your [facility].   It was a pleasure to see you today!

## 2023-09-29 NOTE — Assessment & Plan Note (Signed)
 Repeat lipid panel pending. Continue atorvastatin 40 mg daily.

## 2023-09-29 NOTE — Assessment & Plan Note (Signed)
Immunizations UTD. Mammogram due, orders placed. Colonoscopy UTD, due 2030  Discussed the importance of a healthy diet and regular exercise in order for weight loss, and to reduce the risk of further co-morbidity.  Exam stable. Labs pending.  Follow up in 1 year for repeat physical.

## 2023-09-29 NOTE — Assessment & Plan Note (Signed)
 Following with endocrinology, reviewed labs and office notes from October 2024 through Care Everywhere.  Continue metformin  ER 500 mg BID, Ozempic  0.5 mg weekly, Actos  30 mg daily.

## 2023-09-29 NOTE — Progress Notes (Signed)
 Subjective:    Patient ID: Erin Marquez, female    DOB: 1971/01/16, 52 y.o.   MRN: [id number]  HPI  Erin Marquez is a very pleasant 53 y.o. female who presents today for complete physical and follow up of chronic conditions.  Immunizations: -Tetanus: Completed in 2016  -Shingles: Completed Shingrix  series  Diet: Fair diet.  Exercise: No regular exercise.  Eye exam: Completes annually  Dental exam: Completes semi-annually    Pap Smear: Completed in 2021, hysterectomy in 2023 Mammogram: Completed in June 2024  Colonoscopy: Completed in 2023, due 2030  BP Readings from Last 3 Encounters:  09/29/23 116/68  09/13/23 130/84  12/29/22 112/75      Review of Systems  Constitutional:  Negative for unexpected weight change.  HENT:  Negative for rhinorrhea.   Respiratory:  Negative for cough and shortness of breath.   Cardiovascular:  Negative for chest pain.  Gastrointestinal:  Negative for constipation and diarrhea.  Genitourinary:  Negative for difficulty urinating.       Hot flashes   Musculoskeletal:  Negative for arthralgias and myalgias.  Skin:  Negative for rash.  Allergic/Immunologic: Negative for environmental allergies.  Neurological:  Negative for dizziness and headaches.  Psychiatric/Behavioral:  The patient is not nervous/anxious.          Past Medical History:  Diagnosis Date   Anxiety    Arthritis    Chronic neck pain 07/14/2020   Depression    PMH   Diabetes mellitus without complication (HCC)    type 2-on meds   Dog bite    left hand   History of kidney stones    Hyperlipidemia    Hypertension    Polycystic ovarian syndrome    Seasonal allergies     Social History   Socioeconomic History   Marital status: Married    Spouse name: Not on file   Number of children: Not on file   Years of education: Not on file   Highest education level: Some college, no degree  Occupational History   Not on file  Tobacco Use   Smoking status: Never    Smokeless tobacco: Never  Vaping Use   Vaping status: Never Used  Substance and Sexual Activity   Alcohol use: Not Currently    Alcohol/week: 0.0 - 1.0 standard drinks of alcohol    Comment: Monthly   Drug use: No   Sexual activity: Yes    Birth control/protection: None, Surgical    Comment: vasectomy  Other Topics Concern   Not on file  Social History Narrative   Not on file   Social Drivers of Health   Financial Resource Strain: Low Risk  (09/28/2023)   Overall Financial Resource Strain (CARDIA)    Difficulty of Paying Living Expenses: Not hard at all  Food Insecurity: No Food Insecurity (09/28/2023)   Hunger Vital Sign    Worried About Running Out of Food in the Last Year: Never true    Ran Out of Food in the Last Year: Never true  Transportation Needs: No Transportation Needs (09/28/2023)   PRAPARE - Administrator, Civil Service (Medical): No    Lack of Transportation (Non-Medical): No  Physical Activity: Unknown (09/28/2023)   Exercise Vital Sign    Days of Exercise per Week: 0 days    Minutes of Exercise per Session: Not on file  Stress: No Stress Concern Present (09/28/2023)   [employer] of Occupational Health - Occupational Stress Questionnaire    Feeling of  Stress : Only a little  Social Connections: Moderately Isolated (09/28/2023)   Social Connection and Isolation Panel [NHANES]    Frequency of Communication with Friends and Family: More than three times a week    Frequency of Social Gatherings with Friends and Family: Twice a week    Attends Religious Services: Never    Database administrator or Organizations: No    Attends Engineer, structural: Not on file    Marital Status: Married  Catering manager Violence: Not on file    Past Surgical History:  Procedure Laterality Date   CERVICAL FUSION  07/2020   CYSTOSCOPY/URETEROSCOPY/HOLMIUM LASER/STENT PLACEMENT Left 03/22/2019   Procedure: CYSTOSCOPY/URETEROSCOPY/STENT PLACEMENT/ BASKET  STONE EXTRACTION/ LEFT RETROGRADE PYELOGRAM;  Surgeon: Adelbert Homans, MD;  Location: [facility];  Service: Urology;  Laterality: Left;   HYMENECTOMY N/A 12/05/2018   Procedure: Vaginoplasty;  Surgeon: Granville Layer, MD;  Location: [facility];  Service: Gynecology;  Laterality: N/A;  Please have four 2-0 Vicryl on a CT-1 and of 0.25% Marcaine    HYSTERECTOMY ABDOMINAL WITH SALPINGECTOMY Bilateral 11/2020   I & D EXTREMITY Left 08/10/2019   Procedure: IRRIGATION AND DEBRIDEMENT LEFT SMALL FINGER AND LEFT HAND WOUNDS, PERCUTANEOUS PINNING LEFT SMALL FINGER;  Surgeon: Oralia Bills, MD;  Location: [facility] OR;  Service: Orthopedics;  Laterality: Left;   VAGINA SURGERY  2010   extra skin removed   WISDOM TOOTH EXTRACTION      Family History  Problem Relation Age of Onset   Hypertension Mother    Stroke Mother    Diabetes Father    Hyperthyroidism Father    Prostate cancer Father    Anxiety disorder Daughter    Colon cancer Neg Hx    Colon polyps Neg Hx    Esophageal cancer Neg Hx    Stomach cancer Neg Hx    Rectal cancer Neg Hx     Allergies  Allergen Reactions   Peanut Oil Anaphylaxis    Slurred speech Slurred speech   Codeine Nausea Only and Nausea And Vomiting   Doxycycline  Hyclate     Causes severe GI upset. unable to tolerate    Current Outpatient Medications on File Prior to Visit  Medication Sig Dispense Refill   atorvastatin  (LIPITOR) 40 MG tablet Take 1 tablet (40 mg total) by mouth daily. for cholesterol. 90 tablet 3   blood glucose meter kit and supplies KIT Dispense based on patient and insurance preference. Use up to four times daily as directed. (FOR ICD-9 250.00, 250.01). 1 each 0   Blood Glucose Monitoring Suppl (FREESTYLE LITE) DEVI      Cholecalciferol (VITAMIN D3) 5000 units CAPS Take 1 capsule by mouth daily.     cyanocobalamin  1000 MCG tablet Take 1,000 mcg by mouth daily.     cyclobenzaprine  (FLEXERIL ) 10  MG tablet take 1 tablet by mouth every 8 hours as needed 40 tablet 0   fexofenadine (ALLEGRA) 60 MG tablet Take 1 tablet by mouth daily.     fluticasone  (FLONASE ) 50 MCG/ACT nasal spray Place 2 sprays into both nostrils daily. 16 g 6   glucose blood (FREESTYLE LITE) test strip Use as instructed to test blood sugar up to 4 times daily 300 each 2   ketorolac  (TORADOL ) 10 MG tablet Take 1 tablet (10 mg total) by mouth every 6 (six) hours as needed. 20 tablet 0   Lancets (FREESTYLE) lancets      metFORMIN  (GLUCOPHAGE -XR) 500 MG 24  hr tablet Take 2 tablets by mouth with breakfast and 1 tablet by mouth after dinner. 270 tablet 3   Multiple Vitamin (MULTIVITAMIN PO) Take 1 capsule by mouth daily.     OZEMPIC , 0.25 OR 0.5 MG/DOSE, 2 MG/3ML SOPN Inject 0.5 mg into the skin once a week. 3 mL 3   pioglitazone  (ACTOS ) 30 MG tablet Take 1 tablet by mouth daily. 90 tablet 3   triamcinolone  cream (KENALOG ) 0.1 % Apply topically 2 times daily as needed. 30 g 0   venlafaxine  XR (EFFEXOR  XR) 75 MG 24 hr capsule Take 1 capsule (75 mg total) by mouth daily with breakfast. for anxiety and depression. 90 capsule 0   No current facility-administered medications on file prior to visit.    BP 116/68   Pulse 84   Temp (!) 96.6 F (35.9 C) (Temporal)   Ht 5\' 5"  (1.651 m)   Wt 203 lb (92.1 kg)   LMP 10/27/2020   SpO2 100%   BMI 33.78 kg/m  Objective:   Physical Exam HENT:     Right Ear: Tympanic membrane and ear canal normal.     Left Ear: Tympanic membrane and ear canal normal.  Eyes:     Pupils: Pupils are equal, round, and reactive to light.  Cardiovascular:     Rate and Rhythm: Normal rate and regular rhythm.  Pulmonary:     Effort: Pulmonary effort is normal.     Breath sounds: Normal breath sounds.  Abdominal:     General: Bowel sounds are normal.     Palpations: Abdomen is soft.     Tenderness: There is no abdominal tenderness.  Musculoskeletal:        General: Normal range of motion.      Cervical back: Neck supple.  Skin:    General: Skin is warm and dry.  Neurological:     Mental Status: She is alert and oriented to person, place, and time.     Cranial Nerves: No cranial nerve deficit.     Deep Tendon Reflexes:     Reflex Scores:      Patellar reflexes are 2+ on the right side and 2+ on the left side. Psychiatric:        Mood and Affect: Mood normal.           Assessment & Plan:  Type 2 diabetes mellitus with hyperglycemia, without long-term current use of insulin  [facility]) Assessment & Plan: Following with endocrinology, reviewed labs and office notes from October 2024 through [facility].  Continue metformin  ER 500 mg BID, Ozempic  0.5 mg weekly, Actos  30 mg daily.    Orders: -     Hemoglobin A1c  Hypertension associated with diabetes (HCC) Assessment & Plan: Controlled.  Continue lisinopril  10 mg daily.  Orders: -     CBC -     Lisinopril ; Take 1 tablet by mouth daily for blood pressure.  Dispense: 90 tablet; Refill: 3  Mixed diabetic hyperlipidemia associated with type 2 diabetes mellitus (HCC) Assessment & Plan: Repeat lipid panel pending.  Continue atorvastatin  40 mg daily.  Orders: -     Comprehensive metabolic panel with GFR -     Lipid panel  GAD (generalized anxiety disorder) Assessment & Plan: Improved.  Continue venlafaxine  ER 75 mg daily.  Continue to monitor.    Vasomotor symptoms due to menopause Assessment & Plan: Waxes and wanes.  Continue venlafaxine  ER 75 mg daily.   Depression, recurrent (HCC) Assessment & Plan: Improved.  Continue venlafaxine   ER 75 mg daily.  Continue to monitor.    Screening mammogram for breast cancer -     3D Screening Mammogram, Left and Right; Future  Preventative health care Assessment & Plan: Immunizations UTD.  Mammogram due, orders placed. Colonoscopy UTD, due 2030  Discussed the importance of a healthy diet and regular exercise in order for weight loss, and to reduce the  risk of further co-morbidity.  Exam stable. Labs pending.  Follow up in 1 year for repeat physical.          Nicolle Heward K Sonnia Strong, NP

## 2023-09-29 NOTE — Assessment & Plan Note (Signed)
 Waxes and wanes.  Continue venlafaxine  ER 75 mg daily.

## 2023-09-29 NOTE — Assessment & Plan Note (Signed)
 Controlled  Continue lisinopril 10 mg daily

## 2023-10-03 ENCOUNTER — Other Ambulatory Visit: Payer: Self-pay | Admitting: Primary Care

## 2023-10-03 ENCOUNTER — Ambulatory Visit: Payer: Self-pay | Admitting: Primary Care

## 2023-10-03 DIAGNOSIS — D649 Anemia, unspecified: Secondary | ICD-10-CM

## 2023-10-17 ENCOUNTER — Other Ambulatory Visit: Payer: Self-pay | Admitting: Primary Care

## 2023-10-17 ENCOUNTER — Other Ambulatory Visit (HOSPITAL_COMMUNITY): Payer: Self-pay

## 2023-10-17 DIAGNOSIS — F339 Major depressive disorder, recurrent, unspecified: Secondary | ICD-10-CM

## 2023-10-17 DIAGNOSIS — F411 Generalized anxiety disorder: Secondary | ICD-10-CM

## 2023-10-17 DIAGNOSIS — E1169 Type 2 diabetes mellitus with other specified complication: Secondary | ICD-10-CM

## 2023-10-17 MED ORDER — VENLAFAXINE HCL ER 75 MG PO CP24
75.0000 mg | ORAL_CAPSULE | Freq: Every day | ORAL | 2 refills | Status: AC
  Filled 2023-10-17 – 2023-11-28 (×2): qty 90, 90d supply, fill #0

## 2023-10-17 MED ORDER — ATORVASTATIN CALCIUM 40 MG PO TABS
40.0000 mg | ORAL_TABLET | Freq: Every day | ORAL | 2 refills | Status: AC
  Filled 2023-10-17: qty 90, 90d supply, fill #0
  Filled 2024-01-24: qty 90, 90d supply, fill #1
  Filled 2024-04-24: qty 90, 90d supply, fill #2

## 2023-10-20 ENCOUNTER — Other Ambulatory Visit (HOSPITAL_COMMUNITY): Payer: Self-pay

## 2023-10-20 ENCOUNTER — Other Ambulatory Visit: Payer: Self-pay

## 2023-10-20 MED ORDER — PIOGLITAZONE HCL 30 MG PO TABS
30.0000 mg | ORAL_TABLET | Freq: Every day | ORAL | 1 refills | Status: DC
  Filled 2023-10-20: qty 90, 90d supply, fill #0
  Filled 2024-01-24: qty 90, 90d supply, fill #1

## 2023-10-20 MED ORDER — METFORMIN HCL ER 500 MG PO TB24
ORAL_TABLET | Freq: Two times a day (BID) | ORAL | 1 refills | Status: DC
  Filled 2023-10-20: qty 270, 90d supply, fill #0
  Filled 2024-02-21: qty 270, 90d supply, fill #1

## 2023-10-27 DIAGNOSIS — H52223 Regular astigmatism, bilateral: Secondary | ICD-10-CM | POA: Diagnosis not present

## 2023-10-27 DIAGNOSIS — H5213 Myopia, bilateral: Secondary | ICD-10-CM | POA: Diagnosis not present

## 2023-10-27 DIAGNOSIS — E119 Type 2 diabetes mellitus without complications: Secondary | ICD-10-CM | POA: Diagnosis not present

## 2023-10-27 DIAGNOSIS — H524 Presbyopia: Secondary | ICD-10-CM | POA: Diagnosis not present

## 2023-10-27 LAB — HM DIABETES EYE EXAM

## 2023-10-31 ENCOUNTER — Other Ambulatory Visit (HOSPITAL_COMMUNITY): Payer: Self-pay

## 2023-10-31 DIAGNOSIS — M542 Cervicalgia: Secondary | ICD-10-CM | POA: Diagnosis not present

## 2023-10-31 MED ORDER — CYCLOBENZAPRINE HCL 10 MG PO TABS
10.0000 mg | ORAL_TABLET | Freq: Three times a day (TID) | ORAL | 0 refills | Status: AC | PRN
  Filled 2023-10-31: qty 40, 14d supply, fill #0

## 2023-11-02 ENCOUNTER — Ambulatory Visit
Admission: RE | Admit: 2023-11-02 | Discharge: 2023-11-02 | Disposition: A | Discharge status: Home / Self Care | Source: Ambulatory Visit | Attending: Primary Care | Admitting: Primary Care

## 2023-11-02 DIAGNOSIS — Z1231 Encounter for screening mammogram for malignant neoplasm of breast: Secondary | ICD-10-CM | POA: Diagnosis not present

## 2023-11-13 ENCOUNTER — Other Ambulatory Visit (HOSPITAL_COMMUNITY): Payer: Self-pay

## 2023-11-28 ENCOUNTER — Other Ambulatory Visit (HOSPITAL_COMMUNITY): Payer: Self-pay

## 2023-12-12 ENCOUNTER — Other Ambulatory Visit (HOSPITAL_COMMUNITY): Payer: Self-pay

## 2023-12-13 ENCOUNTER — Other Ambulatory Visit (HOSPITAL_COMMUNITY): Payer: Self-pay

## 2023-12-13 MED ORDER — OZEMPIC (0.25 OR 0.5 MG/DOSE) 2 MG/3ML ~~LOC~~ SOPN
0.5000 mg | PEN_INJECTOR | SUBCUTANEOUS | 2 refills | Status: DC
  Filled 2023-12-13: qty 3, 28d supply, fill #0
  Filled 2024-01-04 – 2024-01-05 (×2): qty 3, 28d supply, fill #1
  Filled 2024-02-06: qty 3, 28d supply, fill #2

## 2024-01-04 ENCOUNTER — Other Ambulatory Visit (HOSPITAL_COMMUNITY): Payer: Self-pay

## 2024-01-05 ENCOUNTER — Other Ambulatory Visit: Payer: Self-pay

## 2024-01-27 ENCOUNTER — Other Ambulatory Visit (HOSPITAL_COMMUNITY): Payer: Self-pay

## 2024-01-27 MED ORDER — FLUZONE 0.5 ML IM SUSY
0.5000 mL | PREFILLED_SYRINGE | Freq: Once | INTRAMUSCULAR | 0 refills | Status: AC
Start: 2024-01-27 — End: 2024-01-28
  Filled 2024-01-27: qty 0.5, 1d supply, fill #0

## 2024-02-21 ENCOUNTER — Other Ambulatory Visit (HOSPITAL_COMMUNITY): Payer: Self-pay

## 2024-03-04 ENCOUNTER — Other Ambulatory Visit: Payer: Self-pay

## 2024-03-04 ENCOUNTER — Other Ambulatory Visit (HOSPITAL_COMMUNITY): Payer: Self-pay

## 2024-03-04 DIAGNOSIS — D649 Anemia, unspecified: Secondary | ICD-10-CM | POA: Diagnosis not present

## 2024-03-04 DIAGNOSIS — E1165 Type 2 diabetes mellitus with hyperglycemia: Secondary | ICD-10-CM | POA: Diagnosis not present

## 2024-03-04 DIAGNOSIS — Z780 Asymptomatic menopausal state: Secondary | ICD-10-CM | POA: Diagnosis not present

## 2024-03-04 MED ORDER — PIOGLITAZONE HCL 30 MG PO TABS
30.0000 mg | ORAL_TABLET | Freq: Every day | ORAL | 1 refills | Status: AC
  Filled 2024-03-04 – 2024-05-08 (×2): qty 90, 90d supply, fill #0

## 2024-03-04 MED ORDER — METFORMIN HCL ER 500 MG PO TB24
1000.0000 mg | ORAL_TABLET | ORAL | 3 refills | Status: AC
  Filled 2024-03-04 – 2024-05-27 (×2): qty 270, 90d supply, fill #0

## 2024-03-04 MED ORDER — OZEMPIC (0.25 OR 0.5 MG/DOSE) 2 MG/3ML ~~LOC~~ SOPN
0.5000 mg | PEN_INJECTOR | SUBCUTANEOUS | 2 refills | Status: DC
  Filled 2024-03-04: qty 3, 28d supply, fill #0
  Filled 2024-03-28: qty 3, 28d supply, fill #1
  Filled 2024-04-25: qty 3, 28d supply, fill #2

## 2024-04-24 ENCOUNTER — Other Ambulatory Visit: Payer: Self-pay

## 2024-05-08 ENCOUNTER — Other Ambulatory Visit (HOSPITAL_COMMUNITY): Payer: Self-pay

## 2024-05-16 ENCOUNTER — Other Ambulatory Visit: Payer: Self-pay

## 2024-05-20 ENCOUNTER — Other Ambulatory Visit: Payer: Self-pay

## 2024-05-27 ENCOUNTER — Other Ambulatory Visit (HOSPITAL_COMMUNITY): Payer: Self-pay

## 2024-05-28 ENCOUNTER — Other Ambulatory Visit (HOSPITAL_COMMUNITY): Payer: Self-pay

## 2024-05-28 MED ORDER — OZEMPIC (0.25 OR 0.5 MG/DOSE) 2 MG/3ML ~~LOC~~ SOPN
0.5000 mg | PEN_INJECTOR | SUBCUTANEOUS | 2 refills | Status: AC
  Filled 2024-05-28: qty 3, 28d supply, fill #0
